# Patient Record
Sex: Female | Born: 1945
Health system: Southern US, Community
[De-identification: ages and names within clinical notes are randomized; demographics above are authoritative.]

## PROBLEM LIST (undated history)

## (undated) DIAGNOSIS — R932 Abnormal findings on diagnostic imaging of liver and biliary tract: Secondary | ICD-10-CM

## (undated) DIAGNOSIS — E559 Vitamin D deficiency, unspecified: Secondary | ICD-10-CM

## (undated) DIAGNOSIS — R740 Nonspecific elevation of levels of transaminase and lactic acid dehydrogenase [LDH]: Secondary | ICD-10-CM

## (undated) DIAGNOSIS — D490 Neoplasm of unspecified behavior of digestive system: Secondary | ICD-10-CM

## (undated) DIAGNOSIS — IMO0001 Reserved for inherently not codable concepts without codable children: Secondary | ICD-10-CM

## (undated) DIAGNOSIS — E119 Type 2 diabetes mellitus without complications: Secondary | ICD-10-CM

## (undated) DIAGNOSIS — R7989 Other specified abnormal findings of blood chemistry: Secondary | ICD-10-CM

## (undated) DIAGNOSIS — R7611 Nonspecific reaction to tuberculin skin test without active tuberculosis: Secondary | ICD-10-CM

## (undated) DIAGNOSIS — E785 Hyperlipidemia, unspecified: Secondary | ICD-10-CM

## (undated) DIAGNOSIS — M1990 Unspecified osteoarthritis, unspecified site: Secondary | ICD-10-CM

## (undated) DIAGNOSIS — E039 Hypothyroidism, unspecified: Secondary | ICD-10-CM

## (undated) DIAGNOSIS — T7840XA Allergy, unspecified, initial encounter: Secondary | ICD-10-CM

## (undated) DIAGNOSIS — K219 Gastro-esophageal reflux disease without esophagitis: Secondary | ICD-10-CM

## (undated) DIAGNOSIS — C801 Malignant (primary) neoplasm, unspecified: Secondary | ICD-10-CM

## (undated) DIAGNOSIS — R945 Abnormal results of liver function studies: Secondary | ICD-10-CM

## (undated) HISTORY — DX: Type 2 diabetes mellitus without complications: E11.9

## (undated) HISTORY — DX: Nonspecific reaction to tuberculin skin test without active tuberculosis: R76.11

## (undated) HISTORY — DX: Malignant (primary) neoplasm, unspecified: C80.1

## (undated) HISTORY — DX: Neoplasm of unspecified behavior of digestive system: D49.0

## (undated) HISTORY — DX: Abnormal results of liver function studies: R94.5

## (undated) HISTORY — DX: Unspecified osteoarthritis, unspecified site: M19.90

## (undated) HISTORY — DX: Hypothyroidism, unspecified: E03.9

## (undated) HISTORY — DX: Vitamin D deficiency, unspecified: E55.9

## (undated) HISTORY — PX: POLYPECTOMY: SHX149

## (undated) HISTORY — DX: Abnormal findings on diagnostic imaging of liver and biliary tract: R93.2

## (undated) HISTORY — DX: Hyperlipidemia, unspecified: E78.5

## (undated) HISTORY — DX: Allergy, unspecified, initial encounter: T78.40XA

## (undated) HISTORY — PX: COLONOSCOPY: SHX174

## (undated) HISTORY — DX: Gastro-esophageal reflux disease without esophagitis: K21.9

## (undated) HISTORY — DX: Reserved for inherently not codable concepts without codable children: IMO0001

## (undated) HISTORY — DX: Nonspecific elevation of levels of transaminase and lactic acid dehydrogenase (ldh): R74.0

## (undated) HISTORY — DX: Other specified abnormal findings of blood chemistry: R79.89

---

## 1994-04-11 DIAGNOSIS — D490 Neoplasm of unspecified behavior of digestive system: Secondary | ICD-10-CM

## 1994-04-11 DIAGNOSIS — C801 Malignant (primary) neoplasm, unspecified: Secondary | ICD-10-CM

## 1994-04-11 HISTORY — DX: Neoplasm of unspecified behavior of digestive system: D49.0

## 1994-04-11 HISTORY — PX: SALIVARY GLAND SURGERY: SHX768

## 1994-04-11 HISTORY — DX: Malignant (primary) neoplasm, unspecified: C80.1

## 2001-03-01 ENCOUNTER — Other Ambulatory Visit: Admission: RE | Admit: 2001-03-01 | Discharge: 2001-03-01 | Payer: Self-pay | Admitting: Internal Medicine

## 2001-03-14 ENCOUNTER — Encounter: Payer: Self-pay | Admitting: Internal Medicine

## 2001-04-24 ENCOUNTER — Encounter: Payer: Self-pay | Admitting: Internal Medicine

## 2002-04-17 ENCOUNTER — Encounter: Payer: Self-pay | Admitting: Internal Medicine

## 2003-04-12 DIAGNOSIS — IMO0001 Reserved for inherently not codable concepts without codable children: Secondary | ICD-10-CM

## 2003-04-12 HISTORY — DX: Reserved for inherently not codable concepts without codable children: IMO0001

## 2003-05-13 ENCOUNTER — Encounter: Payer: Self-pay | Admitting: Internal Medicine

## 2003-05-22 ENCOUNTER — Encounter: Payer: Self-pay | Admitting: Internal Medicine

## 2003-08-06 ENCOUNTER — Encounter: Payer: Self-pay | Admitting: Internal Medicine

## 2003-08-06 ENCOUNTER — Ambulatory Visit (HOSPITAL_COMMUNITY): Admission: RE | Admit: 2003-08-06 | Discharge: 2003-08-06 | Payer: Self-pay | Admitting: Internal Medicine

## 2003-09-02 ENCOUNTER — Encounter: Payer: Self-pay | Admitting: Internal Medicine

## 2003-09-02 ENCOUNTER — Encounter: Admission: RE | Admit: 2003-09-02 | Discharge: 2003-09-02 | Payer: Self-pay | Admitting: Internal Medicine

## 2003-09-02 ENCOUNTER — Encounter (INDEPENDENT_AMBULATORY_CARE_PROVIDER_SITE_OTHER): Payer: Self-pay | Admitting: *Deleted

## 2005-05-12 ENCOUNTER — Ambulatory Visit: Payer: Self-pay | Admitting: Internal Medicine

## 2005-05-17 ENCOUNTER — Ambulatory Visit: Payer: Self-pay | Admitting: Internal Medicine

## 2005-05-24 ENCOUNTER — Ambulatory Visit: Payer: Self-pay | Admitting: Internal Medicine

## 2005-08-15 ENCOUNTER — Ambulatory Visit: Payer: Self-pay | Admitting: Internal Medicine

## 2005-08-22 ENCOUNTER — Ambulatory Visit: Payer: Self-pay | Admitting: Internal Medicine

## 2005-09-02 ENCOUNTER — Ambulatory Visit (HOSPITAL_COMMUNITY): Admission: RE | Admit: 2005-09-02 | Discharge: 2005-09-02 | Payer: Self-pay | Admitting: Internal Medicine

## 2005-10-04 ENCOUNTER — Ambulatory Visit: Payer: Self-pay | Admitting: Internal Medicine

## 2006-02-22 ENCOUNTER — Ambulatory Visit: Payer: Self-pay | Admitting: Internal Medicine

## 2006-02-22 LAB — CONVERTED CEMR LAB
ALT: 28 units/L (ref 0–40)
AST: 25 units/L (ref 0–37)
Albumin: 4.1 g/dL (ref 3.5–5.2)
BUN: 11 mg/dL (ref 6–23)
Basophils Absolute: 0 10*3/uL (ref 0.0–0.1)
Eosinophil percent: 4 % (ref 0.0–5.0)
GFR calc non Af Amer: 78 mL/min
HCT: 45.1 % (ref 36.0–46.0)
Hemoglobin: 15.1 g/dL — ABNORMAL HIGH (ref 12.0–15.0)
Lymphocytes Relative: 33.9 % (ref 12.0–46.0)
MCV: 91.8 fL (ref 78.0–100.0)
Monocytes Absolute: 0.4 10*3/uL (ref 0.2–0.7)
Neutrophils Relative %: 56.1 % (ref 43.0–77.0)
Potassium: 3.6 meq/L (ref 3.5–5.1)
Total Bilirubin: 0.9 mg/dL (ref 0.3–1.2)
WBC: 7 10*3/uL (ref 4.5–10.5)

## 2006-03-21 ENCOUNTER — Ambulatory Visit: Payer: Self-pay | Admitting: Internal Medicine

## 2006-03-28 ENCOUNTER — Encounter: Admission: RE | Admit: 2006-03-28 | Discharge: 2006-06-26 | Payer: Self-pay | Admitting: Internal Medicine

## 2006-06-14 ENCOUNTER — Ambulatory Visit: Payer: Self-pay | Admitting: Internal Medicine

## 2006-06-14 LAB — CONVERTED CEMR LAB
ALT: 33 units/L (ref 0–40)
AST: 33 units/L (ref 0–37)
BUN: 14 mg/dL (ref 6–23)
Basophils Absolute: 0 10*3/uL (ref 0.0–0.1)
Basophils Relative: 0.2 % (ref 0.0–1.0)
Bilirubin, Direct: 0.1 mg/dL (ref 0.0–0.3)
Calcium: 9.3 mg/dL (ref 8.4–10.5)
Creatinine,U: 53.6 mg/dL
GFR calc Af Amer: 468 mL/min
Glucose, Bld: 109 mg/dL — ABNORMAL HIGH (ref 70–99)
HCT: 44.7 % (ref 36.0–46.0)
Lymphocytes Relative: 45.1 % (ref 12.0–46.0)
MCHC: 33.8 g/dL (ref 30.0–36.0)
MCV: 91.1 fL (ref 78.0–100.0)
Microalb Creat Ratio: 11.2 mg/g (ref 0.0–30.0)
Microalb, Ur: 0.6 mg/dL (ref 0.0–1.9)
Monocytes Absolute: 0.3 10*3/uL (ref 0.2–0.7)
Neutro Abs: 2.7 10*3/uL (ref 1.4–7.7)
Neutrophils Relative %: 46.1 % (ref 43.0–77.0)
RBC: 4.91 M/uL (ref 3.87–5.11)
Total Bilirubin: 1.1 mg/dL (ref 0.3–1.2)
WBC: 5.8 10*3/uL (ref 4.5–10.5)

## 2006-06-27 ENCOUNTER — Ambulatory Visit: Payer: Self-pay | Admitting: Internal Medicine

## 2006-09-07 DIAGNOSIS — M1990 Unspecified osteoarthritis, unspecified site: Secondary | ICD-10-CM

## 2006-09-07 DIAGNOSIS — R7989 Other specified abnormal findings of blood chemistry: Secondary | ICD-10-CM | POA: Insufficient documentation

## 2006-09-07 DIAGNOSIS — E785 Hyperlipidemia, unspecified: Secondary | ICD-10-CM | POA: Insufficient documentation

## 2006-09-07 DIAGNOSIS — K219 Gastro-esophageal reflux disease without esophagitis: Secondary | ICD-10-CM

## 2006-09-07 DIAGNOSIS — R945 Abnormal results of liver function studies: Secondary | ICD-10-CM | POA: Insufficient documentation

## 2006-09-07 HISTORY — DX: Other specified abnormal findings of blood chemistry: R79.89

## 2006-09-07 HISTORY — DX: Abnormal results of liver function studies: R94.5

## 2006-09-07 HISTORY — DX: Gastro-esophageal reflux disease without esophagitis: K21.9

## 2006-09-07 HISTORY — DX: Hyperlipidemia, unspecified: E78.5

## 2006-09-07 HISTORY — DX: Unspecified osteoarthritis, unspecified site: M19.90

## 2006-10-23 ENCOUNTER — Ambulatory Visit: Payer: Self-pay | Admitting: Internal Medicine

## 2006-10-23 LAB — CONVERTED CEMR LAB
Direct LDL: 163.6 mg/dL
HDL: 47.9 mg/dL (ref >39.0)
Hgb A1c MFr Bld: 6.3 % — ABNORMAL HIGH (ref 4.6–6.0)
TSH: 4.95 microintl units/mL (ref 0.35–5.50)
VLDL: 34 mg/dL (ref 0–40)

## 2006-10-30 ENCOUNTER — Ambulatory Visit: Payer: Self-pay | Admitting: Internal Medicine

## 2006-10-30 DIAGNOSIS — E039 Hypothyroidism, unspecified: Secondary | ICD-10-CM

## 2006-10-30 HISTORY — DX: Hypothyroidism, unspecified: E03.9

## 2006-11-07 ENCOUNTER — Encounter: Payer: Self-pay | Admitting: Internal Medicine

## 2007-01-23 ENCOUNTER — Ambulatory Visit: Payer: Self-pay | Admitting: Internal Medicine

## 2007-01-23 LAB — CONVERTED CEMR LAB
Cholesterol: 159 mg/dL (ref 0–200)
HDL: 46.6 mg/dL (ref >39.0)
Triglycerides: 107 mg/dL (ref 0–149)
VLDL: 21 mg/dL (ref 0–40)

## 2007-01-30 ENCOUNTER — Telehealth: Payer: Self-pay | Admitting: *Deleted

## 2007-01-30 LAB — CONVERTED CEMR LAB: Vit D, 1,25-Dihydroxy: 9 — ABNORMAL LOW (ref 30–89)

## 2008-02-04 ENCOUNTER — Telehealth: Payer: Self-pay | Admitting: *Deleted

## 2008-07-16 ENCOUNTER — Telehealth: Payer: Self-pay | Admitting: Internal Medicine

## 2008-07-22 ENCOUNTER — Ambulatory Visit: Payer: Self-pay | Admitting: Internal Medicine

## 2008-07-22 DIAGNOSIS — M25549 Pain in joints of unspecified hand: Secondary | ICD-10-CM | POA: Insufficient documentation

## 2008-07-22 LAB — CONVERTED CEMR LAB
ALT: 91 units/L — ABNORMAL HIGH (ref 0–35)
AST: 77 units/L — ABNORMAL HIGH (ref 0–37)
Alkaline Phosphatase: 60 units/L (ref 39–117)
Anti Nuclear Antibody(ANA): NEGATIVE
Basophils Absolute: 0 10*3/uL (ref 0.0–0.1)
Basophils Relative: 0.5 % (ref 0.0–3.0)
CO2: 29 meq/L (ref 19–32)
CRP, High Sensitivity: 7 — ABNORMAL HIGH (ref 0.00–5.00)
Calcium: 9 mg/dL (ref 8.4–10.5)
Chloride: 106 meq/L (ref 96–112)
Cholesterol: 184 mg/dL (ref 0–200)
Creatinine, Ser: 0.6 mg/dL (ref 0.4–1.2)
Cyclic Citrullin Peptide Ab: 0 units (ref <7)
Free T4: 0.8 ng/dL (ref 0.6–1.6)
GFR calc non Af Amer: 107.98 mL/min (ref >60)
Glucose, Bld: 114 mg/dL — ABNORMAL HIGH (ref 70–99)
HCT: 41.4 % (ref 36.0–46.0)
HDL: 36.3 mg/dL — ABNORMAL LOW (ref >39.00)
Hgb A1c MFr Bld: 6.7 % — ABNORMAL HIGH (ref 4.6–6.5)
Lymphocytes Relative: 41.9 % (ref 12.0–46.0)
Monocytes Relative: 8.7 % (ref 3.0–12.0)
Neutro Abs: 1.9 10*3/uL (ref 1.4–7.7)
Platelets: 202 10*3/uL (ref 150.0–400.0)
Potassium: 3.6 meq/L (ref 3.5–5.1)
RDW: 12.3 % (ref 11.5–14.6)
Rhuematoid fact SerPl-aCnc: <20 intl units/mL (ref 0.0–20.0)
Sodium: 140 meq/L (ref 135–145)
TSH: 6.72 microintl units/mL — ABNORMAL HIGH (ref 0.35–5.50)
Total CHOL/HDL Ratio: 5
Total Protein: 6.6 g/dL (ref 6.0–8.3)

## 2008-07-29 ENCOUNTER — Telehealth (INDEPENDENT_AMBULATORY_CARE_PROVIDER_SITE_OTHER): Payer: Self-pay | Admitting: *Deleted

## 2008-08-28 ENCOUNTER — Ambulatory Visit: Payer: Self-pay | Admitting: Internal Medicine

## 2008-08-28 LAB — CONVERTED CEMR LAB
ALT: 67 units/L — ABNORMAL HIGH (ref 0–35)
Total Protein: 7 g/dL (ref 6.0–8.3)

## 2008-09-03 ENCOUNTER — Ambulatory Visit: Payer: Self-pay | Admitting: Internal Medicine

## 2008-09-09 ENCOUNTER — Telehealth: Payer: Self-pay | Admitting: *Deleted

## 2008-09-16 ENCOUNTER — Ambulatory Visit: Payer: Self-pay | Admitting: Internal Medicine

## 2008-09-16 LAB — CONVERTED CEMR LAB
Ceruloplasmin: 25 mg/dL (ref 21–63)
HCV Ab: NEGATIVE
Hep B C IgM: NEGATIVE

## 2008-09-19 ENCOUNTER — Encounter: Admission: RE | Admit: 2008-09-19 | Discharge: 2008-09-19 | Payer: Self-pay | Admitting: Internal Medicine

## 2008-09-25 ENCOUNTER — Telehealth (INDEPENDENT_AMBULATORY_CARE_PROVIDER_SITE_OTHER): Payer: Self-pay | Admitting: *Deleted

## 2008-10-01 LAB — CONVERTED CEMR LAB
AST: 35 units/L (ref 0–37)
Alkaline Phosphatase: 61 units/L (ref 39–117)
Bilirubin, Direct: 0.2 mg/dL (ref 0.0–0.3)
Saturation Ratios: 26.8 % (ref 20.0–50.0)
Total Bilirubin: 0.7 mg/dL (ref 0.3–1.2)

## 2008-11-14 ENCOUNTER — Ambulatory Visit: Payer: Self-pay | Admitting: Internal Medicine

## 2008-11-14 DIAGNOSIS — R932 Abnormal findings on diagnostic imaging of liver and biliary tract: Secondary | ICD-10-CM | POA: Insufficient documentation

## 2008-11-14 DIAGNOSIS — R7401 Elevation of levels of liver transaminase levels: Secondary | ICD-10-CM | POA: Insufficient documentation

## 2008-11-14 DIAGNOSIS — R740 Nonspecific elevation of levels of transaminase and lactic acid dehydrogenase [LDH]: Secondary | ICD-10-CM

## 2008-11-14 DIAGNOSIS — R7402 Elevation of levels of lactic acid dehydrogenase (LDH): Secondary | ICD-10-CM

## 2008-11-14 HISTORY — DX: Elevation of levels of liver transaminase levels: R74.01

## 2008-11-14 HISTORY — DX: Abnormal findings on diagnostic imaging of liver and biliary tract: R93.2

## 2008-11-14 HISTORY — DX: Elevation of levels of lactic acid dehydrogenase (LDH): R74.02

## 2008-11-14 LAB — CONVERTED CEMR LAB
Anti Nuclear Antibody(ANA): NEGATIVE
Bilirubin, Direct: 0.1 mg/dL (ref 0.0–0.3)
Hepatitis B Surface Ag: NEGATIVE
Tissue Transglutaminase Ab, IgA: 0.4 units (ref <7)

## 2008-12-08 ENCOUNTER — Telehealth: Payer: Self-pay | Admitting: Internal Medicine

## 2009-01-05 ENCOUNTER — Ambulatory Visit: Payer: Self-pay | Admitting: Internal Medicine

## 2009-06-22 ENCOUNTER — Telehealth: Payer: Self-pay | Admitting: *Deleted

## 2009-08-07 ENCOUNTER — Ambulatory Visit: Payer: Self-pay | Admitting: Internal Medicine

## 2009-08-07 LAB — CONVERTED CEMR LAB
ALT: 33 units/L (ref 0–35)
Albumin: 4.4 g/dL (ref 3.5–5.2)
BUN: 12 mg/dL (ref 6–23)
CO2: 31 meq/L (ref 19–32)
Calcium: 9.7 mg/dL (ref 8.4–10.5)
Chloride: 106 meq/L (ref 96–112)
Cholesterol: 148 mg/dL (ref 0–200)
Creatinine, Ser: 0.6 mg/dL (ref 0.4–1.2)
Glucose, Bld: 116 mg/dL — ABNORMAL HIGH (ref 70–99)
HCT: 43.9 % (ref 36.0–46.0)
Hgb A1c MFr Bld: 6.7 % — ABNORMAL HIGH (ref 4.6–6.5)
Lymphocytes Relative: 33.5 % (ref 12.0–46.0)
Lymphs Abs: 2.2 10*3/uL (ref 0.7–4.0)
Monocytes Relative: 5.9 % (ref 3.0–12.0)
Neutro Abs: 3.6 10*3/uL (ref 1.4–7.7)
Neutrophils Relative %: 55.3 % (ref 43.0–77.0)
Platelets: 244 10*3/uL (ref 150.0–400.0)
RBC: 4.74 M/uL (ref 3.87–5.11)
RDW: 13.4 % (ref 11.5–14.6)
Total Bilirubin: 0.7 mg/dL (ref 0.3–1.2)
Total CHOL/HDL Ratio: 3
Triglycerides: 103 mg/dL (ref 0.0–149.0)

## 2009-08-14 ENCOUNTER — Ambulatory Visit: Payer: Self-pay | Admitting: Internal Medicine

## 2009-08-14 DIAGNOSIS — E559 Vitamin D deficiency, unspecified: Secondary | ICD-10-CM | POA: Insufficient documentation

## 2009-08-14 DIAGNOSIS — H1045 Other chronic allergic conjunctivitis: Secondary | ICD-10-CM | POA: Insufficient documentation

## 2009-08-14 HISTORY — DX: Vitamin D deficiency, unspecified: E55.9

## 2009-08-18 ENCOUNTER — Encounter: Payer: Self-pay | Admitting: Internal Medicine

## 2009-08-18 ENCOUNTER — Ambulatory Visit: Payer: Self-pay | Admitting: Family Medicine

## 2009-09-08 ENCOUNTER — Telehealth: Payer: Self-pay | Admitting: *Deleted

## 2009-09-28 ENCOUNTER — Telehealth: Payer: Self-pay | Admitting: *Deleted

## 2010-02-12 ENCOUNTER — Ambulatory Visit: Payer: Self-pay | Admitting: Internal Medicine

## 2010-02-12 LAB — CONVERTED CEMR LAB: Vit D, 25-Hydroxy: 24 ng/mL — ABNORMAL LOW (ref 30–89)

## 2010-02-19 ENCOUNTER — Ambulatory Visit: Payer: Self-pay | Admitting: Internal Medicine

## 2010-02-19 DIAGNOSIS — R42 Dizziness and giddiness: Secondary | ICD-10-CM | POA: Insufficient documentation

## 2010-02-19 DIAGNOSIS — M542 Cervicalgia: Secondary | ICD-10-CM | POA: Insufficient documentation

## 2010-02-19 LAB — CONVERTED CEMR LAB: Blood Glucose, Fingerstick: 184

## 2010-04-14 ENCOUNTER — Telehealth: Payer: Self-pay | Admitting: *Deleted

## 2010-05-10 ENCOUNTER — Encounter: Payer: Self-pay | Admitting: Internal Medicine

## 2010-05-13 NOTE — Progress Notes (Signed)
Summary: Rx for OTC Vit d for Flex spending  Phone Note Call from Patient Call back at Home Phone [phone]   Caller: Patient Summary of Call: Pt wants [place] to do a rx for OTC vit d so she can get in on her flex spending account. [facility]. Initial call taken by: Romualdo Bolk, CMA Duncan Dull),  Sep 08, 2009 9:43 AM  Follow-up for Phone Call        vitamin d 1000 international units per day disp 90 refill x 3  Follow-up by: Madelin Headings MD,  September 09, 2009 8:55 AM  Additional Follow-up for Phone Call Additional follow up Details #1::        [facility] [address] Pt aware of this. Additional Follow-up by: Romualdo Bolk, CMA (AAMA),  September 09, 2009 8:57 AM    New/Updated Medications: VITAMIN D 1000 UNIT TABS (CHOLECALCIFEROL) 1 by mouth once daily Prescriptions: VITAMIN D 1000 UNIT TABS (CHOLECALCIFEROL) 1 by mouth once daily  #90 x 3   Entered by:   Romualdo Bolk, CMA (AAMA)   Authorized by:   Madelin Headings MD   Signed by:   Romualdo Bolk, CMA (AAMA) on 09/09/2009   Method used:   [facility]        [facility] [phone] ([facility]ail[facility]       [address]       [place], [place]  [address]       Ph: [phone] or [phone]       Fax: [phone]   RxID:   [id number]

## 2010-05-13 NOTE — Assessment & Plan Note (Signed)
Summary: 6 month rov/njr//FLU SHOT//ALP   Vital Signs:  Patient profile:   65 year old female Menstrual status:  postmenopausal Height:      58.5 inches Weight:      137 pounds BMI:     28.25 Temp:     98.6 degrees F oral Pulse rate:   80 / minute Pulse rhythm:   regular BP sitting:   108 / 74  (left arm) Cuff size:   regular  Vitals Entered By: Kern Reap CMA Duncan Dull) (February 19, 2010 10:55 AM)  Nutrition Counseling: Patient's BMI is greater than 25 and therefore counseled on weight management options. CC: follow-up visit, flu vaccine Is Patient Diabetic? No Pain Assessment Patient in pain? no      CBG Result 184   History of Present Illness: Debbie Marshall comes in today  for follow up of multiple medical problems. Since last visit  here  there have been no major changes in health status   however, recently she had an episode where she experienced some dizziness and occurred when she turned over in the middle of the night. She noted that it was better when she laid on a given side. there were some had a convolve, but this was her regular headache.   she also has ongoing right neck pain that she is described before that she feels is from sitting in front of her workstation.   better when change position.  some  radiation HA   onto the right sidewith this.  remote hx of head scan  evaluation.  was ok .  there's no change in vision or other neurologic symptoms. Her dizziness is now better. She's had no fever URI or allergy symptoms. No falling change in speech or weakness.  she's just begun to rent exercise, which may be helping her neck.    Preventive Screening-Counseling & Management  Alcohol-Tobacco     Alcohol drinks/day: 0     Smoking Status: never  Allergies: No Known Drug Allergies  Past History:  Past medical, surgical, family and social histories (including risk factors) reviewed, and no changes noted (except as noted below).  Past Medical  History: Reviewed history from 09/03/2008 and no changes required. Hyperlipidemia Osteoarthritis GERD Positive PPD G0 Hypothyroidism Abnormal lfts in paper record  2005 that resolved  and [place]  fatty liver  Neg stress cardiolite 2005  Colonscopy 2005  Marina Goodell   Past Surgical History: Reviewed history from 09/03/2008 and no changes required. 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 Family History Hypertension Family History of Stroke F 1st degree relative <60 mom 64 RA bro Neice also RA  MOM  Arthritis.  NO fam hx of liver disease No FH of Colon Cancer:  Social History: Reviewed history from 08/14/2009 and no changes required. Occupation: phd professor Never Smoked Alcohol use-no Illicit Drug Use - no Patient gets regular exercise. Hhof 2     father and her  no pets   Review of Systems  The patient denies anorexia, fever, weight loss, vision loss, decreased hearing, hoarseness, chest pain, syncope, dyspnea on exertion, peripheral edema, prolonged cough, hemoptysis, melena, hematochezia, severe indigestion/heartburn, abnormal bleeding, enlarged lymph nodes, and angioedema.         Flu Vaccine Consent Questions     Do you have a history of severe allergic reactions to this vaccine? no    Any prior history of allergic reactions to egg  and/or gelatin? no    Do you have a sensitivity to the preservative Thimersol? no    Do you have a past history of Guillan-Barre Syndrome? no    Do you currently have an acute febrile illness? no    Have you ever had a severe reaction to latex? no    Vaccine information given and explained to patient? yes    Are you currently pregnant? no    Lot Number:[id number]   Exp Date:10/09/2010   Site Given  Left Deltoid IM   Physical Exam  General:  Well-developed,well-nourished,in no acute distress; alert,appropriate and cooperative throughout  examination Head:  normocephalic and atraumatic.   Eyes:  PERRL, EOMs full, conjunctiva clear  Ears:  R ear normal, L ear normal, and no external deformities.   Nose:  no external deformity and no nasal discharge.   Mouth:  pharynx pink and moist.  tongue midline Neck:  no masses.  no bruits  Lungs:  Normal respiratory effort, chest expands symmetrically. Lungs are clear to auscultation, no crackles or wheezes. Heart:  Normal rate and regular rhythm. S1 and S2 normal without gallop, murmur, click, rub or other extra sounds. Abdomen:  Bowel sounds positive,abdomen soft and non-tender without masses, organomegaly or hernias noted. Pulses:  pulses intact without delay   Extremities:  no clubbing cyanosis or edema  Neurologic:  non focal alert & oriented X3, strength normal in all extremities, and gait normal.   nornal balance  Skin:  turgor normal, color normal, no ecchymoses, and no petechiae.   Cervical Nodes:  No lymphadenopathy noted Psych:  Oriented X3, good eye contact, and not anxious appearing.     Impression & Recommendations:  Problem # 1:  FASTING HYPERGLYCEMIA (ICD-790.6) Assessment Deteriorated today her sugar is elevated, two hours after a carbohydrate meal. Discussed lifestyle intervention and because of significant change in her A-1 C    7.5 to a diabetic type range will add metformin  and. Risk-benefit of medicine discussed.  Problem # 2:  VITAMIN D DEFICIENCY (ICD-268.9) slightly better taking oral medication level in the 20s  Problem # 3:  VERTIGO (ICD-780.4) this appears to be positional and resolved with no other neurological alarm findings. Discussed this with patient reevaluate if recurring  Problem # 4:  NECK PAIN, RIGHT (ICD-723.1) she's had this for a while. This is probably positional or mild DJD and she may be having server ergogenic headaches. I do recommend her exercise as we discussed . Further evaluation not needed at this time.  Problem # 5:   HYPOTHYROIDISM (ICD-244.9) no change of medication Her updated medication list for this problem includes:    Synthroid 75 Mcg Tabs (Levothyroxine sodium) .Marland Kitchen... 1 by mouth once daily  Labs Reviewed: TSH: 1.87 (02/12/2010)    HgBA1c: 7.5 (02/12/2010) Chol: 148 (08/07/2009)   HDL: 51.00 (08/07/2009)   LDL: 76 (08/07/2009)   TG: 103.0 (08/07/2009)  Complete Medication List: 1)  Simvastatin 40 Mg Tabs (Simvastatin) .... Once daily 2)  Synthroid 75 Mcg Tabs (Levothyroxine sodium) .Marland Kitchen.. 1 by mouth once daily 3)  Vitamin D 1000 Unit Tabs (Cholecalciferol) .Marland Kitchen.. 1 by mouth once daily 4)  Metformin Hcl 500 Mg Xr24h-tab (Metformin hcl) .Marland Kitchen.. 1 by mouth once daily  with meal  Other Orders: Admin 1st Vaccine (16109) Flu Vaccine 44yrs + (60454) Capillary Blood Glucose/CBG (09811)  Patient Instructions: 1)  Tthis sounds like positional vertigo . Call if getting persistent or  progressive   2)  You may have cervicogenic headache  and could be from you neck.  3)  Agree with increasing exercise as this can help your blood sugar control also. 4)  Add the metformin for blood sugar control. 5)  Preventive visit  appt in 3 -4months     with  lab and HG a1c  dx hyperglycemia .  Prescriptions: METFORMIN HCL 500 MG XR24H-TAB (METFORMIN HCL) 1 by mouth once daily  with meal  #30 x 3   Entered and Authorized by:   Madelin Headings MD   Signed by:   Madelin Headings MD on 02/19/2010   Method used:   [facility]        [phone][facility]ail[facility]       [address]       [place], [place]  [address]       Ph: [phone] or [phone]       Fax: [phone]   RxID:   [id number]    Orders Added: 1)  Admin 1st Vaccine [90471] 2)  Flu Vaccine 19yrs + [36644] 3)  Capillary Blood Glucose/CBG [82948] 4)  Est. Patient Level IV [03474]

## 2010-05-13 NOTE — Assessment & Plan Note (Signed)
Summary: FOLLOW UP/LABS/NS/CB   Vital Signs:  Patient profile:   65 year old female Menstrual status:  postmenopausal Weight:      136 pounds Pulse rate:   78 / minute BP sitting:   110 / 70  (right arm) Cuff size:   regular  Vitals Entered By: Romualdo Bolk, CMA (AAMA) (Aug 14, 2009 10:49 AM) CC: Follow-up visit on labs   History of Present Illness: Debbie Marshall comes in for follow up of multiple medical problems . Since last visit  here  there have been no major changes in health status  .  BG:  some sweets and white rice  .  no change in vision numbness or weakness  LIPIDs no se of meds  Thyroid : missed meds ocassionally  Gi stable  stil nasnt had colonscopy  HCm  doesnt want pap Hx of low vit d level  used to be on rx suppl   Preventive Screening-Counseling & Management  Alcohol-Tobacco     Alcohol drinks/day: 0     Smoking Status: never  Caffeine-Diet-Exercise     Caffeine use/day: 2     Does Patient Exercise: yes  Current Medications (verified): 1)  Simvastatin 40 Mg Tabs (Simvastatin) .... Once Daily 2)  Synthroid 75 Mcg Tabs (Levothyroxine Sodium) .Marland Kitchen.. 1 By Mouth Once Daily  Allergies (verified): No Known Drug Allergies  Past History:  Past medical, surgical, family and social histories (including risk factors) reviewed, and no changes noted (except as noted below).  Past Medical History: Reviewed history from 09/03/2008 and no changes required. Hyperlipidemia Osteoarthritis GERD Positive PPD G0 Hypothyroidism Abnormal lfts in paper record  2005 that resolved  and [place]  fatty liver  Neg stress cardiolite 2005  Colonscopy 2005  Marina Goodell   Past Surgical History: Reviewed history from 09/03/2008 and no changes required. 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 Family History Hypertension Family History of Stroke  F 1st degree relative <60 mom 64 RA bro Neice also RA  MOM  Arthritis.  NO fam hx of liver disease No FH of Colon Cancer:  Social History: Reviewed history from 11/14/2008 and no changes required. Occupation: phd professor Never Smoked Alcohol use-no Illicit Drug Use - no Patient gets regular exercise. Hhof 2     father and her  no pets   Review of Systems  The patient denies anorexia, fever, weight loss, weight gain, vision loss, decreased hearing, chest pain, syncope, dyspnea on exertion, peripheral edema, prolonged cough, headaches, hemoptysis, abdominal pain, melena, hematochezia, severe indigestion/heartburn, hematuria, muscle weakness, difficulty walking, depression, abnormal bleeding, enlarged lymph nodes, and angioedema.         itchy eyes and allergy signs  antihitamine a bit of help  Physical Exam  General:  Well-developed,well-nourished,in no acute distress; alert,appropriate and cooperative throughout examination Head:  normocephalic and atraumatic.   Eyes:  mild injection right more than left  Ears:  R ear normal, L ear normal, and no external deformities.   Nose:  no external deformity and no external erythema.  congeted and no face tenderness  Mouth:  pharynx pink and moist.   Neck:  No deformities, masses, or tenderness noted. Lungs:  normal respiratory effort, no intercostal retractions, no accessory muscle use, normal breath sounds, and no dullness.   Heart:  normal rate, regular rhythm, no murmur, no gallop, and no lifts.  Abdomen:  Bowel sounds positive,abdomen soft and non-tender without masses, organomegaly or  noted. Msk:  no joint swelling and no joint warmth.   Pulses:  pulses intact without delay   Extremities:  no clubbing cyanosis or edema  Neurologic:  nonfocal  Skin:  turgor normal, color normal, and no ecchymoses.   Cervical Nodes:  No lymphadenopathy noted Psych:  Normal eye contact, appropriate affect. Cognition appears normal.   will check  on Dexa if due and order before then.  Impression & Recommendations:  Problem # 1:  HYPOTHYROIDISM (ICD-244.9)  Her updated medication list for this problem includes:    Synthroid 75 Mcg Tabs (Levothyroxine sodium) .Marland Kitchen... 1 by mouth once daily  Labs Reviewed: TSH: 3.74 (08/07/2009)    HgBA1c: 6.7 (08/07/2009) Chol: 148 (08/07/2009)   HDL: 51.00 (08/07/2009)   LDL: 76 (08/07/2009)   TG: 103.0 (08/07/2009)  Problem # 2:  FASTING HYPERGLYCEMIA (ICD-790.6) Assessment: Unchanged counseled   prediabetic    consider adding metformin  want s to do lifestyle intervention   and follow up   Problem # 3:  HYPERLIPIDEMIA (ICD-272.4)  Her updated medication list for this problem includes:    Simvastatin 40 Mg Tabs (Simvastatin) ..... Once daily  Labs Reviewed: SGOT: 35 (08/07/2009)   SGPT: 33 (08/07/2009)   HDL:51.00 (08/07/2009), 36.30 (07/22/2008)  LDL:76 (08/07/2009), 128 (04/54/0981)  Chol:148 (08/07/2009), 184 (07/22/2008)  Trig:103.0 (08/07/2009), 99.0 (07/22/2008)  Problem # 4:  CONJUNCTIVITIS, ALLERGIC (ICD-372.14) Assessment: New add pataday  antihistamine call as needed   Problem # 5:  VITAMIN D DEFICIENCY (ICD-268.9) prev level 9  do otc supp and follow up   Problem # 6:  ABNORMAL TRANSAMINASE, (LFT'S) (ICD-790.4) Assessment: Improved nl today  see evlautation per GI  Complete Medication List: 1)  Simvastatin 40 Mg Tabs (Simvastatin) .... Once daily 2)  Synthroid 75 Mcg Tabs (Levothyroxine sodium) .Marland Kitchen.. 1 by mouth once daily 3)  Pataday 0.2 % Soln (Olopatadine hcl) .Marland Kitchen.. 1-2 drops once daily or two times a day  for eye allergy  Patient Instructions: 1)  can use   allergy eye drops .Antihistamine such as zyrtec and allegra claritin ok also  2)  Take   vit d 1000 international units  per day  3)  maintain daily thyroid relacement. 4)  Avoid sugars and simple carbs and increase exercise  5)  TSH prior to visit ICD-9 :  6)  HgBA1c prior to visit  ICD-9:  7)  Vitamin D :   dx  vit d deficiency  8)  Get mammo gram  9)  Please schedule a follow-up appointment in 6 months .  Prescriptions: PATADAY 0.2 % SOLN (OLOPATADINE HCL) 1-2 drops once daily or two times a day  for eye allergy  #1 x 1   Entered and Authorized by:   Madelin Headings MD   Signed by:   Madelin Headings MD on 08/14/2009   Method used:   Print then Give to Patient   RxID:   [id number] SYNTHROID 75 MCG TABS (LEVOTHYROXINE SODIUM) 1 by mouth once daily Brand medically necessary #90 x 3   Entered and Authorized by:   Madelin Headings MD   Signed by:   Madelin Headings MD on 08/14/2009   Method used:   Print then Give to Patient   RxID:   [id number] SIMVASTATIN 40 MG TABS (SIMVASTATIN) once daily  #90 x 3   Entered and Authorized by:   Madelin Headings MD   Signed by:  Madelin Headings MD on 08/14/2009   Method used:   Print then Give to Patient   RxID:   [id number]   Appended Document: FOLLOW UP/LABS/NS/CB Tell patient that  I reviewed record and she is due for dexa scan and please schedule this .  dx  menopause and hypothyroid.  Appended Document: FOLLOW UP/LABS/NS/CB Left message to call back.  Appended Document: FOLLOW UP/LABS/NS/CB Pt aware and appt made.

## 2010-05-13 NOTE — Miscellaneous (Signed)
Summary: Orders Update  Clinical Lists Changes  Orders: Added new Test order of T-Bone Densitometry (77080) - Signed Added new Test order of T-Lumbar Vertebral Assessment (77082) - Signed 

## 2010-05-13 NOTE — Progress Notes (Signed)
Summary: refills sent to [facility]  Phone Note From Pharmacy   Caller: [facility] Reason for Call: Needs renewal Details for Reason: l-thyroxine and simvastatin  Summary of Call: Pt has never had rx's filled from [facility] [place] before. I called her to make sure that this was okay. I spoke to pt and she said it was okay to do this. Initial call taken by: Romualdo Bolk, CMA Duncan Dull),  September 28, 2009 11:45 AM  Follow-up for Phone Call        Rx sent to pharmacy. Follow-up by: Romualdo Bolk, CMA (AAMA),  September 28, 2009 11:49 AM    Prescriptions: SYNTHROID 75 MCG TABS (LEVOTHYROXINE SODIUM) 1 by mouth once daily Brand medically necessary #90 x 3   Entered by:   Romualdo Bolk, CMA (AAMA)   Authorized by:   Madelin Headings MD   Signed by:   Romualdo Bolk, CMA (AAMA) on 09/28/2009   Method used:   Faxed to ...       [facility] (mail-order)             , [place]         Ph:        Fax: [phone]   RxID:   [id number] SIMVASTATIN 40 MG TABS (SIMVASTATIN) once daily  #90 x 3   Entered by:   Romualdo Bolk, CMA (AAMA)   Authorized by:   Madelin Headings MD   Signed by:   Romualdo Bolk, CMA (AAMA) on 09/28/2009   Method used:   Faxed to ...       [facility] (mail-order)             , [place]         Ph:        Fax: [phone]   RxID:   [id number]

## 2010-05-13 NOTE — Progress Notes (Signed)
Summary: REFILL  Phone Note Refill Request Message from:  Fax from Pharmacy  Refills Requested: Medication #1:  SIMVASTATIN 40 MG TABS once daily [facility] [facility][phone]     FAX---[phone]  Initial call taken by: Warnell Forester,  June 22, 2009 12:19 PM    Prescriptions: SIMVASTATIN 40 MG TABS (SIMVASTATIN) once daily  #30 x 0   Entered by:   Romualdo Bolk, CMA (AAMA)   Authorized by:   Madelin Headings MD   Signed by:   Romualdo Bolk, CMA (AAMA) on 06/22/2009   Method used:   [facility]ron[facility] to        [facility] [phone][facility] (retail)       [address]       [place], [place]  [address]       Ph: [phone] or [phone]       Fax: [phone]   RxID:   [id number]   Appended Document: REFILL by past notes we have been trying to "get in touch" with this patient  when she called for her synthroid refill.   She is due for labs . I dont see  communication  that tells her she needs labs and ROV   in this refill note . Contact the patient phone or mail or via pharmacy about this  communicating with patient and scheduling the patient for lab and follow up  .   No more refills with out a lab and appt scheduled.   Appended Document: REFILL Pt has an appt on 4/29 for a follow up on meds. What type of labs would you like her to have prior to her appt.  Appended Document: REFILL please have her get LIPIDs,LFTS, TSH,BMP and  CBC and diff  .  dx 244.9 272.4   If not time  before visit can do this at her visit.   Appended Document: REFILL LMTOCB  Appended Document: REFILL Pt aware and will call back to schedule the appt.

## 2010-05-13 NOTE — Progress Notes (Signed)
Summary: refills  Phone Note From Pharmacy   Caller: [facility] Summary of Call: Rx for freestyle lite test strips and lancets Initial call taken by: Romualdo Bolk, CMA (AAMA),  April 14, 2010 10:41 AM  Follow-up for Phone Call        Rx sent to pt Follow-up by: Romualdo Bolk, CMA (AAMA),  April 14, 2010 10:42 AM    New/Updated Medications: FREESTYLE LITE TEST  STRP (GLUCOSE BLOOD) test as directed FREESTYLE UNISTICK II LANCETS  MISC (LANCETS) use as directed Prescriptions: FREESTYLE UNISTICK II LANCETS  MISC (LANCETS) use as directed  #100 x 1   Entered by:   Romualdo Bolk, CMA (AAMA)   Authorized by:   Madelin Headings MD   Signed by:   Romualdo Bolk, CMA (AAMA) on 04/14/2010   Method used:   [facility] to        [facility] [phone][facility] (retail)       [address]       [place], [place]  [address]       Ph: [phone] or [phone]       Fax: [phone]   RxID:   [id number] FREESTYLE LITE TEST  STRP (GLUCOSE BLOOD) test as directed  #100 x 1   Entered by:   Romualdo Bolk, CMA (AAMA)   Authorized by:   Madelin Headings MD   Signed by:   Romualdo Bolk, CMA (AAMA) on 04/14/2010   Method used:   [facility]ronically to        [facility] [phone][facility] (retail)       [address]       [place], [place]  [address]       Ph: [phone] or [phone]       Fax: [phone]   RxID:   [id number]

## 2010-05-17 ENCOUNTER — Telehealth: Payer: Self-pay | Admitting: *Deleted

## 2010-05-17 DIAGNOSIS — R7301 Impaired fasting glucose: Secondary | ICD-10-CM

## 2010-05-17 DIAGNOSIS — E039 Hypothyroidism, unspecified: Secondary | ICD-10-CM

## 2010-05-17 DIAGNOSIS — E785 Hyperlipidemia, unspecified: Secondary | ICD-10-CM

## 2010-05-17 MED ORDER — SIMVASTATIN 40 MG PO TABS: 40.0000 mg | ORAL_TABLET | Freq: Every day | ORAL | Status: DC

## 2010-05-17 MED ORDER — METFORMIN HCL ER 500 MG PO TB24: 500.0000 mg | ORAL_TABLET | Freq: Every day | ORAL | Status: DC

## 2010-05-17 MED ORDER — LEVOTHYROXINE SODIUM 75 MCG PO TABS: 75.0000 ug | ORAL_TABLET | Freq: Every day | ORAL | Status: DC

## 2010-05-17 NOTE — Telephone Encounter (Signed)
Rx sent to pharmacy   

## 2010-05-17 NOTE — Telephone Encounter (Signed)
Pt called saying that she needs refills sent to [facility] for 90 days for her thyroid , cholesterol and dm medications.

## 2010-05-24 ENCOUNTER — Other Ambulatory Visit: Payer: Medicare HMO | Admitting: Internal Medicine

## 2010-05-24 DIAGNOSIS — E039 Hypothyroidism, unspecified: Secondary | ICD-10-CM

## 2010-05-24 DIAGNOSIS — E119 Type 2 diabetes mellitus without complications: Secondary | ICD-10-CM

## 2010-05-24 DIAGNOSIS — Z0000 Encounter for general adult medical examination without abnormal findings: Secondary | ICD-10-CM

## 2010-05-24 DIAGNOSIS — E785 Hyperlipidemia, unspecified: Secondary | ICD-10-CM

## 2010-05-24 LAB — CBC WITH DIFFERENTIAL/PLATELET
Basophils Relative: 0.5 % (ref 0.0–3.0)
Eosinophils Relative: 3 % (ref 0.0–5.0)
Lymphocytes Relative: 32.8 % (ref 12.0–46.0)
MCHC: 34.1 g/dL (ref 30.0–36.0)
MCV: 90.9 fl (ref 78.0–100.0)
Monocytes Relative: 3.8 % (ref 3.0–12.0)
Neutro Abs: 4 10*3/uL (ref 1.4–7.7)
Neutrophils Relative %: 59.9 % (ref 43.0–77.0)
Platelets: 229 10*3/uL (ref 150.0–400.0)
RBC: 4.59 Mil/uL (ref 3.87–5.11)

## 2010-05-24 LAB — BASIC METABOLIC PANEL
GFR: 116.23 mL/min (ref >60.00)
Glucose, Bld: 102 mg/dL — ABNORMAL HIGH (ref 70–99)
Potassium: 4.1 mEq/L (ref 3.5–5.1)
Sodium: 141 mEq/L (ref 135–145)

## 2010-05-24 LAB — POCT URINALYSIS DIPSTICK
Bilirubin, UA: NEGATIVE
Ketones, UA: NEGATIVE
Protein, UA: NEGATIVE
Spec Grav, UA: 1.025
Urobilinogen, UA: 0.2
pH, UA: 6

## 2010-05-24 LAB — LIPID PANEL
HDL: 43.2 mg/dL (ref >39.00)
Total CHOL/HDL Ratio: 3

## 2010-05-24 LAB — HEPATIC FUNCTION PANEL
AST: 27 U/L (ref 0–37)
Alkaline Phosphatase: 52 U/L (ref 39–117)
Total Bilirubin: 0.6 mg/dL (ref 0.3–1.2)

## 2010-05-31 ENCOUNTER — Ambulatory Visit (INDEPENDENT_AMBULATORY_CARE_PROVIDER_SITE_OTHER): Payer: Medicare HMO | Admitting: Internal Medicine

## 2010-05-31 ENCOUNTER — Encounter: Payer: Self-pay | Admitting: Internal Medicine

## 2010-05-31 VITALS — BP 130/80 | HR 72 | Ht 59.0 in | Wt 130.0 lb

## 2010-05-31 DIAGNOSIS — K219 Gastro-esophageal reflux disease without esophagitis: Secondary | ICD-10-CM

## 2010-05-31 DIAGNOSIS — R7401 Elevation of levels of liver transaminase levels: Secondary | ICD-10-CM

## 2010-05-31 DIAGNOSIS — M1990 Unspecified osteoarthritis, unspecified site: Secondary | ICD-10-CM

## 2010-05-31 DIAGNOSIS — E119 Type 2 diabetes mellitus without complications: Secondary | ICD-10-CM

## 2010-05-31 DIAGNOSIS — IMO0002 Reserved for concepts with insufficient information to code with codable children: Secondary | ICD-10-CM

## 2010-05-31 DIAGNOSIS — R945 Abnormal results of liver function studies: Secondary | ICD-10-CM

## 2010-05-31 DIAGNOSIS — E039 Hypothyroidism, unspecified: Secondary | ICD-10-CM

## 2010-05-31 DIAGNOSIS — R7611 Nonspecific reaction to tuberculin skin test without active tuberculosis: Secondary | ICD-10-CM

## 2010-05-31 DIAGNOSIS — E785 Hyperlipidemia, unspecified: Secondary | ICD-10-CM

## 2010-05-31 DIAGNOSIS — Z0000 Encounter for general adult medical examination without abnormal findings: Secondary | ICD-10-CM

## 2010-05-31 DIAGNOSIS — H8110 Benign paroxysmal vertigo, unspecified ear: Secondary | ICD-10-CM

## 2010-05-31 NOTE — Progress Notes (Signed)
Subjective:    Patient ID: Marland Kitchen, female    DOB: 1946/01/15, 65 y.o.   MRN: [id number]  HPI  patient comes in today for preventive visit. Since her last visit with she has had no major changes in her health. She has been exercising and eating healthier and has lost some weight. She has no side effects to metformin or her other medications. DM: no vision change neurologic symptoms.  No hypoglycemia.  Thyroid;  Taking medications regularly.  Hyperlipidemia; no side effects of medications.   Review of Systems Dizziness  Laying in one position  And then  Resolves. This appears to be postural and intermittent no otherwise weakness numbness vision changes. Negative chest pain shortness of breath bleeding skin changes GI or GU changes. Rest of ROS negative or noncontributory. No history of falling. Past Medical History  Diagnosis Date  . VITAMIN D DEFICIENCY 08/14/2009  . ABNORMAL TRANSAMINASE, (LFT'S) 11/14/2008  . FASTING HYPERGLYCEMIA 09/07/2006  . ABNORMAL EXAM-BILIARY TRACT 11/14/2008  . Nonspecific abnormal results of liver function study 09/07/2006  . OSTEOARTHRITIS 09/07/2006  . GERD 09/07/2006  . HYPERLIPIDEMIA 09/07/2006  . HYPOTHYROIDISM 10/30/2006   Past Surgical History  Procedure Date  . 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  reports that she has never smoked. She does not have any smokeless tobacco history on file. She reports that she drinks about .6 ounces of alcohol per week. She reports that she does not use illicit drugs. family history includes Arthritis in her brother and mother; Gout in her brother; Rheum arthritis in her brother; and Stroke (age of onset:64) in her mother.        Objective:   Physical Exam Wt Readings from Last 3 Encounters:  05/31/10 130 lb (58.968 kg)  02/19/10 137 lb (62.143 kg)  08/14/09 136 lb (61.689 kg)  Physical Exam: Vital signs  reviewed [id number] is a well-developed well-nourished alert cooperative  white female who appears her stated age in no acute distress.  HEENT: normocephalic  traumatic , Eyes: PERRL EOM's full, conjunctiva clear, Nares: paten,t no deformity discharge or tenderness., Ears: no deformity EAC's clear TMs with normal landmarks. Mouth: clear OP, no lesions, edema.  Moist mucous membranes. Dentition in adequate repair. NECK: supple without masses, thyromegaly or bruits. CHEST/PULM:  Clear to auscultation and percussion breath sounds equal no wheeze , rales or rhonchi. No chest wall deformities or tenderness. CV: PMI is nondisplaced, S1 S2 no gallops, murmurs, rubs. Peripheral pulses are full without delay.No JVD .  BREAST:  No masses nodules her discharge. ABDOMEN: Bowel sounds normal nontender  No guard or rebound, no hepato splenomegal no CVA tenderness.  No hernia. Extremtities:  No clubbing cyanosis or edema, no acute joint swelling or redness no focal atrophy.  Normal perfusion and toenails. NEURO:  Oriented x3, cranial nerves 3-12 appear to be intact, no obvious focal weakness,gait within normal limits no abnormal reflexes or asymmetrical sensation on feet are within normal limits no calluses forefoot deformities.  SKIN: No acute rashes normal turgor, color, no bruising or petechiae. PSYCH: Oriented, good eye contact, no obvious depression anxiety, cognition and judgment appear normal.  laboratory reviewed with patient.  EKG within normal limits       Assessment & Plan:   preventive visit. Wants to hold off on Tdap  Today declined Pap smear ; is low risk.  DM  Improved with  lifestyle intervention  Hypothyroid  Hyperlipidemia  Abnormal liver tests resolved  Vertigo positional intermittent this sounds like benign positional vertigo that is only occasional discussed alarm symptoms and physical therapy interventions if persistent and progressive. No signs of vascular cause.

## 2010-06-02 ENCOUNTER — Encounter: Payer: Self-pay | Admitting: Internal Medicine

## 2010-06-02 DIAGNOSIS — Z0000 Encounter for general adult medical examination without abnormal findings: Secondary | ICD-10-CM | POA: Insufficient documentation

## 2010-06-02 DIAGNOSIS — R7611 Nonspecific reaction to tuberculin skin test without active tuberculosis: Secondary | ICD-10-CM | POA: Insufficient documentation

## 2010-06-02 DIAGNOSIS — E119 Type 2 diabetes mellitus without complications: Secondary | ICD-10-CM | POA: Insufficient documentation

## 2010-06-02 DIAGNOSIS — IMO0002 Reserved for concepts with insufficient information to code with codable children: Secondary | ICD-10-CM | POA: Insufficient documentation

## 2010-06-02 NOTE — Assessment & Plan Note (Signed)
No change in medication today

## 2010-06-02 NOTE — Assessment & Plan Note (Signed)
Hemoglobin A1c 6.6 today continue lifestyle intervention and metformin plan followup in 4 months other there a bouts.

## 2010-06-02 NOTE — Assessment & Plan Note (Addendum)
This is been better since she has lost weight.

## 2010-06-02 NOTE — Assessment & Plan Note (Signed)
Labs are no longer in the diabetic range continue the metformin and lifestyle intervention this is a major difference in her parameters.

## 2010-06-02 NOTE — Assessment & Plan Note (Signed)
No side effects of medication lifestyle intervention

## 2010-06-24 ENCOUNTER — Other Ambulatory Visit: Payer: Self-pay | Admitting: Internal Medicine

## 2010-09-08 ENCOUNTER — Telehealth: Payer: Self-pay | Admitting: *Deleted

## 2010-09-08 DIAGNOSIS — R7301 Impaired fasting glucose: Secondary | ICD-10-CM

## 2010-09-08 MED ORDER — METFORMIN HCL ER 500 MG PO TB24: 500.0000 mg | ORAL_TABLET | Freq: Every day | ORAL | Status: DC

## 2010-09-08 MED ORDER — SIMVASTATIN 40 MG PO TABS: 40.0000 mg | ORAL_TABLET | Freq: Every day | ORAL | Status: DC

## 2010-09-08 NOTE — Telephone Encounter (Signed)
Refills on glucophage xr and zocor 40. Rx sent to pharmacy.

## 2010-09-28 ENCOUNTER — Other Ambulatory Visit (INDEPENDENT_AMBULATORY_CARE_PROVIDER_SITE_OTHER): Payer: Medicare HMO

## 2010-09-28 DIAGNOSIS — E119 Type 2 diabetes mellitus without complications: Secondary | ICD-10-CM

## 2010-09-28 LAB — HEMOGLOBIN A1C: Hgb A1c MFr Bld: 6.7 % — ABNORMAL HIGH (ref 4.6–6.5)

## 2010-10-05 ENCOUNTER — Encounter: Payer: Self-pay | Admitting: Internal Medicine

## 2010-10-05 ENCOUNTER — Ambulatory Visit (INDEPENDENT_AMBULATORY_CARE_PROVIDER_SITE_OTHER): Payer: Medicare HMO | Admitting: Internal Medicine

## 2010-10-05 VITALS — BP 120/80 | HR 72 | Wt 130.0 lb

## 2010-10-05 DIAGNOSIS — M79671 Pain in right foot: Secondary | ICD-10-CM | POA: Insufficient documentation

## 2010-10-05 DIAGNOSIS — M129 Arthropathy, unspecified: Secondary | ICD-10-CM

## 2010-10-05 DIAGNOSIS — E119 Type 2 diabetes mellitus without complications: Secondary | ICD-10-CM

## 2010-10-05 DIAGNOSIS — M79609 Pain in unspecified limb: Secondary | ICD-10-CM

## 2010-10-05 DIAGNOSIS — M1990 Unspecified osteoarthritis, unspecified site: Secondary | ICD-10-CM | POA: Insufficient documentation

## 2010-10-05 LAB — CBC WITH DIFFERENTIAL/PLATELET
Basophils Relative: 0.9 % (ref 0.0–3.0)
Eosinophils Relative: 3.7 % (ref 0.0–5.0)
Lymphocytes Relative: 41 % (ref 12.0–46.0)
MCV: 92 fl (ref 78.0–100.0)
Monocytes Absolute: 0.3 10*3/uL (ref 0.1–1.0)
Monocytes Relative: 4.9 % (ref 3.0–12.0)
Neutrophils Relative %: 49.5 % (ref 43.0–77.0)
Platelets: 218 10*3/uL (ref 150.0–400.0)
RBC: 4.52 Mil/uL (ref 3.87–5.11)
WBC: 5.6 10*3/uL (ref 4.5–10.5)

## 2010-10-05 LAB — SEDIMENTATION RATE: Sed Rate: 6 mm/hr (ref 0–22)

## 2010-10-05 NOTE — Patient Instructions (Signed)
Intensify lifestyle intervention for blood sugar control. Can consider increasing metformin if needed. Get x-ray of hands and right foot and will notify you of these results as well as the laboratory tests. If they are abnormal we can consider getting rheumatologic consult.  On exam your hands appear to have osteoarthritis.

## 2010-10-05 NOTE — Progress Notes (Signed)
  Subjective:    Patient ID: Debbie Marshall, female    DOB: 11/04/1945, 65 y.o.   MRN: [id number]  HPI Patient comes in today for  multiple medical issues  DM: taking metformin 500 without se .   Some lsi could be better  Concerned about continuing and progressing am hand and  joint stiffness and changes.   No pain meds regularly . Also right foot pain without trauma or swelling . No redness Remote  Rheumatologic  eval  And at that time was felt not to be  RA    But now worse.   NO cp sob fever sweats weight loss  Review of Systems No bleeding unusual rashes cough see h pi   Past history family history social history reviewed in the electronic medical record. Past Medical History  Diagnosis Date  . VITAMIN D DEFICIENCY 08/14/2009  . ABNORMAL TRANSAMINASE, (LFT'S) 11/14/2008     2005 US shows fatty liver  . FASTING HYPERGLYCEMIA 09/07/2006  . ABNORMAL EXAM-BILIARY TRACT 11/14/2008  . Nonspecific abnormal results of liver function study 09/07/2006  . OSTEOARTHRITIS 09/07/2006  . GERD 09/07/2006  . HYPERLIPIDEMIA 09/07/2006  . HYPOTHYROIDISM 10/30/2006  . Positive PPD   . Normal nuclear stress test 2005    stress cardiolite   . 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 She does not have any smokeless tobacco history on file. She reports that she drinks about .6 ounces of alcohol per week. She reports that she does not use illicit drugs. family history includes Arthritis in her brother and mother; Gout in her brother; Rheum arthritis in her brother; and Stroke (age of onset:64) in her mother. No Known Allergies      Objective:   Physical Exam Wd wn in nad  EXT MS hands with distal dip nodular changes with some swelling no redness or other deformity. No acute   Rashes Chest quiet respirations CV rr  No  clubbing cyanosis or edema   Labs reviewed  Lab Results  Component Value Date   HGBA1C 6.7* 09/28/2010  other labs reviewed       Assessment & Plan:  DM about the same   Disc options and she wishes to  Intensify lifestyle interventions.  and not yet increase the metformin for now  Arthritis with am stiffness although appears to be OA like.  Has family hx of autoimmune disease and arthritis.   Total visit > 50% spent counseling and coordinating care

## 2010-10-06 LAB — CYCLIC CITRUL PEPTIDE ANTIBODY, IGG: Cyclic Citrullin Peptide Ab: <2 U/mL (ref 0.0–5.0)

## 2010-10-06 LAB — SJOGRENS SYNDROME-A EXTRACTABLE NUCLEAR ANTIBODY: SSA (Ro) (ENA) Antibody, IgG: 9 AU/mL (ref <30)

## 2010-10-07 ENCOUNTER — Encounter: Payer: Self-pay | Admitting: Internal Medicine

## 2010-10-08 ENCOUNTER — Encounter: Payer: Self-pay | Admitting: *Deleted

## 2010-10-09 ENCOUNTER — Encounter: Payer: Self-pay | Admitting: Internal Medicine

## 2010-10-12 ENCOUNTER — Ambulatory Visit (INDEPENDENT_AMBULATORY_CARE_PROVIDER_SITE_OTHER)
Admission: RE | Admit: 2010-10-12 | Discharge: 2010-10-12 | Disposition: A | Discharge status: Home / Self Care | Payer: Medicare HMO | Source: Ambulatory Visit | Attending: Internal Medicine | Admitting: Internal Medicine

## 2010-10-12 DIAGNOSIS — M1990 Unspecified osteoarthritis, unspecified site: Secondary | ICD-10-CM

## 2010-10-12 DIAGNOSIS — M79609 Pain in unspecified limb: Secondary | ICD-10-CM

## 2010-10-12 DIAGNOSIS — M79671 Pain in right foot: Secondary | ICD-10-CM

## 2010-10-12 DIAGNOSIS — M129 Arthropathy, unspecified: Secondary | ICD-10-CM

## 2010-10-14 ENCOUNTER — Telehealth: Payer: Self-pay | Admitting: *Deleted

## 2010-10-14 DIAGNOSIS — M7989 Other specified soft tissue disorders: Secondary | ICD-10-CM

## 2010-10-14 NOTE — Telephone Encounter (Signed)
Pt is still having swelling in the am. What would you like her to do for this?

## 2010-10-14 NOTE — Progress Notes (Signed)
Pt aware of results 

## 2010-10-15 NOTE — Telephone Encounter (Signed)
Left message to call back  

## 2010-10-15 NOTE — Telephone Encounter (Signed)
We can do rheumatology opinion if she is aggreeable.  Dr Dareen Piano, Nickola Major etc.

## 2010-10-18 NOTE — Telephone Encounter (Signed)
Left message for pt to call back  °

## 2010-10-19 NOTE — Telephone Encounter (Signed)
Pt aware of this and wants to go ahead with rheum appt.

## 2010-11-03 ENCOUNTER — Other Ambulatory Visit: Payer: Self-pay | Admitting: Internal Medicine

## 2010-11-03 DIAGNOSIS — Z1231 Encounter for screening mammogram for malignant neoplasm of breast: Secondary | ICD-10-CM

## 2010-11-11 ENCOUNTER — Ambulatory Visit (HOSPITAL_COMMUNITY)
Admission: RE | Admit: 2010-11-11 | Discharge: 2010-11-11 | Disposition: A | Discharge status: Home / Self Care | Payer: Medicare HMO | Source: Ambulatory Visit | Attending: Internal Medicine | Admitting: Internal Medicine

## 2010-11-11 DIAGNOSIS — Z1231 Encounter for screening mammogram for malignant neoplasm of breast: Secondary | ICD-10-CM | POA: Insufficient documentation

## 2010-12-03 ENCOUNTER — Other Ambulatory Visit: Payer: Self-pay | Admitting: Internal Medicine

## 2010-12-03 DIAGNOSIS — E039 Hypothyroidism, unspecified: Secondary | ICD-10-CM

## 2010-12-03 DIAGNOSIS — R7301 Impaired fasting glucose: Secondary | ICD-10-CM

## 2010-12-03 NOTE — Telephone Encounter (Signed)
Pt req refills of metFORMIN (GLUCOPHAGE-XR) 500 MG ,SYNTHROID 75 MCG tablet,simvastatin (ZOCOR) 40 MG tablet to [facility] on [address].

## 2010-12-06 MED ORDER — SIMVASTATIN 40 MG PO TABS: 40.0000 mg | ORAL_TABLET | Freq: Every day | ORAL | Status: DC

## 2010-12-06 MED ORDER — METFORMIN HCL ER 500 MG PO TB24: 500.0000 mg | ORAL_TABLET | Freq: Every day | ORAL | Status: DC

## 2010-12-06 MED ORDER — SYNTHROID 75 MCG PO TABS: 75.0000 ug | ORAL_TABLET | Freq: Every day | ORAL | Status: DC

## 2010-12-06 NOTE — Telephone Encounter (Signed)
Rx sent to pharmacy   

## 2010-12-14 ENCOUNTER — Telehealth: Payer: Self-pay | Admitting: *Deleted

## 2010-12-14 NOTE — Telephone Encounter (Signed)
Left on machine to call back.

## 2010-12-14 NOTE — Telephone Encounter (Signed)
Pt would like to change from brand to generic synthroid. Please advise on this.

## 2010-12-14 NOTE — Telephone Encounter (Signed)
She may change to a generic levothyroxine but it is not recommended.  Due to the possible inconsistent bioavailability of a given generic,and  cost  Difference should be minimal.  However  She could do well on this   And it is ok to change to 75 mcg or levothyroxine 1 po qd  Disp 30 refill x 3 and then we need to check tsh in 2-3 months   After beginning change .

## 2010-12-20 NOTE — Telephone Encounter (Signed)
Left message to call back  

## 2010-12-28 NOTE — Telephone Encounter (Signed)
Pt never called back.

## 2011-05-05 ENCOUNTER — Telehealth: Payer: Self-pay | Admitting: Internal Medicine

## 2011-05-05 ENCOUNTER — Other Ambulatory Visit: Payer: Self-pay | Admitting: Internal Medicine

## 2011-05-05 MED ORDER — SYNTHROID 75 MCG PO TABS: 75.0000 ug | ORAL_TABLET | Freq: Every day | ORAL | Status: DC

## 2011-05-05 NOTE — Telephone Encounter (Signed)
rx sent in electronically 

## 2011-05-05 NOTE — Telephone Encounter (Signed)
Refill Synthroid 75mg  x 3 months  to [facility]. Thanks.

## 2011-08-15 ENCOUNTER — Other Ambulatory Visit: Payer: Self-pay | Admitting: Internal Medicine

## 2011-10-12 ENCOUNTER — Telehealth: Payer: Self-pay | Admitting: Internal Medicine

## 2011-10-12 NOTE — Telephone Encounter (Signed)
Caller: Debbie Marshall/Patient; PCP: Madelin Headings.; CB#: [phone]; ; ; Call regarding Pain in Hands, Elbow, Joints;  Has noticed stiff joints in hands in the mornings then gradual onset of numbness with pain in Rt little and ring fingers that radiates to Rt elbow in the mornings.  Some tenderness over outer elbow joint.   Feels swollen in the morning but does not look swollen.  Has been gardening a lot for the last 4 weeks, mowing lawn.  Sx decrease during the day.  Afebrile.  Did take Advil 2 pills once and that completely relieved the pain for awhile. Triaged in Elbow Non-Injury Guideline - Disposition:  See Provider Within 72 Hours due to tenderness, pain over outside elbow not previously evaluated, not improving after 2 weeks.  Has appt already scheduled for Fri 7/5. Gave care information for elbow pain until seen.

## 2011-10-14 ENCOUNTER — Encounter: Payer: Self-pay | Admitting: Internal Medicine

## 2011-10-14 ENCOUNTER — Ambulatory Visit (INDEPENDENT_AMBULATORY_CARE_PROVIDER_SITE_OTHER): Payer: Medicare HMO | Admitting: Internal Medicine

## 2011-10-14 VITALS — BP 134/84 | HR 68 | Temp 97.5°F | Wt 127.0 lb

## 2011-10-14 DIAGNOSIS — R209 Unspecified disturbances of skin sensation: Secondary | ICD-10-CM

## 2011-10-14 DIAGNOSIS — G5621 Lesion of ulnar nerve, right upper limb: Secondary | ICD-10-CM | POA: Insufficient documentation

## 2011-10-14 DIAGNOSIS — M79601 Pain in right arm: Secondary | ICD-10-CM

## 2011-10-14 DIAGNOSIS — E785 Hyperlipidemia, unspecified: Secondary | ICD-10-CM

## 2011-10-14 DIAGNOSIS — M79609 Pain in unspecified limb: Secondary | ICD-10-CM

## 2011-10-14 DIAGNOSIS — E559 Vitamin D deficiency, unspecified: Secondary | ICD-10-CM

## 2011-10-14 DIAGNOSIS — E039 Hypothyroidism, unspecified: Secondary | ICD-10-CM

## 2011-10-14 DIAGNOSIS — R200 Anesthesia of skin: Secondary | ICD-10-CM

## 2011-10-14 DIAGNOSIS — E119 Type 2 diabetes mellitus without complications: Secondary | ICD-10-CM

## 2011-10-14 LAB — CBC WITH DIFFERENTIAL/PLATELET
Basophils Relative: 0.7 % (ref 0.0–3.0)
Eosinophils Relative: 5.2 % — ABNORMAL HIGH (ref 0.0–5.0)
HCT: 44.5 % (ref 36.0–46.0)
Hemoglobin: 15 g/dL (ref 12.0–15.0)
Lymphs Abs: 2.3 10*3/uL (ref 0.7–4.0)
Monocytes Relative: 4.9 % (ref 3.0–12.0)
Neutro Abs: 3.5 10*3/uL (ref 1.4–7.7)
RBC: 4.89 Mil/uL (ref 3.87–5.11)
RDW: 12.9 % (ref 11.5–14.6)
WBC: 6.5 10*3/uL (ref 4.5–10.5)

## 2011-10-14 LAB — LIPID PANEL
HDL: 52.2 mg/dL (ref >39.00)
Total CHOL/HDL Ratio: 3
Triglycerides: 100 mg/dL (ref 0.0–149.0)
VLDL: 20 mg/dL (ref 0.0–40.0)

## 2011-10-14 LAB — HEPATIC FUNCTION PANEL
ALT: 25 U/L (ref 0–35)
AST: 31 U/L (ref 0–37)
Albumin: 4.4 g/dL (ref 3.5–5.2)
Alkaline Phosphatase: 61 U/L (ref 39–117)

## 2011-10-14 LAB — BASIC METABOLIC PANEL
GFR: 120.67 mL/min (ref >60.00)
Glucose, Bld: 107 mg/dL — ABNORMAL HIGH (ref 70–99)
Potassium: 4.6 mEq/L (ref 3.5–5.1)
Sodium: 142 mEq/L (ref 135–145)

## 2011-10-14 LAB — TSH: TSH: 2.51 u[IU]/mL (ref 0.35–5.50)

## 2011-10-14 LAB — C-REACTIVE PROTEIN: CRP: <1 mg/dL (ref 1–20)

## 2011-10-14 MED ORDER — CHOLECALCIFEROL 25 MCG (1000 UT) PO CAPS: 1000.0000 [IU] | ORAL_CAPSULE | Freq: Every day | ORAL | Status: DC

## 2011-10-14 NOTE — Progress Notes (Signed)
Subjective:    Patient ID: Debbie Marshall Kitchen, female    DOB: 12-11-1945, 66 y.o.   MRN: [id number]  HPI  Patient comes in today for SDA for  new problem evaluation. Has baseline hand arthritis sx ( see last year eval)  But new sx  Onset after gardening. And using  lawnmower about 10 days ago.   Numbness of fingertips  Mostly middle fingers lateral   right and then radiation  up to elbow and shoulder   Is right handed. 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 Pain with holding and gripping on right    Has used advil once.. Swelling.  Took tylenol as  advised instead  By on triage nursing   Since this has rested it is feeling better but concerned  About the new onset sx and evaluation .    Update of other med conditions DM last check a year ago  BG habe been good in the 90 - 110 range and no lows novision changes other sx. Thyroid: same meds taking  Due for tsh .  LIPIDS:  Taking med no reported se.  Vit d  : now on otc 1000 iu  hasnt had check since off of the high dose med.  Asks for rx for flex spending. Review of Systems No constitutional sx CP sob LE edema cough of sig . Sig gerd sx.   Past history family history social history reviewed in the electronic medical record.  Outpatient Encounter Prescriptions as of 10/14/2011  Medication Sig Dispense Refill  . Cholecalciferol 1000 UNITS capsule Take 1 capsule (1,000 Units total) by mouth daily.  90 capsule  3  . FreeStyle Unistick II Lancets MISC by Does not apply route.        Debbie Marshall Kitchen glucose blood (FREESTYLE LITE) test strip 1 each by Other route as needed. Use as instructed       . metFORMIN (GLUCOPHAGE-XR) 500 MG 24 hr tablet TAKE 1 TABLET BY MOUTH ONCE A DAY WITH BREAKFAST  90 tablet  0  . Omega-3 Fatty Acids (FISH OIL) 1000 MG CAPS Take by mouth.        . simvastatin (ZOCOR) 40 MG tablet TAKE 1 TABLET BY MOUTH DAILY AT BEDTIME  90 tablet  0  . SYNTHROID 75 MCG tablet TAKE 1 TABLET BY MOUTH ONCE A DAY  30 tablet  0  . DISCONTD:  Cholecalciferol (VITAMIN D3) 1000 UNIT capsule Take 1,000 Units by mouth daily.            Objective:   Physical Exam BP 134/84  Pulse 68  Temp 97.5 F (36.4 C) (Oral)  Wt 127 lb (57.607 kg)  SpO2 98% WDWN in nad looks well. Wt Readings from Last 3 Encounters:  10/14/11 127 lb (57.607 kg)  10/05/10 130 lb (58.968 kg)  05/31/10 130 lb (58.968 kg)   Neck: Supple without adenopathy or masses or bruits no [place] ln or plexus tenderness Chest:  Clear to A&P without wheezes rales or rhonchi CV:  S1-S2 no gallops or murmurs peripheral perfusion is normal Abdomen:  Sof,t normal bowel sounds without hepatosplenomegaly, no guarding rebound or masses no CVA tenderness No clubbing cyanosis or edema RIGHT UE  Mild OA changes hand grip 5/5 no atrophy pulses intact  Reports problem  Elbow no swelling but  Tender at ulnar notch area rom good shoulder full rom  .     Assessment & Plan:  Right hand numbness discomfort  Seems mechanical poss overuse   ?  Ulnar distribution    Options discusssed agree with relative rest and can do short term nsaid . 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 Refer to Sports medicine  Dr Guilford Shi al  For eval and management plan.   Underlying arthralgias  Suspect  From OA   See labs from last year.  DM no recent FU but seems controlled. Hypothyroid  Needs tsh done LIPIDS;  Due for lab check . Hx of vit d defic now on otc supp  Check level today and send in rx

## 2011-10-14 NOTE — Patient Instructions (Addendum)
We will arrange a referral to sports medicine.  I agree that this acts like a mechanical overuse injury.. There could be ulnar neuritis involved. If your kidney function is normal which I suspect it is you can take an ibuprofen 400-600 mg 3 times a day as needed for the pain for a week. If your symptoms of arthritis or persistent or progressive consider rheumatology consult.  Will inform you when you're lab reports are back. Then plan followup  Repetitive Strain Injuries Repetitive strain injuries (RSIs) are now the single largest cause of job related (occupational) health problems in the [place] RSIs can occur in any job that requires repetitive, forceful, or awkward motions. Repetitive strain injuries are a group of health problems that result from several causes. These include overuse or misuse of muscles, cord-like structures attaching muscle to bone (tendons), and nerves. CAUSES  Job-related RSIs are caused by any combination of the following factors:  Fast pace (working quickly).   Repetitive tasks (making the same motion over and over).   Awkward or fixed posture (working in an awkward position or holding the same position for a long time).   Forceful movements (lifting, pulling, or pushing).   Vibration (caused by power tools).   Working in cold temperatures.   Job stress (such as monitoring).   Inadequate rest breaks (overuse).  RSIs develop over time and are also called cumulative trauma disorders (CTDs). Repetitive strain injuries have other names, too. These include:  Occupational overuse syndrome.   Repetitive motion disorders.   Repetitive damage caused by an accident (trauma) disorders (RTDs).  RSIs can affect almost any part of the body. But they often occur in the upper body. The most commonly affected body parts are:   Fingers.   Elbows.   Hands.   Shoulders.   Wrists.   Back.   Arms.   Neck.  RISK INCREASES WITH: You may be at risk for developing  an RSI, if you:  Have poor posture.   Have poor technique.   Use a computer more than two to four hours a day.   Have a job that requires constant computer use.   Do not take frequent breaks.   Are loose-jointed.   Do not exercise regularly.   Work in a high pressure environment.   Have arthritis, diabetes, or another serious medical condition.   Keep your fingernails long.   Have an unhealthy, stressful, or inactive lifestyle.   Weigh more than you should.   Do not sleep well.   Are unwilling to ask for a better work setting, chair, desk, etc.   Are macho, and don't believe you are at risk when you really are, or think that you can just "take it."  SYMPTOMS  These problems (symptoms) may appear in any order, and at any stage in the development of the injury. Symptoms may occur at any time: during work, right after work, or many hours or days after work. Many people first experience symptoms when they are not working. For example, an injured worker may have no pain at work, but may wake up at night with a painful shoulder or elbow. The most common symptoms are:  Burning, shooting, or aching pain, especially in the fingers, palms, wrists, forearms, or shoulders.   Tenderness.   Swelling.   Tingling, numbness, or loss of feeling.   Loss of joint mobility (difficulty moving the wrist or elbow, for example).   Weakness, heaviness, or loss of coordination in the hand (for  example, difficulty opening a jar top).   Crackling.   Muscle spasms.   Decreased coordination, or clumsiness (for example, dropping things often).   Avoiding the use of one hand or arm, because it is painful, and preferring the other.   Difficulty doing simple things like handling keys, chopping food, putting on jewelry, writing, or brushing teeth.  Any jobs that require strain and repetition pose a risk of RSI. There are many different repetitive strain injuries, since many different parts of the  body can be affected. RSI symptoms can be mild. But they can become so intense that it becomes difficult to perform everyday tasks. These tasks include opening a jar or fastening a button. In general, the more intense and frequent the symptoms, the more serious the RSI is likely to be. A serious RSI can develop only months after symptoms first appear, or it could take years to develop.  Most RSIs are work-related. But RSIs can be caused by activities outside of work, such as sports and hobbies. Older people are more vulnerable than younger ones to RSIs. This is because the body's ability to repair the effects of wear and tear decreases with age.  If you think your repetitive strain disorder is getting worse, or you are developing one, see your caregiver for advice. Often, if treated early, the amount and length of disability can be shortened. HOME CARE INSTRUCTIONS  If your caregiver prescribed medicine to help reduce swelling, take as directed.   If you were given a splint to keep your wrist from bending (such as for carpal tunnel syndrome), use it as instructed. It is important to wear the splint at night. Use splints for as long as your caregiver recommends.   It is important to give your injury a rest by stopping the activities that are causing the problem. If your symptoms are work-related, you may need to talk to your employer about changing to a job that does not require using your injured part.   Only take over-the-counter or prescription medicines for pain, discomfort, or fever as directed by your caregiver.   Following periods of extended use, especially strenuous use, apply an ice pack wrapped in a towel to the affected (sore) area for 15 to 20 minutes. Repeat as needed, three to four times per day. This will help reduce the swelling.   Call your caregiver if you develop new, unexplained symptoms or problems that are not responding to medicines.  When non-surgical treatment does not help,  surgery may be required. Non-surgical treatment could include:  Changes in the activity that caused the problem or made it worse.   Medicines to stop the swelling and soreness (anti-inflammatory medications).   Injections such as cortisone, to decrease the inflammation and soreness.  Your caregiver will help you determine which is best for you. PREVENTION An RSI can take months, even years to develop, and it can take longer to heal. But there are ways to prevent RSIs, including:  Maintain good posture at your desk or work station with:   Feet flat on the floor.   Knees directly over feet, bent at right angles (or slightly greater), a couple inches of space away from the chair.   Pelvis rocked forward, sitting on the "sitz bones," with hips no lower than, and perhaps slightly higher than the knees.   Lower back arched in, and supported by your chair, or a towel/foam roll wedged against your back.   Upper back naturally rounded.   Shoulders  and arms relaxed, and at your side.   Neck arched in, relaxed, and supported by your spine. Do not hold tension in your back or under your chin.   Head balancing gently on top of your spine.   A good quality, adjustable chair with a firm seat.   Set up your work station well, so that it reduces strain. Make sure your:   Keyboard is above your thighs. You are able to reach the keys, with your elbows at your side and bent at (slightly greater than) 90 degrees, and your forearms are about parallel to the ground.   Mouse is just to one side of your keyboard. You do not need to lean, stretch, or hunch to work it. Many people have one shoulder lower than the other. This can be caused by repetitive stretching for a mouse.   Monitor is directly in front of you (not off to the side), so that your eyes are somewhere between the top of the screen and one fifth of the way down from the top. The screen should be about 15 to 25 inches from your eyes.   Take  breaks often, at least once an hour. Get up, walk around, and stretch.   Exercise regularly.   Only use your computer as much as you need to for work. Do not use it during breaks.   Do not hold your pen tightly when writing. It should be possible to pull the pen from your fingers, easily.   Let your hands float above the keyboard when you type. Move your entire arm when moving your mouse or typing hard-to-reach keys. Always keep your wrist joint straight. This lets the big muscles in your arm, shoulder, and back do the work, instead of the smaller and weaker ones in your hand and wrist.  Document Released: 03/18/2002 Document Revised: 03/17/2011 Document Reviewed: 02/02/2009 [facility] Patient Information 2012 [place], [place].

## 2011-10-15 ENCOUNTER — Encounter: Payer: Self-pay | Admitting: Internal Medicine

## 2011-10-18 ENCOUNTER — Other Ambulatory Visit: Payer: Self-pay | Admitting: Family Medicine

## 2011-10-18 DIAGNOSIS — E119 Type 2 diabetes mellitus without complications: Secondary | ICD-10-CM

## 2011-10-18 DIAGNOSIS — E559 Vitamin D deficiency, unspecified: Secondary | ICD-10-CM

## 2011-11-10 ENCOUNTER — Ambulatory Visit (INDEPENDENT_AMBULATORY_CARE_PROVIDER_SITE_OTHER): Payer: Medicare HMO | Admitting: Sports Medicine

## 2011-11-10 VITALS — BP 124/70 | Ht 59.0 in | Wt 127.0 lb

## 2011-11-10 DIAGNOSIS — G5621 Lesion of ulnar nerve, right upper limb: Secondary | ICD-10-CM

## 2011-11-10 DIAGNOSIS — G5620 Lesion of ulnar nerve, unspecified upper limb: Secondary | ICD-10-CM

## 2011-11-10 DIAGNOSIS — M19019 Primary osteoarthritis, unspecified shoulder: Secondary | ICD-10-CM | POA: Insufficient documentation

## 2011-11-10 DIAGNOSIS — S335XXA Sprain of ligaments of lumbar spine, initial encounter: Secondary | ICD-10-CM

## 2011-11-10 DIAGNOSIS — S39012A Strain of muscle, fascia and tendon of lower back, initial encounter: Secondary | ICD-10-CM | POA: Insufficient documentation

## 2011-11-10 MED ORDER — DICLOFENAC SODIUM 1.5 % TD SOLN: 7.0000 [drp] | Freq: Three times a day (TID) | TRANSDERMAL | Status: DC

## 2011-11-10 NOTE — Assessment & Plan Note (Signed)
Patient has mild bilateral a.c. Arthritis.  This causes some pain intermittently.  Discussed several treatment options including oral anti-inflammatory pain medicines as needed and topical NSAIDs.   She elects for prescription of Pennsaid plus oral ibuprofen with her next flair.  Cautioned against heavy overhead shoulder lifting or pushups.  Otherwise she is cleared to perform any activity she wishes

## 2011-11-10 NOTE — Assessment & Plan Note (Signed)
Patient has spontaneously resolving mild cubital tunnel syndrome on the right.  Her ulnar nerve volume of 1.57 cm squared correlates to mild cubital tunnel syndrome.   Discussed several treatment options including night bracing using a towel wrapped around her elbow, versus an elbow compression sleeve when gardening.  She will try the  towel approach and will followup if her symptoms return

## 2011-11-10 NOTE — Progress Notes (Signed)
Debbie Marshall is a 66 y.o. female who presents to [facility] today for  1) right fourth and fifth digit numbness and tingling associated with elbow pain.  Present for about 3 weeks however resolved spontaneously prior to coming in today.  Patient notes that this tends to be worse after sleeping it was exacerbated by gardening. Over-the-counter pain medicines were not helpful. Denies any current loss of sensation weakness numbness or discoordination. Has not had this issue before.  2) low back pain: Intermittent present for about 3 weeks improving today.  Pain localized to the right gluteus/SI region.  Nonradiating not associated with difficulty walking or bladder dysfunction loss of sensation or weakness.  This tends to occur every once in a while for the last 20 years.  No weight loss fevers or chills  3) bilateral anterior shoulder pain: Present for the last few weeks however also improving.  Worse with sleeping at night. No radiating pain to the lateral shoulder or down into the arm.  No neck pain.  No pain with overhand activity lifting or pulling.      PMH reviewed. Significant for prediabetes hypercholesterolemia and hypothyroidism Family history is noncontributory History  Substance Use Topics  . Smoking status: Never Smoker   . Smokeless tobacco: Not on file  . 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  no weight loss fevers or chills nausea vomiting constipation trouble breathing palpitations hot or cold intolerance swelling, lumps or bumps.    Exam:  BP 124/70  Ht 4\' 11"  (1.499 m)  Wt 127 lb (57.607 kg)  BMI 25.65 kg/m2 Gen: Well NAD HEENT: Moist mucous membranes Lungs: Clear to auscultation bilaterally normal work of breathing Heart: Regular rate and rhythm no murmurs rubs or gallops Abdomen: Normal active bowel sounds  Neck: Nontender over spinal midline normal range of motion  negative Spurling's test  Back: Nontender over spinal midline. Mildly tender over the right SI joint and right gluteus. Normal range of motion. Negative straight leg raises are negative Pearlean Brownie tests bilaterally.  Shoulders: Normal to inspection. Mildly tender over bilateral a.c. joints right worse than left.  Range of motion: Full in all planes bilaterally Strength: 5/5 in all planes bilaterally Impingement: Negative Hawkins and Neer tests Compression: Positive crossover arm test bilateral Labrum: Negative O'Brien test bilaterally  Elbow: Normal range of motion to flexion extension pronation and supination. Strength intact. Nontender or lateral epicondyles.  Mildly tender over the cubital tunnel.   Hand: Normal strength motion and sensation.  Capillary refills and pulses are intact.   Special tests: No numbness and tingling with prolonged elbow flexion and negative Tinel's over the cubital tunnel.   MSK ultrasound: Cubital tunnel: Ulnar nerve measures 1.57 centimeter squared in in volume.  No subluxation on dynamic elbow flexion.  Right shoulder limited: A.c. joint shows effusion with positive mushroomed sign.  No significant calcification within the joint.  Supraspinatus: Normal in appearance on longitudinal and transverse views no tears.  Biceps tendon: Intact and in groove without significant halo sign.

## 2011-11-10 NOTE — Assessment & Plan Note (Signed)
Patient has a mild improving lumbar strain.   She has no physical signs or symptoms concerning for malignancy or serious cord compression.   Advised core strengthening exercises and provided a handout. We'll followup as needed.

## 2011-11-10 NOTE — Patient Instructions (Addendum)
Thank you for coming in today. 1) Back: You have a mild low back strain. Most people will have this 2x a year and will last 2 weeks.  Do the exercises to help strengthen your core muscles.  2) Elbow: This is cubital tunnel syndrome.  It is compression of the ulnar nerve in the elbow.  You have a mild case which is good.  Consider the towel wrap at night if you have symptoms.  Also consider an elbow sleeve with gardening.  3) Shoulder: You have arthritis of the [facility] joint.  If it bothers you take up to 3 ibuprofen every 8 hours.  Also consider filling the Pennsaid drops for your shoulder. Apply every 4-8 hours as needed for pain.   Come back as needed.

## 2011-11-29 ENCOUNTER — Telehealth: Payer: Self-pay | Admitting: Internal Medicine

## 2011-11-29 ENCOUNTER — Other Ambulatory Visit: Payer: Self-pay | Admitting: Internal Medicine

## 2011-11-29 DIAGNOSIS — Z1231 Encounter for screening mammogram for malignant neoplasm of breast: Secondary | ICD-10-CM

## 2011-11-29 NOTE — Telephone Encounter (Signed)
Patient called stating that she would like to be referred to [facility] for a bone density. Please assist.

## 2011-11-30 ENCOUNTER — Other Ambulatory Visit: Payer: Self-pay | Admitting: Family Medicine

## 2011-11-30 DIAGNOSIS — M1990 Unspecified osteoarthritis, unspecified site: Secondary | ICD-10-CM

## 2011-11-30 NOTE — Telephone Encounter (Signed)
Order placed in the system. Scheduling will follow up to make appt.

## 2011-12-13 ENCOUNTER — Other Ambulatory Visit: Payer: Self-pay | Admitting: Internal Medicine

## 2011-12-14 ENCOUNTER — Telehealth: Payer: Self-pay | Admitting: Internal Medicine

## 2011-12-14 NOTE — Telephone Encounter (Signed)
Pt called and said that she is getting Mammogram at [facility] tomorrow morning and is req to get Bone Density done there as well.

## 2011-12-14 NOTE — Telephone Encounter (Signed)
Order has already been placed in the sytem.

## 2011-12-15 ENCOUNTER — Ambulatory Visit (HOSPITAL_COMMUNITY)
Admission: RE | Admit: 2011-12-15 | Discharge: 2011-12-15 | Disposition: A | Discharge status: Home / Self Care | Payer: Medicare HMO | Source: Ambulatory Visit | Attending: Internal Medicine | Admitting: Internal Medicine

## 2011-12-15 DIAGNOSIS — Z1231 Encounter for screening mammogram for malignant neoplasm of breast: Secondary | ICD-10-CM | POA: Insufficient documentation

## 2012-03-29 ENCOUNTER — Other Ambulatory Visit: Payer: Self-pay | Admitting: Internal Medicine

## 2012-04-02 ENCOUNTER — Other Ambulatory Visit: Payer: Self-pay | Admitting: Internal Medicine

## 2012-04-13 ENCOUNTER — Other Ambulatory Visit: Payer: Medicare HMO

## 2012-04-13 ENCOUNTER — Ambulatory Visit: Payer: Medicare HMO | Admitting: Internal Medicine

## 2012-04-20 ENCOUNTER — Ambulatory Visit: Payer: Medicare HMO | Admitting: Internal Medicine

## 2012-04-24 ENCOUNTER — Other Ambulatory Visit (INDEPENDENT_AMBULATORY_CARE_PROVIDER_SITE_OTHER): Payer: Self-pay

## 2012-04-24 ENCOUNTER — Ambulatory Visit: Payer: Self-pay | Admitting: Family Medicine

## 2012-04-24 DIAGNOSIS — E559 Vitamin D deficiency, unspecified: Secondary | ICD-10-CM

## 2012-04-24 DIAGNOSIS — E119 Type 2 diabetes mellitus without complications: Secondary | ICD-10-CM

## 2012-04-24 DIAGNOSIS — Z23 Encounter for immunization: Secondary | ICD-10-CM

## 2012-04-24 LAB — HEMOGLOBIN A1C: Hgb A1c MFr Bld: 6.5 % (ref 4.6–6.5)

## 2012-04-26 ENCOUNTER — Encounter: Payer: Self-pay | Admitting: Internal Medicine

## 2012-04-26 ENCOUNTER — Ambulatory Visit (INDEPENDENT_AMBULATORY_CARE_PROVIDER_SITE_OTHER): Payer: Self-pay | Admitting: Internal Medicine

## 2012-04-26 VITALS — BP 112/70 | HR 73 | Temp 98.0°F | Wt 128.0 lb

## 2012-04-26 DIAGNOSIS — D485 Neoplasm of uncertain behavior of skin: Secondary | ICD-10-CM

## 2012-04-26 DIAGNOSIS — H00019 Hordeolum externum unspecified eye, unspecified eyelid: Secondary | ICD-10-CM

## 2012-04-26 DIAGNOSIS — R51 Headache: Secondary | ICD-10-CM

## 2012-04-26 DIAGNOSIS — E119 Type 2 diabetes mellitus without complications: Secondary | ICD-10-CM

## 2012-04-26 DIAGNOSIS — M1990 Unspecified osteoarthritis, unspecified site: Secondary | ICD-10-CM

## 2012-04-26 NOTE — Patient Instructions (Signed)
Labs are good   Can use warm compresses to the eyelid if recurrs  . Treat like a stye.  Will contact about derm referral.  Continue lifestyle intervention healthy eating and exercise . Monitor headaches   . Contact us if   persistent or progressive.  ROV in 6 months   Wellness with labs and hg a1c

## 2012-04-26 NOTE — Progress Notes (Signed)
Chief Complaint  Patient presents with  . Follow-up  . Diabetes    HPI: Patient comes in today for follow up of  multiple medical problems.  For med and condition evaluation   Stopped medication for about 3 weeks  For a while   andt hen started  Just was busy and didn't take this   Right sided ha  With neck  And ? Leg pain after sitting at dis a while  Remote hx of migraines and scan eval at [facility] in the past /   Check recurrent bump on left lower eye lid .  Burst and still has small bump.   Check mole right  Ms fold area  Getting bigger and  Itching at times .  ROS: See pertinent positives and negatives per HPI. Ms pain has arthritis  No change . No unusual infection or injury  Past Medical History  Diagnosis Date  . VITAMIN D DEFICIENCY 08/14/2009  . ABNORMAL TRANSAMINASE, (LFT'S) 11/14/2008     2005 US shows fatty liver  . FASTING HYPERGLYCEMIA 09/07/2006  . ABNORMAL EXAM-BILIARY TRACT 11/14/2008  . Nonspecific abnormal results of liver function study 09/07/2006  . OSTEOARTHRITIS 09/07/2006  . GERD 09/07/2006  . HYPERLIPIDEMIA 09/07/2006  . HYPOTHYROIDISM 10/30/2006  . Positive PPD   . Normal nuclear stress test 2005    stress cardiolite   . 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 Stroke Mother 59  . Arthritis Mother   . Rheum arthritis Brother   . Arthritis Brother   . Gout Brother     History   Social History  . Marital Status: Single    Spouse Name: N/A    Number of Children: N/A  . Years of Education: N/A   Occupational History  . PhD Professor    Social History Main Topics  . Smoking status: Never Smoker   . Smokeless tobacco: None  . Alcohol Use: 0.6 oz/week    1 Glasses of wine per week  . Drug Use: No  . Sexually Active:    Other Topics Concern  . None   Social History Narrative   HH of 2 Father and herNo pets PhD professor in information systems No tobacco or alcohol  exercising regularly currently    Outpatient Encounter Prescriptions as of 04/26/2012  Medication Sig Dispense Refill  . FreeStyle Unistick II Lancets MISC by Does not apply route.        Marland Kitchen glucose blood (FREESTYLE LITE) test strip 1 each by Other route as needed. Use as instructed       . metFORMIN (GLUCOPHAGE-XR) 500 MG 24 hr tablet TAKE 1 TABLET BY MOUTH ONCE A DAY WITH BREAKFAST  90 tablet  1  . simvastatin (ZOCOR) 40 MG tablet TAKE 1 TABLET BY MOUTH DAILY AT BEDTIME  90 tablet  0  . SYNTHROID 75 MCG tablet TAKE 1 TABLET BY MOUTH DAILY.  90 tablet  0  . Cholecalciferol 1000 UNITS capsule Take 1 capsule (1,000 Units total) by mouth daily.  90 capsule  3  . Omega-3 Fatty Acids (FISH OIL) 1000 MG CAPS Take by mouth.        . [DISCONTINUED] Diclofenac Sodium (PENNSAID) 1.5 % SOLN Place 0.4 mLs onto the skin 3 (three) times daily.  1 Bottle  12    EXAM:  BP 112/70  Pulse 73  Temp 98 F (  36.7 C) (Oral)  Wt 128 lb (58.06 kg)  SpO2 96%  There is no height on file to calculate BMI.  GENERAL: vitals reviewed and listed above, alert, oriented, appears well hydrated and in no acute distress  HEENT: atraumatic, conjunctiva  clear, no obvious abnormalities on inspection of external nose and ears  Small bump lower eye lid no swelling  OP : no lesion edema or exudate   NECK: no obvious masses on inspection palpation  No adenopathy  LUNGS: clear to auscultation bilaterally, no wheezes, rales or rhonchi, good air movement  CV: HRRR, no clubbing cyanosis or  peripheral edema nl cap refill  Abdomen:  Sof,t normal bowel sounds without hepatosplenomegaly, no guarding rebound or masses no CVA tenderness MS: moves all extremities without noticeable focal  abnormality Skin: normal capillary refill ,turgor , color: No acute rashes ,petechiae or bruising   right nl fold with 4 mm shiny mole with some central pigment NEURO: oriented x 3 CN 3-12 appear intact. No focal muscle weakness or atrophy. DTRs  symmetrical. Gait WNL.  Grossly non focal. No tremor or abnormal movement. PSYCH: pleasant and cooperative, no obvious depression or anxiety Lab Results  Component Value Date   WBC 6.5 10/14/2011   HGB 15.0 10/14/2011   HCT 44.5 10/14/2011   PLT 237.0 10/14/2011   GLUCOSE 107* 10/14/2011   CHOL 138 10/14/2011   TRIG 100.0 10/14/2011   HDL 52.20 10/14/2011   LDLDIRECT 163.6 10/23/2006   LDLCALC 66 10/14/2011   ALT 25 10/14/2011   AST 31 10/14/2011   NA 142 10/14/2011   K 4.6 10/14/2011   CL 104 10/14/2011   CREATININE 0.5 10/14/2011   BUN 14 10/14/2011   CO2 28 10/14/2011   TSH 2.51 10/14/2011   INR 1.0 ratio 11/14/2008   HGBA1C 6.5 04/24/2012   MICROALBUR 0.6 10/14/2011    ASSESSMENT AND PLAN:  Discussed the following assessment and plan:  1. Diabetes mellitus type 2, uncomplicated     good control   2. Neoplasm of uncertain behavior of skin  Ambulatory referral to Dermatology   right nl fold changes  will get derm consult  3. Stye     compresses  follow   4. Headache     seems triggered hx of migraine but ms postitional causes  inerventions disc and fu if needed  5. OSTEOARTHRITIS     has seen SM doing better exercises as tolerated     -Patient advised to return or notify health care team  immediately if symptoms worsen or persist or new concerns arise.  Patient Instructions  Labs are good   Can use warm compresses to the eyelid if recurrs  . Treat like a stye.  Will contact about derm referral.  Continue lifestyle intervention healthy eating and exercise . Monitor headaches   . Contact us if   persistent or progressive.  ROV in 6 months   Wellness with labs and hg a1c     Berniece Andreas M.D.

## 2012-04-28 LAB — VITAMIN D 1,25 DIHYDROXY: Vitamin D2 1, 25 (OH)2: 8 pg/mL

## 2012-05-01 ENCOUNTER — Inpatient Hospital Stay: Admission: RE | Admit: 2012-05-01 | Payer: Self-pay | Source: Ambulatory Visit

## 2012-06-08 ENCOUNTER — Other Ambulatory Visit: Payer: Self-pay | Admitting: Internal Medicine

## 2012-07-19 ENCOUNTER — Other Ambulatory Visit: Payer: Self-pay | Admitting: Internal Medicine

## 2012-07-23 ENCOUNTER — Other Ambulatory Visit: Payer: Self-pay | Admitting: Internal Medicine

## 2012-10-17 ENCOUNTER — Other Ambulatory Visit: Payer: Self-pay | Admitting: Internal Medicine

## 2012-10-20 ENCOUNTER — Other Ambulatory Visit: Payer: Self-pay | Admitting: Internal Medicine

## 2012-10-24 ENCOUNTER — Encounter: Payer: Self-pay | Admitting: Internal Medicine

## 2012-10-24 ENCOUNTER — Ambulatory Visit (INDEPENDENT_AMBULATORY_CARE_PROVIDER_SITE_OTHER): Payer: BC Managed Care – PPO | Admitting: Internal Medicine

## 2012-10-24 VITALS — BP 150/80 | HR 63 | Temp 98.1°F | Ht 58.5 in | Wt 129.0 lb

## 2012-10-24 DIAGNOSIS — E785 Hyperlipidemia, unspecified: Secondary | ICD-10-CM

## 2012-10-24 DIAGNOSIS — E2839 Other primary ovarian failure: Secondary | ICD-10-CM

## 2012-10-24 DIAGNOSIS — M79643 Pain in unspecified hand: Secondary | ICD-10-CM

## 2012-10-24 DIAGNOSIS — E119 Type 2 diabetes mellitus without complications: Secondary | ICD-10-CM

## 2012-10-24 DIAGNOSIS — M25549 Pain in joints of unspecified hand: Secondary | ICD-10-CM

## 2012-10-24 DIAGNOSIS — Z23 Encounter for immunization: Secondary | ICD-10-CM

## 2012-10-24 DIAGNOSIS — E039 Hypothyroidism, unspecified: Secondary | ICD-10-CM

## 2012-10-24 DIAGNOSIS — Z0000 Encounter for general adult medical examination without abnormal findings: Secondary | ICD-10-CM

## 2012-10-24 DIAGNOSIS — M6740 Ganglion, unspecified site: Secondary | ICD-10-CM

## 2012-10-24 LAB — MICROALBUMIN / CREATININE URINE RATIO: Creatinine,U: 99 mg/dL

## 2012-10-24 LAB — CBC WITH DIFFERENTIAL/PLATELET
Basophils Relative: 0.7 % (ref 0.0–3.0)
Eosinophils Absolute: 0.1 10*3/uL (ref 0.0–0.7)
Eosinophils Relative: 2.2 % (ref 0.0–5.0)
Lymphocytes Relative: 32.4 % (ref 12.0–46.0)
Neutrophils Relative %: 60.2 % (ref 43.0–77.0)
RBC: 4.84 Mil/uL (ref 3.87–5.11)
WBC: 6.5 10*3/uL (ref 4.5–10.5)

## 2012-10-24 LAB — LIPID PANEL
LDL Cholesterol: 71 mg/dL (ref 0–99)
Total CHOL/HDL Ratio: 3
Triglycerides: 107 mg/dL (ref 0.0–149.0)
VLDL: 21.4 mg/dL (ref 0.0–40.0)

## 2012-10-24 LAB — HEPATIC FUNCTION PANEL
ALT: 30 U/L (ref 0–35)
Bilirubin, Direct: 0.1 mg/dL (ref 0.0–0.3)
Total Bilirubin: 0.6 mg/dL (ref 0.3–1.2)

## 2012-10-24 LAB — BASIC METABOLIC PANEL
Calcium: 9.7 mg/dL (ref 8.4–10.5)
Creatinine, Ser: 0.7 mg/dL (ref 0.4–1.2)

## 2012-10-24 NOTE — Patient Instructions (Addendum)
Continue lifestyle intervention healthy eating and exercise . Will notify you  of labs when available. Schedule bone density . The nodules are probably ganglion cyst can wax and ane if  persistent or progressive causing problems contact us for other opinion.  Check on shingles zostavax  Vaccine and can get tdap and this vaccine when wish. Get diabetes eye check.  Check BP readings  To ensure they are below 140/90   Can send in readings by patient portal or other. If elevated will add medication If labs are acceptable  Plan hg a1c  In 6 months and then OV.

## 2012-10-24 NOTE — Progress Notes (Signed)
Chief Complaint  Patient presents with  . Annual Exam  . Diabetes    HPI: Patient comes in today for Preventive Health Care visit  Since her last visit she has had no major changes in her health. To his coming in just for lab work but is here for preventive visit lab work at that visit. No major injuries. Thyroid taking medicines regularly other in the last month has forgotten a was half the time but is taking it regularly the last few weeks. Lipid medication no side effects Metformin taking on a regular basis not checking her blood sugars as much recently Has gained a bit of weight over the summer. Is due for an eye check vision okay. Gets pain in her hands after gardening. If she takes Advil Aleve or Tylenol it helps asks about this. She gets nodules to pop up and down on her hands left greater than right. Has to now. Has never had a bone density.  ROS:  GEN/ HEENT: No fever, significant weight changes sweats headaches vision problems hearing changes, CV/ PULM; No chest pain shortness of breath cough, syncope,edema  change in exercise tolerance. GI /GU: No adominal pain, vomiting, change in bowel habits. No blood in the stool. No significant GU symptoms. SKIN/HEME: ,no acute skin rashes suspicious lesions or bleeding. No lymphadenopathy, nodules, masses.  NEURO/ PSYCH:  No neurologic signs such as weakness numbness. No depression anxiety. IMM/ Allergy: No unusual infections.  Allergy .   REST of 12 system review negative except as per HPI   Past Medical History  Diagnosis Date  . VITAMIN D DEFICIENCY 08/14/2009  . ABNORMAL TRANSAMINASE, (LFT'S) 11/14/2008     2005 US shows fatty liver  . FASTING HYPERGLYCEMIA 09/07/2006  . ABNORMAL EXAM-BILIARY TRACT 11/14/2008  . Nonspecific abnormal results of liver function study 09/07/2006  . OSTEOARTHRITIS 09/07/2006  . GERD 09/07/2006  . HYPERLIPIDEMIA 09/07/2006  . HYPOTHYROIDISM 10/30/2006  . Positive PPD   . Normal nuclear stress test 2005     stress cardiolite   . 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 Stroke Mother 65  . Arthritis Mother   . Rheum arthritis Brother   . Arthritis Brother   . Gout Brother     History   Social History  . Marital Status: Single    Spouse Name: N/A    Number of Children: N/A  . Years of Education: N/A   Occupational History  . PhD Professor    Social History Main Topics  . Smoking status: Never Smoker   . Smokeless tobacco: None  . Alcohol Use: 0.6 oz/week    1 Glasses of wine per week  . Drug Use: No  . Sexually Active:    Other Topics Concern  . None   Social History Narrative   HH of 2 Father and her   No pets    PhD professor in information systems    No tobacco or alcohol exercising regularly currently   Past Surgical History  Procedure Laterality Date  . Salivary gland surgery  1996    Tumor removed on back of tongue and salivary gland     Outpatient Encounter Prescriptions as of 10/24/2012  Medication Sig Dispense Refill  . Cholecalciferol 1000 UNITS capsule Take 1 capsule (1,000 Units total) by mouth daily.  90 capsule  3  . FreeStyle Unistick II Lancets MISC  by Does not apply route.        Marland Kitchen glucose blood (FREESTYLE LITE) test strip 1 each by Other route as needed. Use as instructed       . metFORMIN (GLUCOPHAGE-XR) 500 MG 24 hr tablet TAKE 1 TABLET BY MOUTH ONCE A DAY WITH BREAKFAST  90 tablet  0  . Omega-3 Fatty Acids (FISH OIL) 1000 MG CAPS Take by mouth.        . simvastatin (ZOCOR) 40 MG tablet TAKE 1 TABLET BY MOUTH DAILY AT BEDTIME  90 tablet  0  . SYNTHROID 75 MCG tablet TAKE 1 TABLET BY MOUTH DAILY.  90 tablet  0   No facility-administered encounter medications on file as of 10/24/2012.    EXAM:  BP 150/80  Pulse 63  Temp(Src) 98.1 F (36.7 C) (Oral)  Ht 4' 10.5" (1.486 m)  Wt 129 lb (58.514 kg)  BMI 26.5 kg/m2  SpO2 96%  Body mass index is 26.5  kg/(m^2).  Physical Exam: Vital signs reviewed [id number] is a well-developed well-nourished alert cooperative   female who appears her stated age in no acute distress.  HEENT: normocephalic atraumatic , Eyes: PERRL EOM's full, conjunctiva clear, Nares: paten,t no deformity discharge or tenderness., Ears: no deformity EAC's clear TMs with normal landmarks. Mouth: clear OP, no lesions, edema.  Moist mucous membranes. Dentition in adequate repair. NECK: supple without masses, thyromegaly or bruits. CHEST/PULM:  Clear to auscultation and percussion breath sounds equal no wheeze , rales or rhonchi. No chest wall deformities or tenderness. Breast: normal by inspection . No dimpling, discharge, masses, tenderness or discharge . CV: PMI is nondisplaced, S1 S2 no gallops, murmurs, rubs. Peripheral pulses are full without delay.No JVD .  ABDOMEN: Bowel sounds normal nontender  No guard or rebound, no hepato splenomegal no CVA tenderness.  No hernia. Extremtities:  No clubbing cyanosis or edema, no acute joint swelling or redness no focal atrophy mild puffiness on hands  Left wrist with cytic nodule ulnar area and radial area goo drom no atrophy. NEURO:  Oriented x3, cranial nerves 3-12 appear to be intact, no obvious focal weakness,gait within normal limits no abnormal reflexes or asymmetrical SKIN: No acute rashes normal turgor, color, no bruising or petechiae. PSYCH: Oriented, good eye contact, no obvious depression anxiety, cognition and judgment appear normal. LN: no cervical axillary inguinal adenopathy  Lab Results  Component Value Date   WBC 6.5 10/24/2012   HGB 15.0 10/24/2012   HCT 44.7 10/24/2012   PLT 238.0 10/24/2012   GLUCOSE 111* 10/24/2012   CHOL 144 10/24/2012   TRIG 107.0 10/24/2012   HDL 51.70 10/24/2012   LDLDIRECT 163.6 10/23/2006   LDLCALC 71 10/24/2012   ALT 30 10/24/2012   AST 27 10/24/2012   NA 140 10/24/2012   K 4.9 10/24/2012   CL 106 10/24/2012   CREATININE 0.7 10/24/2012   BUN 10  10/24/2012   CO2 29 10/24/2012   TSH 2.51 10/24/2012   INR 1.0 ratio 11/14/2008   HGBA1C 6.8* 10/24/2012   MICROALBUR 0.8 10/24/2012    ASSESSMENT AND PLAN:  Discussed the following assessment and plan:  Estrogen deficiency - Plan: DG Bone Density, Basic metabolic panel, CBC with Differential, Hemoglobin A1c, Hepatic function panel, Lipid panel, TSH, Microalbumin / creatinine urine ratio  HYPOTHYROIDISM - Plan: DG Bone Density, Basic metabolic panel, CBC with Differential, Hemoglobin A1c, Hepatic function panel, Lipid panel, TSH, Microalbumin / creatinine urine ratio  HYPERLIPIDEMIA - Plan: DG Bone Density, Basic metabolic panel, CBC  with Differential, Hemoglobin A1c, Hepatic function panel, Lipid panel, TSH, Microalbumin / creatinine urine ratio  Pain in joint, hand, unspecified laterality - Plan: DG Bone Density, Basic metabolic panel, CBC with Differential, Hemoglobin A1c, Hepatic function panel, Lipid panel, TSH, Microalbumin / creatinine urine ratio  Diabetes mellitus type 2, uncomplicated - Plan: DG Bone Density, Basic metabolic panel, CBC with Differential, Hemoglobin A1c, Hepatic function panel, Lipid panel, TSH, Microalbumin / creatinine urine ratio  Visit for preventive health examination - Plan: DG Bone Density, Basic metabolic panel, CBC with Differential, Hemoglobin A1c, Hepatic function panel, Lipid panel, TSH, Microalbumin / creatinine urine ratio  Need for prophylactic vaccination against Streptococcus pneumoniae (pneumococcus) - Plan: Pneumococcal polysaccharide vaccine 23-valent greater than or equal to 2yo subcutaneous/IM  Ganglion - prob left wrist  Counseled regarding healthy nutrition, exercise, sleep, injury prevention, calcium vit d and healthy weight . Disc immuniz pneumo today shingelsand tdap alter  Patient Care Team: Madelin Headings, MD as PCP - General Patient Instructions  Continue lifestyle intervention healthy eating and exercise . Will notify you  of labs  when available. Schedule bone density .  The nodules are probably ganglion cyst can wax and ane if  persistent or progressive causing problems contact us for other opinion.   Check on shingles zostavax  Vaccine and can get tdap and this vaccine when wish.  Get diabetes eye check.  Check BP readings  To ensure they are below 140/90   Can send in readings by patient portal or other.  If labs are acceptable  Plan hg a1c  In 6 months and then OV.     Neta Mends. Cachet Mccutchen M.D. Health Maintenance  Topic Date Due  . Zostavax  06/13/2007  . Pneumococcal Polysaccharide Vaccine Age 65 And Over  06/12/2012  . Influenza Vaccine  12/10/2012  . Colonoscopy  08/05/2013  . Mammogram  12/14/2013  . Tetanus/tdap  05/31/2020   Health Maintenance Review    [id number]

## 2012-11-01 ENCOUNTER — Ambulatory Visit (INDEPENDENT_AMBULATORY_CARE_PROVIDER_SITE_OTHER)
Admission: RE | Admit: 2012-11-01 | Discharge: 2012-11-01 | Disposition: A | Discharge status: Home / Self Care | Payer: BC Managed Care – PPO | Source: Ambulatory Visit | Attending: Internal Medicine | Admitting: Internal Medicine

## 2012-11-01 DIAGNOSIS — M6740 Ganglion, unspecified site: Secondary | ICD-10-CM

## 2012-11-01 DIAGNOSIS — E785 Hyperlipidemia, unspecified: Secondary | ICD-10-CM

## 2012-11-01 DIAGNOSIS — Z23 Encounter for immunization: Secondary | ICD-10-CM

## 2012-11-01 DIAGNOSIS — E039 Hypothyroidism, unspecified: Secondary | ICD-10-CM

## 2012-11-01 DIAGNOSIS — M25549 Pain in joints of unspecified hand: Secondary | ICD-10-CM

## 2012-11-01 DIAGNOSIS — E119 Type 2 diabetes mellitus without complications: Secondary | ICD-10-CM

## 2012-11-01 DIAGNOSIS — M79643 Pain in unspecified hand: Secondary | ICD-10-CM

## 2012-11-01 DIAGNOSIS — E2839 Other primary ovarian failure: Secondary | ICD-10-CM

## 2012-11-01 DIAGNOSIS — Z0000 Encounter for general adult medical examination without abnormal findings: Secondary | ICD-10-CM

## 2012-11-14 ENCOUNTER — Encounter: Payer: Self-pay | Admitting: Internal Medicine

## 2012-11-15 ENCOUNTER — Encounter: Payer: Self-pay | Admitting: Internal Medicine

## 2012-11-15 DIAGNOSIS — M8580 Other specified disorders of bone density and structure, unspecified site: Secondary | ICD-10-CM | POA: Insufficient documentation

## 2012-12-08 ENCOUNTER — Other Ambulatory Visit: Payer: Self-pay | Admitting: Internal Medicine

## 2012-12-17 ENCOUNTER — Other Ambulatory Visit: Payer: Self-pay | Admitting: Internal Medicine

## 2013-01-09 ENCOUNTER — Telehealth: Payer: Self-pay | Admitting: Internal Medicine

## 2013-01-09 ENCOUNTER — Encounter: Payer: Self-pay | Admitting: Family Medicine

## 2013-01-09 NOTE — Telephone Encounter (Signed)
Pt states she is returning a call that she received. She states that she does not know who called her, or the nature of the call.

## 2013-01-10 NOTE — Telephone Encounter (Signed)
Left message on personally identified number below informing her of all.

## 2013-02-02 ENCOUNTER — Other Ambulatory Visit: Payer: Self-pay | Admitting: Internal Medicine

## 2013-02-14 ENCOUNTER — Other Ambulatory Visit: Payer: Self-pay

## 2013-04-18 ENCOUNTER — Other Ambulatory Visit (INDEPENDENT_AMBULATORY_CARE_PROVIDER_SITE_OTHER): Payer: BC Managed Care – PPO

## 2013-04-18 ENCOUNTER — Ambulatory Visit (INDEPENDENT_AMBULATORY_CARE_PROVIDER_SITE_OTHER): Payer: BC Managed Care – PPO | Admitting: Family Medicine

## 2013-04-18 DIAGNOSIS — R7989 Other specified abnormal findings of blood chemistry: Secondary | ICD-10-CM

## 2013-04-18 DIAGNOSIS — Z23 Encounter for immunization: Secondary | ICD-10-CM

## 2013-04-18 LAB — HEMOGLOBIN A1C: HEMOGLOBIN A1C: 6.7 % — AB (ref 4.6–6.5)

## 2013-04-24 ENCOUNTER — Telehealth: Payer: Self-pay | Admitting: Internal Medicine

## 2013-04-24 DIAGNOSIS — Z0000 Encounter for general adult medical examination without abnormal findings: Secondary | ICD-10-CM

## 2013-04-24 DIAGNOSIS — E119 Type 2 diabetes mellitus without complications: Secondary | ICD-10-CM

## 2013-04-24 NOTE — Telephone Encounter (Signed)
Pt called to r/s ov scheduled for tomorrow(04/25/13) at 8:30 due to a meeting she has, appt was r/s to 05/28/13 @ 8:30 since she needs an early morning appt on a Tuesday.  However, pt states this visit is for a 6 month follow up to discuss labs, pt inquiring if this appt is even necessary.

## 2013-04-25 ENCOUNTER — Ambulatory Visit: Payer: BC Managed Care – PPO | Admitting: Internal Medicine

## 2013-04-25 NOTE — Telephone Encounter (Signed)
Since her a1c is stable and if she is doing well Ok to skip the ROV  BUT  Schedule CPX PV and full set of cpx labs and hg a1c in July when she is due for yearly.

## 2013-04-29 ENCOUNTER — Other Ambulatory Visit: Payer: Self-pay | Admitting: Family Medicine

## 2013-04-29 NOTE — Telephone Encounter (Signed)
I have placed the order for lab work. Please make necessary appointments.  Thanks!

## 2013-04-29 NOTE — Telephone Encounter (Signed)
Pt request to keep 2-17 appt and decline to sch cpx now for July. Pt said she does not need any lab work

## 2013-05-28 ENCOUNTER — Ambulatory Visit: Payer: BC Managed Care – PPO | Admitting: Internal Medicine

## 2013-06-12 ENCOUNTER — Encounter: Payer: Self-pay | Admitting: Internal Medicine

## 2013-06-12 ENCOUNTER — Ambulatory Visit (INDEPENDENT_AMBULATORY_CARE_PROVIDER_SITE_OTHER): Payer: BC Managed Care – PPO | Admitting: Internal Medicine

## 2013-06-12 VITALS — BP 120/72 | HR 75 | Wt 130.0 lb

## 2013-06-12 DIAGNOSIS — Z2911 Encounter for prophylactic immunotherapy for respiratory syncytial virus (RSV): Secondary | ICD-10-CM

## 2013-06-12 DIAGNOSIS — E119 Type 2 diabetes mellitus without complications: Secondary | ICD-10-CM

## 2013-06-12 DIAGNOSIS — Z23 Encounter for immunization: Secondary | ICD-10-CM

## 2013-06-12 MED ORDER — GLUCOSE BLOOD VI STRP: ORAL_STRIP | Status: DC

## 2013-06-12 MED ORDER — ACCU-CHEK SOFTCLIX LANCET DEV MISC: Status: DC

## 2013-06-12 NOTE — Progress Notes (Signed)
Chief Complaint  Patient presents with  . Follow-up  . Diabetes    HPI: Patient comes in today for follow up of  multiple medical problems.  DM no  vision neuro sx meds may have ha d a low sugar machine not working needs new one walking and exercising more until weather got bad. Thryoid same BPstable   ROS: See pertinent positives and negatives per HPI.no cv pulm sx legs feel heavy hard to describe at night no numbness or sever pain no swelling she sates hard to  Describe   Past Medical History  Diagnosis Date  . VITAMIN D DEFICIENCY 08/14/2009  . ABNORMAL TRANSAMINASE, (LFT'S) 11/14/2008     2005 US shows fatty liver  . FASTING HYPERGLYCEMIA 09/07/2006  . ABNORMAL EXAM-BILIARY TRACT 11/14/2008  . Nonspecific abnormal results of liver function study 09/07/2006  . OSTEOARTHRITIS 09/07/2006  . GERD 09/07/2006  . HYPERLIPIDEMIA 09/07/2006  . HYPOTHYROIDISM 10/30/2006  . Positive PPD   . Normal nuclear stress test 2005    stress cardiolite   . 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 Stroke Mother 36  . Arthritis Mother   . Rheum arthritis Brother   . Arthritis Brother   . Gout Brother     History   Social History  . Marital Status: Single    Spouse Name: N/A    Number of Children: N/A  . Years of Education: N/A   Occupational History  . PhD Professor    Social History Main Topics  . Smoking status: Never Smoker   . Smokeless tobacco: None  . Alcohol Use: 0.6 oz/week    1 Glasses of wine per week  . Drug Use: No  . Sexual Activity:    Other Topics Concern  . None   Social History Narrative   HH of 2 Father and her   No pets    PhD professor in information systems    No tobacco or alcohol exercising regularly currently    Outpatient Encounter Prescriptions as of 06/12/2013  Medication Sig  . Cholecalciferol 1000 UNITS capsule Take 1 capsule (1,000 Units total) by mouth daily.  .  FreeStyle Unistick II Lancets MISC by Does not apply route.    Marland Kitchen glucose blood (FREESTYLE LITE) test strip 1 each by Other route as needed. Use as instructed   . metFORMIN (GLUCOPHAGE-XR) 500 MG 24 hr tablet TAKE 1 TABLET BY MOUTH ONCE A DAY WITH BREAKFAST  . Omega-3 Fatty Acids (FISH OIL) 1000 MG CAPS Take by mouth.    . simvastatin (ZOCOR) 40 MG tablet TAKE 1 TABLET BY MOUTH DAILY AT BEDTIME  . SYNTHROID 75 MCG tablet TAKE 1 TABLET BY MOUTH DAILY.  Marland Kitchen glucose blood (ACCU-CHEK AVIVA PLUS) test strip Test twice daily.  Elmore Guise Devices (ACCU-CHEK SOFTCLIX) lancets Test twice daily.    EXAM:  BP 120/72  Pulse 75  Wt 130 lb (58.968 kg)  Body mass index is 26.7 kg/(m^2). BP Readings from Last 3 Encounters:  06/12/13 120/72  10/24/12 150/80  04/26/12 112/70    GENERAL: vitals reviewed and listed above, alert, oriented, appears well hydrated and in no acute distress HEENT: atraumatic, conjunctiva  clear, no obvious abnormalities on inspection of external nose and ears  NECK: no obvious masses on inspection palpation MS: moves all extremities without noticeable focal  Abnormality diabetic foot exam mild scaling bottom  of help no lesions  Legs clear few VV right no edema or cords  PSYCH: pleasant and cooperative, no obvious depression or anxiety Lab Results  Component Value Date   WBC 6.5 10/24/2012   HGB 15.0 10/24/2012   HCT 44.7 10/24/2012   PLT 238.0 10/24/2012   GLUCOSE 111* 10/24/2012   CHOL 144 10/24/2012   TRIG 107.0 10/24/2012   HDL 51.70 10/24/2012   LDLDIRECT 163.6 10/23/2006   LDLCALC 71 10/24/2012   ALT 30 10/24/2012   AST 27 10/24/2012   NA 140 10/24/2012   K 4.9 10/24/2012   CL 106 10/24/2012   CREATININE 0.7 10/24/2012   BUN 10 10/24/2012   CO2 29 10/24/2012   TSH 2.51 10/24/2012   INR 1.0 ratio 11/14/2008   HGBA1C 6.7* 04/18/2013   MICROALBUR 0.8 10/24/2012    ASSESSMENT AND PLAN:  Discussed the following assessment and plan:  Diabetes mellitus type 2, uncomplicated - good  control  - Plan: glucose blood (ACCU-CHEK AVIVA PLUS) test strip, Lancet Devices (ACCU-CHEK SOFTCLIX) lancets  Need for prophylactic vaccination and inoculation against other viral diseases(V04.89) - Plan: Varicella-zoster vaccine subcutaneous  Need for prophylactic vaccination with combined diphtheria-tetanus-pertussis (DTP) vaccine - Plan: Tdap vaccine greater than or equal to 7yo IM prevnar  In future  Wellness labs  At yearly in July  Health Maintenance  Topic Date Due  . Zostavax  06/13/2007  . Colonoscopy  08/05/2013  . Influenza Vaccine  11/09/2013  . Mammogram  12/14/2013  . Tetanus/tdap  05/31/2020  . Pneumococcal Polysaccharide Vaccine Age 88 And Over  Completed   Health Maintenance Review   -Patient advised to return or notify health care team  if symptoms worsen or persist or new concerns arise.  Patient Instructions  Continue lifestyle intervention healthy eating and exercise . Feet look good.  Blood pressure is good. tdap and zostavax today  Wellness and full set of labs with hg a1c in July  prevnar 13 at that time.  New machine is accucheck Can contact insurance about which machine and strips are covered .    Standley Brooking. Panosh M.D.  accu check aviva plus

## 2013-06-12 NOTE — Patient Instructions (Signed)
Continue lifestyle intervention healthy eating and exercise . Feet look good.  Blood pressure is good. tdap and zostavax today  Wellness and full set of labs with hg a1c in July  prevnar 13 at that time.  New machine is accucheck Can contact insurance about which machine and strips are covered .

## 2013-06-12 NOTE — Progress Notes (Signed)
Pre visit review using our clinic review tool, if applicable. No additional management support is needed unless otherwise documented below in the visit note. 

## 2013-06-14 ENCOUNTER — Telehealth: Payer: Self-pay

## 2013-06-14 NOTE — Telephone Encounter (Signed)
Relevant patient education assigned to patient using Emmi. ° °

## 2013-07-24 ENCOUNTER — Other Ambulatory Visit: Payer: Self-pay | Admitting: Internal Medicine

## 2013-09-13 ENCOUNTER — Encounter: Payer: Self-pay | Admitting: Internal Medicine

## 2013-10-22 ENCOUNTER — Other Ambulatory Visit (INDEPENDENT_AMBULATORY_CARE_PROVIDER_SITE_OTHER): Payer: BC Managed Care – PPO

## 2013-10-22 DIAGNOSIS — E119 Type 2 diabetes mellitus without complications: Secondary | ICD-10-CM

## 2013-10-22 DIAGNOSIS — Z0000 Encounter for general adult medical examination without abnormal findings: Secondary | ICD-10-CM

## 2013-10-22 LAB — LIPID PANEL
CHOL/HDL RATIO: 3
CHOLESTEROL: 139 mg/dL (ref 0–200)
HDL: 53.3 mg/dL (ref >39.00)
LDL Cholesterol: 57 mg/dL (ref 0–99)
NonHDL: 85.7
Triglycerides: 144 mg/dL (ref 0.0–149.0)
VLDL: 28.8 mg/dL (ref 0.0–40.0)

## 2013-10-22 LAB — BASIC METABOLIC PANEL
BUN: 14 mg/dL (ref 6–23)
CO2: 29 mEq/L (ref 19–32)
CREATININE: 0.6 mg/dL (ref 0.4–1.2)
Calcium: 9.7 mg/dL (ref 8.4–10.5)
Chloride: 106 mEq/L (ref 96–112)
GFR: 104.18 mL/min (ref >60.00)
Glucose, Bld: 119 mg/dL — ABNORMAL HIGH (ref 70–99)
Potassium: 4.7 mEq/L (ref 3.5–5.1)
Sodium: 142 mEq/L (ref 135–145)

## 2013-10-22 LAB — CBC WITH DIFFERENTIAL/PLATELET
BASOS PCT: 0.5 % (ref 0.0–3.0)
Basophils Absolute: 0 10*3/uL (ref 0.0–0.1)
EOS ABS: 0.3 10*3/uL (ref 0.0–0.7)
EOS PCT: 4.3 % (ref 0.0–5.0)
HEMATOCRIT: 44.6 % (ref 36.0–46.0)
HEMOGLOBIN: 14.7 g/dL (ref 12.0–15.0)
Lymphocytes Relative: 38.4 % (ref 12.0–46.0)
Lymphs Abs: 2.3 10*3/uL (ref 0.7–4.0)
MCHC: 33 g/dL (ref 30.0–36.0)
MCV: 91.4 fl (ref 78.0–100.0)
MONO ABS: 0.4 10*3/uL (ref 0.1–1.0)
Monocytes Relative: 5.9 % (ref 3.0–12.0)
NEUTROS ABS: 3.1 10*3/uL (ref 1.4–7.7)
NEUTROS PCT: 50.9 % (ref 43.0–77.0)
Platelets: 248 10*3/uL (ref 150.0–400.0)
RBC: 4.88 Mil/uL (ref 3.87–5.11)
RDW: 12.9 % (ref 11.5–15.5)
WBC: 6.1 10*3/uL (ref 4.0–10.5)

## 2013-10-22 LAB — HEPATIC FUNCTION PANEL
ALT: 24 U/L (ref 0–35)
AST: 23 U/L (ref 0–37)
Albumin: 4.4 g/dL (ref 3.5–5.2)
Alkaline Phosphatase: 55 U/L (ref 39–117)
Bilirubin, Direct: 0.1 mg/dL (ref 0.0–0.3)
Total Bilirubin: 0.6 mg/dL (ref 0.2–1.2)
Total Protein: 7.3 g/dL (ref 6.0–8.3)

## 2013-10-22 LAB — HEMOGLOBIN A1C: HEMOGLOBIN A1C: 6.9 % — AB (ref 4.6–6.5)

## 2013-10-22 LAB — TSH: TSH: 0.97 u[IU]/mL (ref 0.35–4.50)

## 2013-10-23 ENCOUNTER — Ambulatory Visit (AMBULATORY_SURGERY_CENTER): Payer: Self-pay | Admitting: *Deleted

## 2013-10-23 VITALS — Ht 60.0 in | Wt 127.8 lb

## 2013-10-23 DIAGNOSIS — Z1211 Encounter for screening for malignant neoplasm of colon: Secondary | ICD-10-CM

## 2013-10-23 MED ORDER — MOVIPREP 100 G PO SOLR: ORAL | Status: DC

## 2013-10-23 NOTE — Progress Notes (Signed)
Patient denies any allergies to eggs or soy. Patient denies any problems with anesthesia/sedation. Patient denies any oxygen use at home and does not take any diet/weight loss medications. EMMI education assisgned to patient on colonoscopy, this was explained and instructions given to patient. Patient states that she does not remember having any problems drinking the prep solution during last colon 2005, explained the Moviprep, patient states she will be fine drinking that. She feels she will not have any problems with prep, explained the 24 hour # to patient and to call us if any problems occur. She understands.

## 2013-10-24 ENCOUNTER — Encounter: Payer: Self-pay | Admitting: Internal Medicine

## 2013-10-28 ENCOUNTER — Ambulatory Visit (INDEPENDENT_AMBULATORY_CARE_PROVIDER_SITE_OTHER): Payer: BC Managed Care – PPO | Admitting: Internal Medicine

## 2013-10-28 ENCOUNTER — Encounter: Payer: Self-pay | Admitting: Internal Medicine

## 2013-10-28 VITALS — BP 122/74 | HR 78 | Temp 98.2°F | Ht 58.5 in | Wt 130.0 lb

## 2013-10-28 DIAGNOSIS — E119 Type 2 diabetes mellitus without complications: Secondary | ICD-10-CM

## 2013-10-28 DIAGNOSIS — Z0000 Encounter for general adult medical examination without abnormal findings: Secondary | ICD-10-CM | POA: Insufficient documentation

## 2013-10-28 DIAGNOSIS — E039 Hypothyroidism, unspecified: Secondary | ICD-10-CM

## 2013-10-28 DIAGNOSIS — R351 Nocturia: Secondary | ICD-10-CM

## 2013-10-28 DIAGNOSIS — E785 Hyperlipidemia, unspecified: Secondary | ICD-10-CM

## 2013-10-28 LAB — POCT URINALYSIS DIP (MANUAL ENTRY)
Bilirubin, UA: NEGATIVE
Blood, UA: NEGATIVE
Glucose, UA: 250
Leukocytes, UA: NEGATIVE
Nitrite, UA: NEGATIVE
PH UA: 5.5
PROTEIN UA: NEGATIVE
SPEC GRAV UA: 1.025
UROBILINOGEN UA: 0.2

## 2013-10-28 MED ORDER — METFORMIN HCL ER 500 MG PO TB24: 1000.0000 mg | ORAL_TABLET | Freq: Every day | ORAL | Status: DC

## 2013-10-28 NOTE — Progress Notes (Signed)
Pre visit review using our clinic review tool, if applicable. No additional management support is needed unless otherwise documented below in the visit note. 

## 2013-10-28 NOTE — Patient Instructions (Signed)
Intensify lifestyle intervention to help fasting blood sugar. Get back into some type of exercise. Increase metformin to 1000 mg a day or 2 500s per day.  eye check. If the leg symptoms are continuing and problematic despite good blood sugar control and exercise we could consider other intervention. At this point many of the medicines for restless leg have more potential side effects than help. Urine tests today to check for protein and infection.  Check hemoglobin A1c 3 visit in 4 months. Or as needed

## 2013-10-28 NOTE — Progress Notes (Signed)
Chief Complaint  Patient presents with  . Annual Exam    no pap    HPI: Patient comes in today for Preventive Health Care visit  No major changes or injuries DM : Since last visit  Monitored for a month  Fasting tender to be up. Despite dietary changes. Hasn't been exercising this summer. Has a house guest. No lows taking metformin 500 mg once a day Due for eye check. We'll get this Thyroid no change in medication LIPIDS no perceived side effects. Warm legs? Rls.  And comes and goes   Health Maintenance  Topic Date Due  . Foot Exam  06/12/1957  . Ophthalmology Exam  06/12/1957  . Colonoscopy  08/05/2013  . Urine Microalbumin  10/24/2013  . Influenza Vaccine  11/09/2013  . Mammogram  12/14/2013  . Hemoglobin A1c  04/24/2014  . Tetanus/tdap  06/13/2023  . Pneumococcal Polysaccharide Vaccine Age 64 And Over  Completed  . Zostavax  Completed   Health Maintenance Review LIFESTYLE:  Exercise:  3-4 x per week and walking and mil pump   Tobacco/ETS:no  Alcohol: no Sugar beverages: no Sleep: some problem  irreg  Recently.  Trying to get back to normal  Drug use: no Bone density:  2014 osteopenia -1.7 hip femoral neck Colonoscopy: sceduledd 7 57 perry We'll be getting mammogram soon  ROS:  GEN/ HEENT: No fever, significant weight changes sweats headaches vision problems hearing changes, CV/ PULM; No chest pain shortness of breath cough, syncope,edema  change in exercise tolerance. GI /GU: No adominal pain, vomiting, change in bowel habits. No blood in the stool. No significant GU symptoms. SKIN/HEME: ,no acute skin rashes suspicious lesions or bleeding. No lymphadenopathy, nodules, masses.  NEURO/ PSYCH:  No neurologic signs such as weakness numbness. No depression anxiety. IMM/ Allergy: No unusual infections.  Allergy .   REST of 12 system review negative except as per HPI   Past Medical History  Diagnosis Date  . VITAMIN D DEFICIENCY 08/14/2009  . ABNORMAL TRANSAMINASE,  (LFT'S) 11/14/2008     2005 US shows fatty liver  . FASTING HYPERGLYCEMIA 09/07/2006  . ABNORMAL EXAM-BILIARY TRACT 11/14/2008  . Nonspecific abnormal results of liver function study 09/07/2006  . OSTEOARTHRITIS 09/07/2006  . GERD 09/07/2006  . HYPERLIPIDEMIA 09/07/2006  . HYPOTHYROIDISM 10/30/2006  . Positive PPD   . Normal nuclear stress test 2005    stress cardiolite   . 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 Diabetes mellitus without complication   . Cancer 1996    tongue cancer   Past Surgical History  Procedure Laterality Date  . Salivary gland surgery  1996    Tumor removed on back of tongue and salivary gland    Family History  Problem Relation Age of Onset  . Stroke Mother 65  . Arthritis Mother   . Rheum arthritis Brother   . Arthritis Brother   . Gout Brother   . Colon cancer Other     History   Social History  . Marital Status: Single    Spouse Name: N/A    Number of Children: N/A  . Years of Education: N/A   Occupational History  . PhD Professor    Social History Main Topics  . Smoking status: Never Smoker   . Smokeless tobacco: Never Used  . Alcohol Use: 0.6 oz/week    1 Glasses of wine per week  . Drug Use: No  . Sexual  Activity: None   Other Topics Concern  . None   Social History Narrative   HH of 2 Father and her   No pets    PhD professor in information systems    No tobacco or alcohol exercising regularly currently    Outpatient Encounter Prescriptions as of 10/28/2013  Medication Sig  . Cholecalciferol 1000 UNITS capsule Take 1 capsule (1,000 Units total) by mouth daily.  Marland Kitchen glucose blood (ACCU-CHEK AVIVA PLUS) test strip Test twice daily.  Elmore [employer] Devices (ACCU-CHEK SOFTCLIX) lancets Test twice daily.  . metFORMIN (GLUCOPHAGE-XR) 500 MG 24 hr tablet Take 2 tablets (1,000 mg total) by mouth daily.  . Omega-3 Fatty Acids (FISH OIL) 1000 MG CAPS Take by mouth.    . simvastatin (ZOCOR) 40 MG tablet TAKE 1  TABLET BY MOUTH DAILY AT BEDTIME  . SYNTHROID 75 MCG tablet TAKE 1 TABLET BY MOUTH DAILY.  . [DISCONTINUED] metFORMIN (GLUCOPHAGE-XR) 500 MG 24 hr tablet TAKE 1 TABLET BY MOUTH ONCE A DAY WITH BREAKFAST  . MOVIPREP 100 G SOLR MoviPrep (no substitutions)-TAKE AS DIRECTED.  . [DISCONTINUED] FreeStyle Unistick II Lancets MISC by Does not apply route.    . [DISCONTINUED] glucose blood (FREESTYLE LITE) test strip 1 each by Other route as needed. Use as instructed     EXAM:  BP 122/74  Pulse 78  Temp(Src) 98.2 F (36.8 C) (Oral)  Ht 4' 10.5" (1.486 m)  Wt 130 lb (58.968 kg)  BMI 26.70 kg/m2  Body mass index is 26.7 kg/(m^2).  Physical Exam: Vital signs reviewed VEL:FYBO is a well-developed well-nourished alert cooperative  Female   who appearsr stated age in no acute distress.  HEENT: normocephalic atraumatic , Eyes: PERRL EOM's full, conjunctiva clear, Nares: paten,t no deformity discharge or tenderness., Ears: no deformity EAC's clear TMs with normal landmarks. Mouth: clear OP, no lesions, edema.  Moist mucous membranes. Dentition in adequate repair. NECK: supple without masses, thyromegaly or bruits. CHEST/PULM:  Clear to auscultation and percussion breath sounds equal no wheeze , rales or rhonchi. No chest wall deformities or tenderness. Breast: normal by inspection . No dimpling, discharge, masses, tenderness or discharge . CV: PMI is nondisplaced, S1 S2 no gallops, murmurs, rubs. Peripheral pulses are full without delay.No JVD .  ABDOMEN: Bowel sounds normal nontender  No guard or rebound, no hepato splenomegal no CVA tenderness.  No hernia. Extremtities:  No clubbing cyanosis or edema, no acute joint swelling or redness no focal atrophy NEURO:  Oriented x3, cranial nerves 3-12 appear to be intact, no obvious focal weakness,gait within normal limits no abnormal reflexes or asymmetrical diabetic foot exam normal  Intact   Pulses and sensation to 10 g monofilament SKIN: No acute rashes  normal turgor, color, no bruising or petechiae. PSYCH: Oriented, good eye contact, no obvious depression anxiety, cognition and judgment appear normal. LN: no cervical axillary inguinal adenopathy  Lab Results  Component Value Date   WBC 6.1 10/22/2013   HGB 14.7 10/22/2013   HCT 44.6 10/22/2013   PLT 248.0 10/22/2013   GLUCOSE 119* 10/22/2013   CHOL 139 10/22/2013   TRIG 144.0 10/22/2013   HDL 53.30 10/22/2013   LDLDIRECT 163.6 10/23/2006   LDLCALC 57 10/22/2013   ALT 24 10/22/2013   AST 23 10/22/2013   NA 142 10/22/2013   K 4.7 10/22/2013   CL 106 10/22/2013   CREATININE 0.6 10/22/2013   BUN 14 10/22/2013   CO2 29 10/22/2013   TSH 0.97 10/22/2013   INR 1.0 ratio  11/14/2008   HGBA1C 6.9* 10/22/2013   MICROALBUR 0.8 10/24/2012    ASSESSMENT AND PLAN:  Discussed the following assessment and plan:  Diabetes mellitus type 2, uncomplicated - Plan: Microalbumin / creatinine urine ratio, POCT urinalysis dipstick  Visit for preventive health examination - Plan: Microalbumin / creatinine urine ratio, POCT urinalysis dipstick  Osteoarthrosis, unspecified whether generalized or localized, unspecified site  HYPERLIPIDEMIA  Nocturia - Plan: Microalbumin / creatinine urine ratio, POCT urinalysis dipstick  Patient Care Team: Burnis Medin, MD as PCP - General Patient Instructions  Intensify lifestyle intervention to help fasting blood sugar. Get back into some type of exercise. Increase metformin to 1000 mg a day or 2 500s per day.  eye check. If the leg symptoms are continuing and problematic despite good blood sugar control and exercise we could consider other intervention. At this point many of the medicines for restless leg have more potential side effects than help. Urine tests today to check for protein and infection.  Check hemoglobin A1c 3 visit in 4 months. Or as needed   Standley Brooking. Keya Wynes M.D.

## 2013-10-29 ENCOUNTER — Other Ambulatory Visit: Payer: BC Managed Care – PPO

## 2013-10-29 LAB — MICROALBUMIN / CREATININE URINE RATIO
Creatinine,U: 163.5 mg/dL
MICROALB UR: 1.4 mg/dL (ref 0.0–1.9)
Microalb Creat Ratio: 0.9 mg/g (ref 0.0–30.0)

## 2013-11-04 ENCOUNTER — Telehealth: Payer: Self-pay | Admitting: Internal Medicine

## 2013-11-04 NOTE — Telephone Encounter (Signed)
No charge. 

## 2013-11-05 ENCOUNTER — Telehealth: Payer: Self-pay | Admitting: Internal Medicine

## 2013-11-05 NOTE — Telephone Encounter (Signed)
Pt would like to know if she should come in a week prior for her a1c? Instructions are not very clear to her and she would like to clarify. Usually pt does come in a week earlier pls

## 2013-11-05 NOTE — Telephone Encounter (Signed)
Done. Pt aware 

## 2013-11-05 NOTE — Telephone Encounter (Signed)
Patient should have her A1C before her follow up visit in Nov.  Please make sure the pt is scheduled for both appointments.

## 2013-11-06 ENCOUNTER — Encounter: Payer: BC Managed Care – PPO | Admitting: Internal Medicine

## 2013-12-06 ENCOUNTER — Other Ambulatory Visit: Payer: Self-pay | Admitting: Internal Medicine

## 2013-12-06 NOTE — Telephone Encounter (Signed)
Sent to the pharmacy by e-scribe. 

## 2013-12-23 ENCOUNTER — Other Ambulatory Visit: Payer: Self-pay | Admitting: Internal Medicine

## 2013-12-23 NOTE — Telephone Encounter (Signed)
Sent to the pharmacy by e-scribe. 

## 2014-02-20 ENCOUNTER — Other Ambulatory Visit (INDEPENDENT_AMBULATORY_CARE_PROVIDER_SITE_OTHER): Payer: BC Managed Care – PPO

## 2014-02-20 DIAGNOSIS — E119 Type 2 diabetes mellitus without complications: Secondary | ICD-10-CM

## 2014-02-20 LAB — HEMOGLOBIN A1C: HEMOGLOBIN A1C: 6.4 % (ref 4.6–6.5)

## 2014-02-27 ENCOUNTER — Ambulatory Visit (INDEPENDENT_AMBULATORY_CARE_PROVIDER_SITE_OTHER): Payer: BC Managed Care – PPO | Admitting: Family Medicine

## 2014-02-27 ENCOUNTER — Encounter: Payer: Self-pay | Admitting: Internal Medicine

## 2014-02-27 ENCOUNTER — Ambulatory Visit (INDEPENDENT_AMBULATORY_CARE_PROVIDER_SITE_OTHER): Payer: BC Managed Care – PPO | Admitting: Internal Medicine

## 2014-02-27 VITALS — BP 120/80 | Temp 98.2°F | Ht 58.5 in | Wt 127.0 lb

## 2014-02-27 DIAGNOSIS — E119 Type 2 diabetes mellitus without complications: Secondary | ICD-10-CM

## 2014-02-27 DIAGNOSIS — M24859 Other specific joint derangements of unspecified hip, not elsewhere classified: Secondary | ICD-10-CM | POA: Insufficient documentation

## 2014-02-27 DIAGNOSIS — Z23 Encounter for immunization: Secondary | ICD-10-CM

## 2014-02-27 DIAGNOSIS — E039 Hypothyroidism, unspecified: Secondary | ICD-10-CM

## 2014-02-27 DIAGNOSIS — E785 Hyperlipidemia, unspecified: Secondary | ICD-10-CM

## 2014-02-27 DIAGNOSIS — M24851 Other specific joint derangements of right hip, not elsewhere classified: Secondary | ICD-10-CM

## 2014-02-27 NOTE — Patient Instructions (Signed)
Diabetes is much better  Walking exercise  Ok but if  Pain dysfunction   Then see sports medicine . Check bp  To ensusre  At goal below 140/90   Wellness and  Labs hga1c in  July when due.

## 2014-02-27 NOTE — Progress Notes (Signed)
Pre visit review using our clinic review tool, if applicable. No additional management support is needed unless otherwise documented below in the visit note.  Chief Complaint  Patient presents with  . Follow-up  . Diabetes    HPI: Debbie Marshall 68 y.o. comesin todaay for Chronic disease management    DM:  Changed to brown rice  Diet change  BP at goal at  Home whenchecked 120/80 range   Has noted right lateral hip area with sounds when walks no ass pain numbness dysfunction weakness   Makes her nervous to hear sounds when walking .  No hip pain knee pain with this .  ROS: See pertinent positives and negatives per HPI. Has minor cold no fver ? Flu vaccine   Past Medical History  Diagnosis Date  . VITAMIN D DEFICIENCY 08/14/2009  . ABNORMAL TRANSAMINASE, (LFT'S) 11/14/2008     2005 US shows fatty liver  . FASTING HYPERGLYCEMIA 09/07/2006  . ABNORMAL EXAM-BILIARY TRACT 11/14/2008  . Nonspecific abnormal results of liver function study 09/07/2006  . OSTEOARTHRITIS 09/07/2006  . GERD 09/07/2006  . HYPERLIPIDEMIA 09/07/2006  . HYPOTHYROIDISM 10/30/2006  . Positive PPD   . Normal nuclear stress test 2005    stress cardiolite   . 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 Diabetes mellitus without complication   . Cancer 1996    tongue cancer    Family History  Problem Relation Age of Onset  . Stroke Mother 46  . Arthritis Mother   . Rheum arthritis Brother   . Arthritis Brother   . Gout Brother   . Colon cancer Other     History   Social History  . Marital Status: Single    Spouse Name: N/A    Number of Children: N/A  . Years of Education: N/A   Occupational History  . PhD Professor    Social History Main Topics  . Smoking status: Never Smoker   . Smokeless tobacco: Never Used  . Alcohol Use: 0.6 oz/week    1 Glasses of wine per week  . Drug Use: No  . Sexual Activity: None   Other Topics Concern  . None   Social History Narrative   HH  of 2 Father and her   No pets    PhD professor in information systems    No tobacco or alcohol exercising regularly currently   House guest this summer  Not working     Outpatient Encounter Prescriptions as of 02/27/2014  Medication Sig  . Cholecalciferol 1000 UNITS capsule Take 1 capsule (1,000 Units total) by mouth daily.  Marland Kitchen glucose blood (ACCU-CHEK AVIVA PLUS) test strip Test twice daily.  Elmore Gu[employer] Devices (ACCU-CHEK SOFTCLIX) lancets Test twice daily.  . metFORMIN (GLUCOPHAGE-XR) 500 MG 24 hr tablet Take 2 tablets (1,000 mg total) by mouth daily.  Marland Kitchen MOVIPREP 100 G SOLR MoviPrep (no substitutions)-TAKE AS DIRECTED.  . Omega-3 Fatty Acids (FISH OIL) 1000 MG CAPS Take by mouth.    . simvastatin (ZOCOR) 40 MG tablet TAKE 1 TABLET BY MOUTH ATBEDTIME  . SYNTHROID 75 MCG tablet TAKE 1 TABLET BY MOUTH DAILY.    EXAM:  BP 120/80 mmHg  Temp(Src) 98.2 F (36.8 C) (Oral)  Ht 4' 10.5" (1.486 m)  Wt 127 lb (57.607 kg)  BMI 26.09 kg/m2  Body mass index is 26.09 kg/(m^2).  GENERAL: vitals reviewed and listed above, alert, oriented, appears well hydrated and in no acute  distress HEENT: atraumatic, conjunctiva  clear, no obvious abnormalities on inspection of external nose and ears  MS: moves all extremities without noticeable focal  abnormality gait  Points to lateral hip area  Nl rom no point tenderness PSYCH: pleasant and cooperative, no obvious depression or anxiety Lab Results  Component Value Date   HGBA1C 6.4 02/20/2014   HGBA1C 6.9* 10/22/2013   HGBA1C 6.7* 04/18/2013   Lab Results  Component Value Date   MICROALBUR 1.4 10/28/2013   LDLCALC 57 10/22/2013   CREATININE 0.6 10/22/2013    ASSESSMENT AND PLAN:  Discussed the following assessment and plan:  Diabetes mellitus type 2, uncomplicated - immproved   Hyperlipidemia  Hip crepitus, right ? - lateral  with activity no assoc sx  or lof  walking ok but if   progressive and nany sx see SM.  Hypothyroidism, unspecified  hypothyroidism type - [employer] ho on company sponsored rx  PV in July late with labs   Make sure  bp in range  -Patient advised to return or notify health care team  if symptoms worsen ,persist or new concerns arise.  Patient Instructions  Diabetes is much better  Walking exercise  Ok but if  Pain dysfunction   Then see sports medicine . Check bp  To ensusre  At goal below 140/90   Wellness and  Labs hga1c in  July when due.    Standley Brooking. Demarco Bacci M.D.

## 2014-04-30 ENCOUNTER — Encounter: Payer: Self-pay | Admitting: Internal Medicine

## 2014-05-27 ENCOUNTER — Other Ambulatory Visit: Payer: Self-pay | Admitting: Internal Medicine

## 2014-05-27 MED ORDER — SYNTHROID 75 MCG PO TABS: 75.0000 ug | ORAL_TABLET | Freq: Every day | ORAL | Status: DC

## 2014-05-27 NOTE — Telephone Encounter (Signed)
Sent to the pharmacy by e-scribe. 

## 2014-05-27 NOTE — Telephone Encounter (Signed)
Pt request refill of the following: SYNTHROID 75 MCG tablet    Phamacy: [place] mail order

## 2014-05-27 NOTE — Telephone Encounter (Signed)
Many [facility] in the system.  Need more information such as address and telephone number.

## 2014-05-27 NOTE — Telephone Encounter (Signed)
Rx sent to the pharmacy.

## 2014-07-25 ENCOUNTER — Other Ambulatory Visit: Payer: Self-pay | Admitting: Internal Medicine

## 2014-07-25 NOTE — Telephone Encounter (Signed)
Sent to the pharmacy by e-scribe. 

## 2014-07-28 ENCOUNTER — Other Ambulatory Visit: Payer: Self-pay | Admitting: Internal Medicine

## 2014-10-06 ENCOUNTER — Other Ambulatory Visit: Payer: Self-pay

## 2014-10-27 ENCOUNTER — Encounter: Payer: BC Managed Care – PPO | Admitting: Internal Medicine

## 2014-10-27 ENCOUNTER — Other Ambulatory Visit (INDEPENDENT_AMBULATORY_CARE_PROVIDER_SITE_OTHER): Payer: BLUE CROSS/BLUE SHIELD

## 2014-10-27 DIAGNOSIS — Z0000 Encounter for general adult medical examination without abnormal findings: Secondary | ICD-10-CM | POA: Diagnosis not present

## 2014-10-27 LAB — BASIC METABOLIC PANEL
BUN: 15 mg/dL (ref 6–23)
CHLORIDE: 107 meq/L (ref 96–112)
CO2: 29 meq/L (ref 19–32)
CREATININE: 0.64 mg/dL (ref 0.40–1.20)
Calcium: 9.7 mg/dL (ref 8.4–10.5)
GFR: 98.27 mL/min (ref >60.00)
Glucose, Bld: 109 mg/dL — ABNORMAL HIGH (ref 70–99)
Potassium: 4.5 mEq/L (ref 3.5–5.1)
SODIUM: 144 meq/L (ref 135–145)

## 2014-10-27 LAB — LIPID PANEL
Cholesterol: 140 mg/dL (ref 0–200)
HDL: 50.6 mg/dL (ref >39.00)
LDL CALC: 53 mg/dL (ref 0–99)
NONHDL: 89.4
TRIGLYCERIDES: 184 mg/dL — AB (ref 0.0–149.0)
Total CHOL/HDL Ratio: 3
VLDL: 36.8 mg/dL (ref 0.0–40.0)

## 2014-10-27 LAB — CBC WITH DIFFERENTIAL/PLATELET
BASOS PCT: 0.6 % (ref 0.0–3.0)
Basophils Absolute: 0 10*3/uL (ref 0.0–0.1)
EOS ABS: 0.3 10*3/uL (ref 0.0–0.7)
Eosinophils Relative: 4.6 % (ref 0.0–5.0)
HCT: 43.6 % (ref 36.0–46.0)
Hemoglobin: 14.4 g/dL (ref 12.0–15.0)
LYMPHS ABS: 2.4 10*3/uL (ref 0.7–4.0)
LYMPHS PCT: 37 % (ref 12.0–46.0)
MCHC: 33.1 g/dL (ref 30.0–36.0)
MCV: 91.3 fl (ref 78.0–100.0)
MONOS PCT: 5.3 % (ref 3.0–12.0)
Monocytes Absolute: 0.3 10*3/uL (ref 0.1–1.0)
Neutro Abs: 3.4 10*3/uL (ref 1.4–7.7)
Neutrophils Relative %: 52.5 % (ref 43.0–77.0)
Platelets: 251 10*3/uL (ref 150.0–400.0)
RBC: 4.78 Mil/uL (ref 3.87–5.11)
RDW: 13.6 % (ref 11.5–15.5)
WBC: 6.5 10*3/uL (ref 4.0–10.5)

## 2014-10-27 LAB — HEPATIC FUNCTION PANEL
ALT: 19 U/L (ref 0–35)
AST: 21 U/L (ref 0–37)
Albumin: 4.4 g/dL (ref 3.5–5.2)
Alkaline Phosphatase: 53 U/L (ref 39–117)
BILIRUBIN DIRECT: 0.1 mg/dL (ref 0.0–0.3)
TOTAL PROTEIN: 7 g/dL (ref 6.0–8.3)
Total Bilirubin: 0.5 mg/dL (ref 0.2–1.2)

## 2014-10-27 LAB — TSH: TSH: 3.23 u[IU]/mL (ref 0.35–4.50)

## 2014-10-27 LAB — HEMOGLOBIN A1C: Hgb A1c MFr Bld: 6.4 % (ref 4.6–6.5)

## 2014-11-03 ENCOUNTER — Ambulatory Visit (INDEPENDENT_AMBULATORY_CARE_PROVIDER_SITE_OTHER): Payer: BLUE CROSS/BLUE SHIELD | Admitting: Internal Medicine

## 2014-11-03 ENCOUNTER — Encounter: Payer: Self-pay | Admitting: Internal Medicine

## 2014-11-03 VITALS — BP 134/78 | Temp 98.1°F | Ht <= 58 in | Wt 124.4 lb

## 2014-11-03 DIAGNOSIS — Z23 Encounter for immunization: Secondary | ICD-10-CM

## 2014-11-03 DIAGNOSIS — Z0000 Encounter for general adult medical examination without abnormal findings: Secondary | ICD-10-CM | POA: Diagnosis not present

## 2014-11-03 DIAGNOSIS — E039 Hypothyroidism, unspecified: Secondary | ICD-10-CM

## 2014-11-03 DIAGNOSIS — E119 Type 2 diabetes mellitus without complications: Secondary | ICD-10-CM | POA: Diagnosis not present

## 2014-11-03 DIAGNOSIS — E785 Hyperlipidemia, unspecified: Secondary | ICD-10-CM | POA: Diagnosis not present

## 2014-11-03 DIAGNOSIS — M8580 Other specified disorders of bone density and structure, unspecified site: Secondary | ICD-10-CM | POA: Diagnosis not present

## 2014-11-03 DIAGNOSIS — Z79899 Other long term (current) drug therapy: Secondary | ICD-10-CM

## 2014-11-03 LAB — POCT URINALYSIS DIPSTICK
Bilirubin, UA: NEGATIVE
Ketones, UA: NEGATIVE
Leukocytes, UA: NEGATIVE
NITRITE UA: NEGATIVE
PH UA: 5.5
Protein, UA: NEGATIVE
SPEC GRAV UA: 1.025
UROBILINOGEN UA: 0.2

## 2014-11-03 LAB — MICROALBUMIN / CREATININE URINE RATIO
CREATININE, U: 127.8 mg/dL
Microalb Creat Ratio: 0.5 mg/g (ref 0.0–30.0)
Microalb, Ur: <0.7 mg/dL (ref 0.0–1.9)

## 2014-11-03 NOTE — Progress Notes (Signed)
Pre visit review using our clinic review tool, if applicable. No additional management support is needed unless otherwise documented below in the visit note.  Chief Complaint  Patient presents with  . Annual Exam  . Diabetes  . Hypothyroidism    HPI: Patient  Debbie Marshall  69 y.o. comes in today for Preventive Health Care visit  And Chronic disease management.  DM: no numbness vision changes diet exercise and metformin  Lipids meds Thyroid: still on synthroid different phearmacy taking same dose for years. ? About labs  tg up some  [facility] no exercise related sx . Eating healthy  noreason for some elevation. Sees metformin pill in stool intact ?  Due for mammo  Had to cancel colon for illness in may  No sx   Health Maintenance  Topic Date Due  . OPHTHALMOLOGY EXAM  06/12/1957  . COLONOSCOPY  08/05/2013  . MAMMOGRAM  12/14/2013  . FOOT EXAM  10/29/2014  . URINE MICROALBUMIN  10/29/2014  . INFLUENZA VACCINE  11/10/2014  . HEMOGLOBIN A1C  04/29/2015  . TETANUS/TDAP  06/13/2023  . DEXA SCAN  Completed  . ZOSTAVAX  Completed  . PNA vac Low Risk Adult  Completed   Health Maintenance Review LIFESTYLE:  Exercise:  Walks park   Almost every day  Tobacco/ETS:no Alcohol: per day no rare  Sugar beverages: not often to none  Sleep: 5-6 hours   ROS:  GEN/ HEENT: No fever, significant weight changes sweats headaches vision problems hearing changes, has broken bridge to get fixed  CV/ PULM; No chest pain shortness of breath cough, syncope,edema  change in exercise tolerance. Has some dizziness when sitting long time and rises when in garden?  No ass  and no exercise sx  GI /GU: No adominal pain, vomiting, change in bowel habits. No blood in the stool. No significant GU symptoms. SKIN/HEME: ,no acute skin rashes suspicious lesions or bleeding. No lymphadenopathy, nodules, masses.  NEURO/ PSYCH:  No neurologic signs such as weakness numbness. No depression anxiety. IMM/ Allergy: No  unusual infections.  Allergy .   REST of 12 system review negative except as per HPI   Past Medical History  Diagnosis Date  . VITAMIN D DEFICIENCY 08/14/2009  . ABNORMAL TRANSAMINASE, (LFT'S) 11/14/2008     2005 US shows fatty liver  . FASTING HYPERGLYCEMIA 09/07/2006  . ABNORMAL EXAM-BILIARY TRACT 11/14/2008  . Nonspecific abnormal results of liver function study 09/07/2006  . OSTEOARTHRITIS 09/07/2006  . GERD 09/07/2006  . HYPERLIPIDEMIA 09/07/2006  . HYPOTHYROIDISM 10/30/2006  . Positive PPD   . Normal nuclear stress test 2005    stress cardiolite   . 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 Diabetes mellitus without complication   . Cancer 1996    tongue cancer    Past Surgical History  Procedure Laterality Date  . Salivary gland surgery  1996    Tumor removed on back of tongue and salivary gland    Family History  Problem Relation Age of Onset  . Stroke Mother 82  . Arthritis Mother   . Rheum arthritis Brother   . Arthritis Brother   . Gout Brother   . Colon cancer Other     History   Social History  . Marital Status: Single    Spouse Name: N/A  . Number of Children: N/A  . Years of Education: N/A   Occupational History  . PhD Professor  Social History Main Topics  . Smoking status: Never Smoker   . Smokeless tobacco: Never Used  . Alcohol Use: 0.6 oz/week    1 Glasses of wine per week  . Drug Use: No  . Sexual Activity: Not on file   Other Topics Concern  . None   Social History Narrative   HH of 2 Father and her    cat     PhD professor in information systems    No tobacco or alcohol exercising regularly currently       Outpatient Prescriptions Prior to Visit  Medication Sig Dispense Refill  . Cholecalciferol 1000 UNITS capsule Take 1 capsule (1,000 Units total) by mouth daily. 90 capsule 3  . glucose blood (ACCU-CHEK AVIVA PLUS) test strip Test twice daily. 100 each 12  . Lancet Devices (ACCU-CHEK SOFTCLIX)  lancets Test twice daily. 1 each 12  . metFORMIN (GLUCOPHAGE-XR) 500 MG 24 hr tablet Take 2 tablets (1,000 mg total) by mouth daily. 180 tablet 3  . Omega-3 Fatty Acids (FISH OIL) 1000 MG CAPS Take by mouth.      . simvastatin (ZOCOR) 40 MG tablet TAKE 1 TABLET BY MOUTH DAILY AT BEDTIME 90 tablet 0  . SYNTHROID 75 MCG tablet Take 1 tablet (75 mcg total) by mouth daily. 90 tablet 1  . MOVIPREP 100 G SOLR MoviPrep (no substitutions)-TAKE AS DIRECTED. (Patient not taking: Reported on 11/03/2014) 1 kit 0   No facility-administered medications prior to visit.     EXAM:  BP 134/78 mmHg  Temp(Src) 98.1 F (36.7 C) (Oral)  Ht 4' 10"  (1.473 m)  Wt 124 lb 6.4 oz (56.427 kg)  BMI 26.01 kg/m2  Body mass index is 26.01 kg/(m^2).  Physical Exam: Vital signs reviewed BJS:EGBT is a well-developed well-nourished alert cooperative    who appearsr stated age in no acute distress.  HEENT: normocephalic atraumatic , Eyes: PERRL EOM's full, conjunctiva clear, Nares: paten,t no deformity discharge or tenderness., Ears: no deformity EAC's clear TMs with normal landmarks. Mouth: clear OP, no lesions, edema.  Moist mucous membranes. Dentition in adequate repair. To get bridge fixed  NECK: supple without masses, thyromegaly or bruits. CHEST/PULM:  Clear to auscultation and percussion breath sounds equal no wheeze , rales or rhonchi. No chest wall deformities or tenderness. Breast: normal by inspection . No dimpling, discharge, masses, tenderness or discharge . CV: PMI is nondisplaced, S1 S2 no gallops, murmurs, rubs. Peripheral pulses are full without delay.No JVD .  ABDOMEN: Bowel sounds normal nontender  No guard or rebound, no hepato splenomegal no CVA tenderness.   Extremtities:  No clubbing cyanosis or edema, no acute joint swelling or redness no focal atrophy NEURO:  Oriented x3, cranial nerves 3-12 appear to be intact, no obvious focal weakness,gait within normal limits no abnormal reflexes or  asymmetrical SKIN: No acute rashes normal turgor, color, no bruising or petechiae. PSYCH: Oriented, good eye contact, no obvious depression anxiety, cognition and judgment appear normal. LN: no cervical axillary inguinal adenopathy  Diabetic Foot Exam - Simple   Simple Foot Form  Diabetic Foot exam was performed with the following findings:  Yes 11/03/2014 10:53 AM  Visual Inspection  No deformities, no ulcerations, no other skin breakdown bilaterally:  Yes  Sensation Testing  Intact to touch and monofilament testing bilaterally:  Yes  Pulse Check  Posterior Tibialis and Dorsalis pulse intact bilaterally:  Yes  Comments       Lab Results  Component Value Date   WBC 6.5 10/27/2014  HGB 14.4 10/27/2014   HCT 43.6 10/27/2014   PLT 251.0 10/27/2014   GLUCOSE 109* 10/27/2014   CHOL 140 10/27/2014   TRIG 184.0* 10/27/2014   HDL 50.60 10/27/2014   LDLDIRECT 163.6 10/23/2006   LDLCALC 53 10/27/2014   ALT 19 10/27/2014   AST 21 10/27/2014   NA 144 10/27/2014   K 4.5 10/27/2014   CL 107 10/27/2014   CREATININE 0.64 10/27/2014   BUN 15 10/27/2014   CO2 29 10/27/2014   TSH 3.23 10/27/2014   INR 1.0 ratio 11/14/2008   HGBA1C 6.4 10/27/2014   MICROALBUR <0.7 11/03/2014   BP Readings from Last 3 Encounters:  11/03/14 134/78  02/27/14 120/80  10/28/13 122/74   Wt Readings from Last 3 Encounters:  11/03/14 124 lb 6.4 oz (56.427 kg)  02/27/14 127 lb (57.607 kg)  10/28/13 130 lb (58.968 kg)   Health Maintenance Due  Topic Date Due  . OPHTHALMOLOGY EXAM  06/12/1957  . COLONOSCOPY  08/05/2013  . MAMMOGRAM  12/14/2013  . FOOT EXAM  10/29/2014  . URINE MICROALBUMIN  10/29/2014     ASSESSMENT AND PLAN:  Discussed the following assessment and plan:  Visit for preventive health examination - Plan: Microalbumin / creatinine urine ratio, POCT urinalysis dipstick  Diabetes mellitus type 2, uncomplicated - Plan: Microalbumin / creatinine urine ratio, POCT urinalysis  dipstick  Hypothyroidism, unspecified hypothyroidism type - in range  tsh jump check in 6 months with a1c lab   Hyperlipidemia  Osteopenia - -1.7 2014   check vit d next labs   rpeat dexa    in a year .   Medication management  Need for vaccination with 13-polyvalent pneumococcal conjugate vaccine - Plan: Pneumococcal conjugate vaccine 13-valent Ask pharm about capsule pill No change in meds  Check tsh and vit d nest lavs  On 1000 iu vit d  resched colon get mammo ?s answered to best of ability today .  Patient Care Team: Burnis Medin, MD as PCP - General Patient Instructions  Continue lifestyle intervention healthy eating and exercise . Get y our mammogram  ( breast center)    reschedule your colonoscopy screening  prevnar 13 today  Urine microalbumin ratio today Get repeat bone density  Next year ( 3 year interval from last one.)  Keep up with routine eye checks. Consider asa 81 mg per day   Because of diabetes diagnosis .   Will recheck your tsh at the next lab in 6 months but  Unlikely that a medication dose change is needed   hga1c and tsh pre visit in 6 months     Wanda K. Panosh M.D.

## 2014-11-03 NOTE — Patient Instructions (Addendum)
Continue lifestyle intervention healthy eating and exercise . Get y our mammogram  ( breast center)    reschedule your colonoscopy screening  prevnar 13 today  Urine microalbumin ratio today Get repeat bone density  Next year ( 3 year interval from last one.)  Keep up with routine eye checks. Consider asa 81 mg per day   Because of diabetes diagnosis .   Will recheck your tsh at the next lab in 6 months but  Unlikely that a medication dose change is needed   hga1c and tsh pre visit in 6 months

## 2014-11-04 ENCOUNTER — Other Ambulatory Visit: Payer: Self-pay

## 2014-11-04 DIAGNOSIS — Z1231 Encounter for screening mammogram for malignant neoplasm of breast: Secondary | ICD-10-CM

## 2014-11-10 ENCOUNTER — Ambulatory Visit
Admission: RE | Admit: 2014-11-10 | Discharge: 2014-11-10 | Disposition: A | Discharge status: Home / Self Care | Payer: BLUE CROSS/BLUE SHIELD | Source: Ambulatory Visit

## 2014-11-10 DIAGNOSIS — Z1231 Encounter for screening mammogram for malignant neoplasm of breast: Secondary | ICD-10-CM

## 2014-11-10 LAB — HM MAMMOGRAPHY

## 2014-11-11 ENCOUNTER — Encounter: Payer: Self-pay | Admitting: Internal Medicine

## 2014-11-12 ENCOUNTER — Encounter: Payer: Self-pay | Admitting: Cardiology

## 2014-11-19 ENCOUNTER — Encounter: Payer: Self-pay | Admitting: Family Medicine

## 2014-11-19 LAB — HM DIABETES EYE EXAM

## 2014-12-30 ENCOUNTER — Ambulatory Visit (AMBULATORY_SURGERY_CENTER): Payer: Self-pay | Admitting: *Deleted

## 2014-12-30 VITALS — Ht 60.0 in | Wt 122.8 lb

## 2014-12-30 DIAGNOSIS — Z1211 Encounter for screening for malignant neoplasm of colon: Secondary | ICD-10-CM

## 2014-12-30 NOTE — Progress Notes (Signed)
No egg or soy allergy  No anesthesia or intubation problems per pt  No diet medications taken  Registered in [place]  Pt has Moviprep at home, unopened, from cancelled procedure in 2015.  I asked her to check the expiration date on the box and to call if medication expired.  Understanding voiced

## 2015-01-13 ENCOUNTER — Ambulatory Visit (AMBULATORY_SURGERY_CENTER): Payer: BLUE CROSS/BLUE SHIELD | Admitting: Internal Medicine

## 2015-01-13 ENCOUNTER — Encounter: Payer: Self-pay | Admitting: Internal Medicine

## 2015-01-13 VITALS — BP 106/61 | HR 57 | Temp 98.3°F | Resp 18 | Ht 60.0 in | Wt 122.0 lb

## 2015-01-13 DIAGNOSIS — D124 Benign neoplasm of descending colon: Secondary | ICD-10-CM

## 2015-01-13 DIAGNOSIS — Z1211 Encounter for screening for malignant neoplasm of colon: Secondary | ICD-10-CM | POA: Diagnosis present

## 2015-01-13 DIAGNOSIS — D122 Benign neoplasm of ascending colon: Secondary | ICD-10-CM | POA: Diagnosis not present

## 2015-01-13 MED ORDER — SODIUM CHLORIDE 0.9 % IV SOLN: 500.0000 mL | INTRAVENOUS | Status: DC

## 2015-01-13 NOTE — Op Note (Signed)
[place]  [address]. [place], [address]   COLONOSCOPY PROCEDURE REPORT  PATIENT: Debbie Marshall, Debbie Marshall  MR#: [id number] BIRTHDATE: Oct 15, 1945 , 69  yrs. old GENDER: female ENDOSCOPIST: Eustace Quail, MD REFERRED MP:NTIRW Darnelle Going, M.D. PROCEDURE DATE:  01/13/2015 PROCEDURE:   Colonoscopy, screening and Colonoscopy with snare polypectomy X3 First Screening Colonoscopy - Avg.  risk and is 50 yrs.  old or older - No.  Prior Negative Screening - Now for repeat screening. 10 or more years since last screening  History of Adenoma - Now for follow-up colonoscopy & has been > or = to 3 yrs.  N/A  Polyps removed today? Yes ASA CLASS:   Class II INDICATIONS:Screening for colonic neoplasia and Colorectal Neoplasm Risk Assessment for this procedure is average risk.. Index exam 2005 (negative) MEDICATIONS: Monitored anesthesia care and Propofol 250 mg IV  DESCRIPTION OF PROCEDURE:   After the risks benefits and alternatives of the procedure were thoroughly explained, informed consent was obtained.  The digital rectal exam revealed no abnormalities of the rectum.   The LB [id number]  endoscope was introduced through the anus and advanced to the cecum, which was identified by both the appendix and ileocecal valve. No adverse events experienced.   The quality of the prep was excellent. (MoviPrep was used)  The instrument was then slowly withdrawn as the colon was fully examined. Estimated blood loss is zero unless otherwise noted in this procedure report.  COLON FINDINGS: Three polyps ranging between 3-55mm in size were found in the descending colon and ascending colon.  A polypectomy was performed with a cold snare.  The resection was complete, the polyp tissue was completely retrieved and sent to histology. There was moderate diverticulosis noted in the right colon and sigmoid colon.   The examination was otherwise normal.  Retroflexed views revealed internal  hemorrhoids. The time to cecum = 3.9 Withdrawal time = 13.9   The scope was withdrawn and the procedure completed. COMPLICATIONS: There were no immediate complications.  ENDOSCOPIC IMPRESSION: 1.   Three polyps were found in the descending colon and ascending colon; polypectomy was performed with a cold snare 2.   Moderate diverticulosis was noted in the right colon and sigmoid colon 3.   The examination was otherwise normal  RECOMMENDATIONS: 1. Repeat Colonoscopy in 3 years if all polyps adenomatous, otherwise 5 years.  eSigned:  Eustace Quail, MD 01/13/2015 10:50 AM   cc: The Patient and Burnis Medin, MD

## 2015-01-13 NOTE — Patient Instructions (Signed)
YOU HAD AN ENDOSCOPIC PROCEDURE TODAY AT THE [facility]:   Refer to the procedure report that was given to you for any specific questions about what was found during the examination.  If the procedure report does not answer your questions, please call your gastroenterologist to clarify.  If you requested that your care partner not be given the details of your procedure findings, then the procedure report has been included in a sealed envelope for you to review at your convenience later.  YOU SHOULD EXPECT: Some feelings of bloating in the abdomen. Passage of more gas than usual.  Walking can help get rid of the air that was put into your GI tract during the procedure and reduce the bloating. If you had a lower endoscopy (such as a colonoscopy or flexible sigmoidoscopy) you may notice spotting of blood in your stool or on the toilet paper. If you underwent a bowel prep for your procedure, you may not have a normal bowel movement for a few days.  Please Note:  You might notice some irritation and congestion in your nose or some drainage.  This is from the oxygen used during your procedure.  There is no need for concern and it should clear up in a day or so.  SYMPTOMS TO REPORT IMMEDIATELY:   Following lower endoscopy (colonoscopy or flexible sigmoidoscopy):  Excessive amounts of blood in the stool  Significant tenderness or worsening of abdominal pains  Swelling of the abdomen that is new, acute  Fever of 100F or higher   For urgent or emergent issues, a gastroenterologist can be reached at any hour by calling [phone].   DIET: Your first meal following the procedure should be a small meal and then it is ok to progress to your normal diet. Heavy or fried foods are harder to digest and may make you feel nauseous or bloated.  Likewise, meals heavy in dairy and vegetables can increase bloating.  Drink plenty of fluids but you should avoid alcoholic beverages for 24  hours.  ACTIVITY:  You should plan to take it easy for the rest of today and you should NOT DRIVE or use heavy machinery until tomorrow (because of the sedation medicines used during the test).    FOLLOW UP: Our staff will call the number listed on your records the next business day following your procedure to check on you and address any questions or concerns that you may have regarding the information given to you following your procedure. If we do not reach you, we will leave a message.  However, if you are feeling well and you are not experiencing any problems, there is no need to return our call.  We will assume that you have returned to your regular daily activities without incident.  If any biopsies were taken you will be contacted by phone or by letter within the next 1-3 weeks.  Please call us at [phone] if you have not heard about the biopsies in 3 weeks.    SIGNATURES/CONFIDENTIALITY: You and/or your care partner have signed paperwork which will be entered into your electronic medical record.  These signatures attest to the fact that that the information above on your After Visit Summary has been reviewed and is understood.  Full responsibility of the confidentiality of this discharge information lies with you and/or your care-partner.  Polyp, diverticulosis, and high fiber diet information given.  

## 2015-01-13 NOTE — Progress Notes (Signed)
Transferred to recovery room. A/O x3, pleased with MAC.  VSS.  Report to Jane, RN. 

## 2015-01-13 NOTE — Progress Notes (Signed)
Called to room to assist during endoscopic procedure.  Patient ID and intended procedure confirmed with present staff. Received instructions for my participation in the procedure from the performing physician.  

## 2015-01-14 ENCOUNTER — Telehealth: Payer: Self-pay | Admitting: *Deleted

## 2015-01-14 NOTE — Telephone Encounter (Signed)
  Follow up Call-  No flowsheet data found.   Patient questions:  Do you have a fever, pain , or abdominal swelling? No. Pain Score  0 *  Have you tolerated food without any problems? Yes.    Have you been able to return to your normal activities? Yes.    Do you have any questions about your discharge instructions: Diet   No. Medications  No. Follow up visit  No.  Do you have questions or concerns about your Care? No.  Actions: * If pain score is 4 or above: No action needed, pain <4.   Follow up Call-  No flowsheet data found.   Patient questions:  Do you have a fever, pain , or abdominal swelling? No. Pain Score  0 *  Have you tolerated food without any problems? Yes.    Have you been able to return to your normal activities? Yes.    Do you have any questions about your discharge instructions: Diet   No. Medications  No. Follow up visit  No.  Do you have questions or concerns about your Care? No.  Actions: * If pain score is 4 or above: No action needed, pain <4.

## 2015-01-19 ENCOUNTER — Encounter: Payer: Self-pay | Admitting: Internal Medicine

## 2015-02-23 ENCOUNTER — Other Ambulatory Visit: Payer: Self-pay | Admitting: Internal Medicine

## 2015-02-25 NOTE — Telephone Encounter (Signed)
Sent to the pharmacy by e-scribe.  Pt has upcoming appt in Feb 2017.

## 2015-03-02 ENCOUNTER — Other Ambulatory Visit: Payer: Self-pay | Admitting: Internal Medicine

## 2015-03-03 ENCOUNTER — Ambulatory Visit: Payer: BLUE CROSS/BLUE SHIELD

## 2015-04-02 ENCOUNTER — Ambulatory Visit: Payer: BLUE CROSS/BLUE SHIELD

## 2015-05-06 ENCOUNTER — Other Ambulatory Visit (INDEPENDENT_AMBULATORY_CARE_PROVIDER_SITE_OTHER): Payer: 59

## 2015-05-06 DIAGNOSIS — E559 Vitamin D deficiency, unspecified: Secondary | ICD-10-CM

## 2015-05-06 DIAGNOSIS — E039 Hypothyroidism, unspecified: Secondary | ICD-10-CM

## 2015-05-06 DIAGNOSIS — E119 Type 2 diabetes mellitus without complications: Secondary | ICD-10-CM

## 2015-05-06 LAB — TSH: TSH: 3.91 u[IU]/mL (ref 0.35–4.50)

## 2015-05-06 LAB — VITAMIN D 25 HYDROXY (VIT D DEFICIENCY, FRACTURES): VITD: 24.76 ng/mL — AB (ref 30.00–100.00)

## 2015-05-06 LAB — HEMOGLOBIN A1C: HEMOGLOBIN A1C: 6.3 % (ref 4.6–6.5)

## 2015-05-07 ENCOUNTER — Encounter: Payer: Self-pay | Admitting: Internal Medicine

## 2015-05-14 ENCOUNTER — Ambulatory Visit: Payer: BLUE CROSS/BLUE SHIELD | Admitting: Internal Medicine

## 2015-05-19 ENCOUNTER — Telehealth: Payer: Self-pay | Admitting: Family Medicine

## 2015-05-19 ENCOUNTER — Encounter: Payer: 59 | Admitting: Internal Medicine

## 2015-05-19 NOTE — Telephone Encounter (Signed)
Left a message on cell for the pt to call back and re schedule missed appointment.  Left office telephone number.

## 2015-05-19 NOTE — Patient Instructions (Signed)
    Blood sugars control is good . Vit d slightly low  Wellness labs hga1c in 6 months or thereabouts

## 2015-05-19 NOTE — Progress Notes (Signed)
Document opened and reviewed for OVwellness visit . No showed .   

## 2015-05-26 ENCOUNTER — Encounter: Payer: Self-pay | Admitting: Internal Medicine

## 2015-05-26 ENCOUNTER — Ambulatory Visit (INDEPENDENT_AMBULATORY_CARE_PROVIDER_SITE_OTHER): Payer: 59 | Admitting: Internal Medicine

## 2015-05-26 VITALS — BP 132/76 | Temp 98.3°F | Wt 122.0 lb

## 2015-05-26 DIAGNOSIS — M545 Low back pain: Secondary | ICD-10-CM

## 2015-05-26 DIAGNOSIS — M8580 Other specified disorders of bone density and structure, unspecified site: Secondary | ICD-10-CM | POA: Diagnosis not present

## 2015-05-26 DIAGNOSIS — G5622 Lesion of ulnar nerve, left upper limb: Secondary | ICD-10-CM

## 2015-05-26 DIAGNOSIS — E119 Type 2 diabetes mellitus without complications: Secondary | ICD-10-CM | POA: Diagnosis not present

## 2015-05-26 DIAGNOSIS — E039 Hypothyroidism, unspecified: Secondary | ICD-10-CM

## 2015-05-26 NOTE — Patient Instructions (Signed)
Take  vitamin D.  As you are doing . Diabetes.  is good  Control Back pain seems mechanical and exercise    Stretching... also like yoga . Left elbow ...  Sounds like stretching    Ulnar nerve   .   avoid flexing  And leaning on elbow .Marland Kitchen Ulnar neuritis  Is possible .   Back Exercises The following exercises strengthen the muscles that help to support the back. They also help to keep the lower back flexible. Doing these exercises can help to prevent back pain or lessen existing pain. If you have back pain or discomfort, try doing these exercises 2-3 times each day or as told by your health care provider. When the pain goes away, do them once each day, but increase the number of times that you repeat the steps for each exercise (do more repetitions). If you do not have back pain or discomfort, do these exercises once each day or as told by your health care provider. EXERCISES Single Knee to Chest Repeat these steps 3-5 times for each leg: 1. Lie on your back on a firm bed or the floor with your legs extended. 2. Bring one knee to your chest. Your other leg should stay extended and in contact with the floor. 3. Hold your knee in place by grabbing your knee or thigh. 4. Pull on your knee until you feel a gentle stretch in your lower back. 5. Hold the stretch for 10-30 seconds. 6. Slowly release and straighten your leg. Pelvic Tilt Repeat these steps 5-10 times: 1. Lie on your back on a firm bed or the floor with your legs extended. 2. Bend your knees so they are pointing toward the ceiling and your feet are flat on the floor. 3. Tighten your lower abdominal muscles to press your lower back against the floor. This motion will tilt your pelvis so your tailbone points up toward the ceiling instead of pointing to your feet or the floor. 4. With gentle tension and even breathing, hold this position for 5-10 seconds. Cat-Cow Repeat these steps until your lower back becomes more flexible: 1. Get into  a hands-and-knees position on a firm surface. Keep your hands under your shoulders, and keep your knees under your hips. You may place padding under your knees for comfort. 2. Let your head hang down, and point your tailbone toward the floor so your lower back becomes rounded like the back of a cat. 3. Hold this position for 5 seconds. 4. Slowly lift your head and point your tailbone up toward the ceiling so your back forms a sagging arch like the back of a cow. 5. Hold this position for 5 seconds. Press-Ups Repeat these steps 5-10 times: 1. Lie on your abdomen (face-down) on the floor. 2. Place your palms near your head, about shoulder-width apart. 3. While you keep your back as relaxed as possible and keep your hips on the floor, slowly straighten your arms to raise the top half of your body and lift your shoulders. Do not use your back muscles to raise your upper torso. You may adjust the placement of your hands to make yourself more comfortable. 4. Hold this position for 5 seconds while you keep your back relaxed. 5. Slowly return to lying flat on the floor. Bridges Repeat these steps 10 times: 1. Lie on your back on a firm surface. 2. Bend your knees so they are pointing toward the ceiling and your feet are flat on the floor.  3. Tighten your buttocks muscles and lift your buttocks off of the floor until your waist is at almost the same height as your knees. You should feel the muscles working in your buttocks and the back of your thighs. If you do not feel these muscles, slide your feet 1-2 inches farther away from your buttocks. 4. Hold this position for 3-5 seconds. 5. Slowly lower your hips to the starting position, and allow your buttocks muscles to relax completely. If this exercise is too easy, try doing it with your arms crossed over your chest. Abdominal Crunches Repeat these steps 5-10 times: 1. Lie on your back on a firm bed or the floor with your legs extended. 2. Bend your  knees so they are pointing toward the ceiling and your feet are flat on the floor. 3. Cross your arms over your chest. 4. Tip your chin slightly toward your chest without bending your neck. 5. Tighten your abdominal muscles and slowly raise your trunk (torso) high enough to lift your shoulder blades a tiny bit off of the floor. Avoid raising your torso higher than that, because it can put too much stress on your low back and it does not help to strengthen your abdominal muscles. 6. Slowly return to your starting position. Back Lifts Repeat these steps 5-10 times: 1. Lie on your abdomen (face-down) with your arms at your sides, and rest your forehead on the floor. 2. Tighten the muscles in your legs and your buttocks. 3. Slowly lift your chest off of the floor while you keep your hips pressed to the floor. Keep the back of your head in line with the curve in your back. Your eyes should be looking at the floor. 4. Hold this position for 3-5 seconds. 5. Slowly return to your starting position. SEEK MEDICAL CARE IF:  Your back pain or discomfort gets much worse when you do an exercise.  Your back pain or discomfort does not lessen within 2 hours after you exercise. If you have any of these problems, stop doing these exercises right away. Do not do them again unless your health care provider says that you can. SEEK IMMEDIATE MEDICAL CARE IF:  You develop sudden, severe back pain. If this happens, stop doing the exercises right away. Do not do them again unless your health care provider says that you can.   This information is not intended to replace advice given to you by your health care provider. Make sure you discuss any questions you have with your health care provider.   Document Released: 05/05/2004 Document Revised: 12/17/2014 Document Reviewed: 05/22/2014 Elsevier Interactive Patient Education [employer].

## 2015-05-26 NOTE — Progress Notes (Signed)
Chief Complaint  Patient presents with  . Diabetes  . Back Pain    HPI: Debbie Marshall 70 y.o.  patient with hypothyroid on replacement hyperlipidemia well-controlled diabetes and osteopenia with borderline low vitamin D comes in today for follow-up visit. She has done pretty well with her medicines as questions about their safety. Couple of new things concern about  back pain that she states is uncomfortable when she first gets up in the morning recently hasn't been exercising as much. Sometimes it goes down her right leg. There is no associated fever or change in bowel habits falling but sometimes couldn't tell if it's in the right groin area or not no injury. Back almost gone comes in goes  .Marland Kitchen Walking  Not worse  recnetly  Worse in am    Not really sleep induce . Feels off . sometinms to right lateral     . Took Aleve once and it helped a whole lot symptoms do not occur every day.    Used to do walking  At park  Stopped  in the last few months because of time  Also has left elbow problem that she reported before. Medial left sometimes sore in the ulnar groove and left pinky finger tingles or hurts there is no weakness she is right-handed   ROS: See pertinent positives and negatives per HPI. She is taking 2000 international units of vitamin D most days has backed off a little  Past Medical History  Diagnosis Date  . VITAMIN D DEFICIENCY 08/14/2009  . ABNORMAL TRANSAMINASE, (LFT'S) 11/14/2008     2005 US shows fatty liver  . FASTING HYPERGLYCEMIA 09/07/2006  . ABNORMAL EXAM-BILIARY TRACT 11/14/2008  . Nonspecific abnormal results of liver function study 09/07/2006  . OSTEOARTHRITIS 09/07/2006  . GERD 09/07/2006  . HYPERLIPIDEMIA 09/07/2006  . HYPOTHYROIDISM 10/30/2006  . Positive PPD     [place] vaccine [place]- shows positive after receiving  . Normal nuclear stress test 2005    stress cardiolite   . 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 Diabetes  mellitus without complication ([place])   . Cancer ([place]) 1996    tongue cancer    Family History  Problem Relation Age of Onset  . Stroke Mother 74  . Arthritis Mother   . Rheum arthritis Brother   . Arthritis Brother   . Gout Brother   . Colon cancer Other   . Esophageal cancer Neg Hx   . Stomach cancer Neg Hx   . Rectal cancer Neg Hx     Social History   Social History  . Marital Status: Single    Spouse Name: N/A  . Number of Children: N/A  . Years of Education: N/A   Occupational History  . PhD Professor    Social History Main Topics  . Smoking status: Never Smoker   . Smokeless tobacco: Never Used  . Alcohol Use: 0.6 oz/week    1 Glasses of wine per week  . Drug Use: No  . Sexual Activity: Not Asked   Other Topics Concern  . None   Social History Narrative   HH of 2 Father and her    cat     PhD professor in information systems    No tobacco or alcohol exercising regularly currently       Outpatient Prescriptions Prior to Visit  Medication Sig Dispense Refill  . Cholecalciferol 1000 UNITS capsule Take 1  capsule (1,000 Units total) by mouth daily. 90 capsule 3  . glucose blood (ACCU-CHEK AVIVA PLUS) test strip Test twice daily. 100 each 12  . Lancet Devices (ACCU-CHEK SOFTCLIX) lancets Test twice daily. 1 each 12  . metFORMIN (GLUCOPHAGE-XR) 500 MG 24 hr tablet TAKE 2 TABLETS BY MOUTH DAILY. 180 tablet 0  . Omega-3 Fatty Acids (FISH OIL) 1000 MG CAPS Take by mouth.      . simvastatin (ZOCOR) 40 MG tablet TAKE 1 TABLET BY MOUTH ATBEDTIME 90 tablet 0  . SYNTHROID 75 MCG tablet TAKE 1 TABLET (75 MCG) DAILY 90 tablet 0   No facility-administered medications prior to visit.     EXAM:  BP 132/76 mmHg  Temp(Src) 98.3 F (36.8 C)  Wt 122 lb (55.339 kg)  Body mass index is 23.83 kg/(m^2).  GENERAL: vitals reviewed and listed above, alert, oriented, appears well hydrated and in no acute distress HEENT: atraumatic, conjunctiva  clear, no obvious  abnormalities on inspection of external nose and ears  NECK: no obvious masses on inspection palpation   CV: HRRR, no clubbing cyanosis or  peripheral edema nl cap refill  MS: moves all extremities without noticeable focal  Abnormality no midline spine tenderness good range of motion points to the right buttocks hip area and down to the foot when things are aggravated. Negative SLR today. Left stable cord range of motion tenderness along the ulnar groove. No obvious atrophy hands do look like there is some osteoarthritis but no acute synovitis. No unusual plaques are psoriatic rashes. Neurologic appears to be intact DTRs are present normal gait. PSYCH: pleasant and cooperative, no obvious depression or anxiety Lab Results  Component Value Date   WBC 6.5 10/27/2014   HGB 14.4 10/27/2014   HCT 43.6 10/27/2014   PLT 251.0 10/27/2014   GLUCOSE 109* 10/27/2014   CHOL 140 10/27/2014   TRIG 184.0* 10/27/2014   HDL 50.60 10/27/2014   LDLDIRECT 163.6 10/23/2006   LDLCALC 53 10/27/2014   ALT 19 10/27/2014   AST 21 10/27/2014   NA 144 10/27/2014   K 4.5 10/27/2014   CL 107 10/27/2014   CREATININE 0.64 10/27/2014   BUN 15 10/27/2014   CO2 29 10/27/2014   TSH 3.91 05/06/2015   INR 1.0 ratio 11/14/2008   HGBA1C 6.3 05/06/2015   MICROALBUR <0.7 11/03/2014   Vit d  24.76 ASSESSMENT AND PLAN:  Discussed the following assessment and plan:  Type 2 diabetes mellitus without complication, without long-term current use of insulin ([place]) - Extremely well-controlled metformin benefit more than risk discussed track record of this medicine with patient  Hypothyroidism, unspecified hypothyroidism type - Has been in range continue  Osteopenia - Mildly low vitamin D go back to 2000 units a day.  Low back pain, unspecified back pain laterality, with sciatica presence unspecified - Seems mechanical better with activity encourage activity exercises yoga etc. more evaluation of progressive.  Ulnar  neuritis, left - Most likely local phenomenon avoid compression stretching etc. ice more evaluation of progressive cpx lab in July august  With a1c  -Patient advised to return or notify health care team  if symptoms worsen ,persist or new concerns arise.  Patient Instructions  Take  vitamin D.  As you are doing . Diabetes.  is good  Control Back pain seems mechanical and exercise    Stretching... also like yoga . Left elbow ...  Sounds like stretching    Ulnar nerve   .   avoid flexing  And leaning  on elbow .Marland Kitchen Ulnar neuritis  Is possible .   Back Exercises The following exercises strengthen the muscles that help to support the back. They also help to keep the lower back flexible. Doing these exercises can help to prevent back pain or lessen existing pain. If you have back pain or discomfort, try doing these exercises 2-3 times each day or as told by your health care provider. When the pain goes away, do them once each day, but increase the number of times that you repeat the steps for each exercise (do more repetitions). If you do not have back pain or discomfort, do these exercises once each day or as told by your health care provider. EXERCISES Single Knee to Chest Repeat these steps 3-5 times for each leg: 1. Lie on your back on a firm bed or the floor with your legs extended. 2. Bring one knee to your chest. Your other leg should stay extended and in contact with the floor. 3. Hold your knee in place by grabbing your knee or thigh. 4. Pull on your knee until you feel a gentle stretch in your lower back. 5. Hold the stretch for 10-30 seconds. 6. Slowly release and straighten your leg. Pelvic Tilt Repeat these steps 5-10 times: 1. Lie on your back on a firm bed or the floor with your legs extended. 2. Bend your knees so they are pointing toward the ceiling and your feet are flat on the floor. 3. Tighten your lower abdominal muscles to press your lower back against the floor. This motion  will tilt your pelvis so your tailbone points up toward the ceiling instead of pointing to your feet or the floor. 4. With gentle tension and even breathing, hold this position for 5-10 seconds. Cat-Cow Repeat these steps until your lower back becomes more flexible: 1. Get into a hands-and-knees position on a firm surface. Keep your hands under your shoulders, and keep your knees under your hips. You may place padding under your knees for comfort. 2. Let your head hang down, and point your tailbone toward the floor so your lower back becomes rounded like the back of a cat. 3. Hold this position for 5 seconds. 4. Slowly lift your head and point your tailbone up toward the ceiling so your back forms a sagging arch like the back of a cow. 5. Hold this position for 5 seconds. Press-Ups Repeat these steps 5-10 times: 1. Lie on your abdomen (face-down) on the floor. 2. Place your palms near your head, about shoulder-width apart. 3. While you keep your back as relaxed as possible and keep your hips on the floor, slowly straighten your arms to raise the top half of your body and lift your shoulders. Do not use your back muscles to raise your upper torso. You may adjust the placement of your hands to make yourself more comfortable. 4. Hold this position for 5 seconds while you keep your back relaxed. 5. Slowly return to lying flat on the floor. Bridges Repeat these steps 10 times: 1. Lie on your back on a firm surface. 2. Bend your knees so they are pointing toward the ceiling and your feet are flat on the floor. 3. Tighten your buttocks muscles and lift your buttocks off of the floor until your waist is at almost the same height as your knees. You should feel the muscles working in your buttocks and the back of your thighs. If you do not feel these muscles, slide your feet 1-2 inches  farther away from your buttocks. 4. Hold this position for 3-5 seconds. 5. Slowly lower your hips to the starting  position, and allow your buttocks muscles to relax completely. If this exercise is too easy, try doing it with your arms crossed over your chest. Abdominal Crunches Repeat these steps 5-10 times: 1. Lie on your back on a firm bed or the floor with your legs extended. 2. Bend your knees so they are pointing toward the ceiling and your feet are flat on the floor. 3. Cross your arms over your chest. 4. Tip your chin slightly toward your chest without bending your neck. 5. Tighten your abdominal muscles and slowly raise your trunk (torso) high enough to lift your shoulder blades a tiny bit off of the floor. Avoid raising your torso higher than that, because it can put too much stress on your low back and it does not help to strengthen your abdominal muscles. 6. Slowly return to your starting position. Back Lifts Repeat these steps 5-10 times: 1. Lie on your abdomen (face-down) with your arms at your sides, and rest your forehead on the floor. 2. Tighten the muscles in your legs and your buttocks. 3. Slowly lift your chest off of the floor while you keep your hips pressed to the floor. Keep the back of your head in line with the curve in your back. Your eyes should be looking at the floor. 4. Hold this position for 3-5 seconds. 5. Slowly return to your starting position. SEEK MEDICAL CARE IF:  Your back pain or discomfort gets much worse when you do an exercise.  Your back pain or discomfort does not lessen within 2 hours after you exercise. If you have any of these problems, stop doing these exercises right away. Do not do them again unless your health care provider says that you can. SEEK IMMEDIATE MEDICAL CARE IF:  You develop sudden, severe back pain. If this happens, stop doing the exercises right away. Do not do them again unless your health care provider says that you can.   This information is not intended to replace advice given to you by your health care provider. Make sure you  discuss any questions you have with your health care provider.   Document Released: 05/05/2004 Document Revised: 12/17/2014 Document Reviewed: 05/22/2014 Elsevier Interactive Patient Education 2016 Lavelle K. Quade Ramirez M.D.

## 2015-08-25 ENCOUNTER — Other Ambulatory Visit: Payer: Self-pay | Admitting: Internal Medicine

## 2015-08-25 NOTE — Telephone Encounter (Signed)
Sent to the pharmacy by e-scribe.  Pt has upcoming appt on 11/10/15

## 2015-09-15 ENCOUNTER — Encounter: Payer: Self-pay | Admitting: Family Medicine

## 2015-09-15 ENCOUNTER — Ambulatory Visit (INDEPENDENT_AMBULATORY_CARE_PROVIDER_SITE_OTHER): Payer: 59 | Admitting: Family Medicine

## 2015-09-15 VITALS — BP 130/80 | HR 66 | Temp 98.0°F | Ht 60.0 in | Wt 125.4 lb

## 2015-09-15 DIAGNOSIS — J329 Chronic sinusitis, unspecified: Secondary | ICD-10-CM | POA: Diagnosis not present

## 2015-09-15 MED ORDER — AMOXICILLIN-POT CLAVULANATE 875-125 MG PO TABS: 1.0000 | ORAL_TABLET | Freq: Two times a day (BID) | ORAL | Status: DC

## 2015-09-15 NOTE — Progress Notes (Signed)
HPI:  Acute visit for sinus congestion: -started: 3 weeks ago, not improving, seems to be worsening -symptoms:nasal congestion, sore throat, cough, nose feels stopped up, particularly on the right; sneezing, itchy nose, sinus pain right maxillary at times -denies:fever, SOB, NVD, tooth pain -has tried: Benadryl a few times -sick contacts/travel/risks: no reported flu, strep or tick exposure -Hx of: allergies in the spring, worse this year ROS: See pertinent positives and negatives per HPI.  Past Medical History  Diagnosis Date  . VITAMIN D DEFICIENCY 08/14/2009  . ABNORMAL TRANSAMINASE, (LFT'S) 11/14/2008     2005 US shows fatty liver  . FASTING HYPERGLYCEMIA 09/07/2006  . ABNORMAL EXAM-BILIARY TRACT 11/14/2008  . Nonspecific abnormal results of liver function study 09/07/2006  . OSTEOARTHRITIS 09/07/2006  . GERD 09/07/2006  . HYPERLIPIDEMIA 09/07/2006  . HYPOTHYROIDISM 10/30/2006  . Positive PPD     [place] vaccine [place]- shows positive after receiving  . Normal nuclear stress test 2005    stress cardiolite   . 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 Diabetes mellitus without complication ([place])   . Cancer [facility]) 1996    tongue cancer    Past Surgical History  Procedure Laterality Date  . Salivary gland surgery  1996    Tumor removed on back of tongue and salivary gland  . Colonoscopy      Family History  Problem Relation Age of Onset  . Stroke Mother 3  . Arthritis Mother   . Rheum arthritis Brother   . Arthritis Brother   . Gout Brother   . Colon cancer Other   . Esophageal cancer Neg Hx   . Stomach cancer Neg Hx   . Rectal cancer Neg Hx     Social History   Social History  . Marital Status: Single    Spouse Name: N/A  . Number of Children: N/A  . Years of Education: N/A   Occupational History  . PhD Professor    Social History Main Topics  . Smoking status: Never Smoker   . Smokeless tobacco: Never Used  . Alcohol  Use: 0.6 oz/week    1 Glasses of wine per week  . Drug Use: No  . Sexual Activity: Not Asked   Other Topics Concern  . None   Social History Narrative   HH of 2 Father and her    cat     PhD professor in information systems    No tobacco or alcohol exercising regularly currently        Current outpatient prescriptions:  .  Cholecalciferol 1000 UNITS capsule, Take 1 capsule (1,000 Units total) by mouth daily., Disp: 90 capsule, Rfl: 3 .  glucose blood (ACCU-CHEK AVIVA PLUS) test strip, Test twice daily., Disp: 100 each, Rfl: 12 .  Lancet Devices (ACCU-CHEK SOFTCLIX) lancets, Test twice daily., Disp: 1 each, Rfl: 12 .  metFORMIN (GLUCOPHAGE-XR) 500 MG 24 hr tablet, TAKE 2 TABLETS BY MOUTH DAILY., Disp: 180 tablet, Rfl: 0 .  Omega-3 Fatty Acids (FISH OIL) 1000 MG CAPS, Take by mouth.  , Disp: , Rfl:  .  simvastatin (ZOCOR) 40 MG tablet, TAKE 1 TABLET BY MOUTH ATBEDTIME, Disp: 90 tablet, Rfl: 0 .  SYNTHROID 75 MCG tablet, TAKE 1 TABLET (75 MCG) DAILY, Disp: 90 tablet, Rfl: 0 .  amoxicillin-clavulanate (AUGMENTIN) 875-125 MG tablet, Take 1 tablet by mouth 2 (two) times daily., Disp: 14 tablet, Rfl: 0  EXAM:  Filed Vitals:  09/15/15 1602  BP: 130/80  Pulse: 66  Temp: 98 F (36.7 C)    Body mass index is 24.49 kg/(m^2).  GENERAL: vitals reviewed and listed above, alert, oriented, appears well hydrated and in no acute distress  HEENT: atraumatic, conjunttiva clear, no obvious abnormalities on inspection of external nose and ears, normal appearance of ear canals and TMs, Thick nasal congestion R> L with possible polyp on the right, mild post oropharyngeal erythema with PND, no tonsillar edema or exudate, no sinus TTP  NECK: no obvious masses on inspection  LUNGS: clear to auscultation bilaterally, no wheezes, rales or rhonchi, good air movement  CV: HRRR, no peripheral edema  MS: moves all extremities without noticeable abnormality  PSYCH: pleasant and cooperative, no  obvious depression or anxiety  ASSESSMENT AND PLAN:  Discussed the following assessment and plan:  Rhinosinusitis  -I think she probably has a bacterial sinus infection secondary to uncontrolled allergic rhinitis. She may have a nasal polyp on the right. Treat with antibiotic,INS and antihistamine. Follow-up in 3 weeks. Sooner if worsening or symptoms not improving. Risk discussed. -of course, we advised to return or notify a doctor immediately if symptoms worsen or persist or new concerns arise.    Patient Instructions  BEFORE YOU LEAVE: -schedule follow up in about 3-4 weeks  Take the antibiotic, Augmentin, as instructed.  Flonase nasal spray 2 sprays each nostril daily. This is available over-the-counter.  Zyrtec once daily at night before bed. This is available over-the-counter.  Follow-up sooner if worsening symptoms or new concerns.    Colin Benton R.

## 2015-09-15 NOTE — Patient Instructions (Addendum)
BEFORE YOU LEAVE: -schedule follow up in about 3-4 weeks  Take the antibiotic, Augmentin, as instructed.  Flonase nasal spray 2 sprays each nostril daily. This is available over-the-counter.  Zyrtec once daily at night before bed. This is available over-the-counter.  Follow-up sooner if worsening symptoms, even not feeling better with treatment or new concerns.

## 2015-09-15 NOTE — Progress Notes (Signed)
Pre visit review using our clinic review tool, if applicable. No additional management support is needed unless otherwise documented below in the visit note. 

## 2015-09-22 ENCOUNTER — Other Ambulatory Visit: Payer: Self-pay | Admitting: Family Medicine

## 2015-09-22 ENCOUNTER — Encounter: Payer: Self-pay | Admitting: Family Medicine

## 2015-09-22 NOTE — Telephone Encounter (Signed)
We just recently prescribed this antibiotic. If doing better, but still mild symptoms ok to refill x 3 more days. #6. Would advise she see her PCP if persistent symptoms after that or if not feeling any better. Thanks.

## 2015-09-22 NOTE — Telephone Encounter (Signed)
I left a message for the pt to return my call. 

## 2015-09-28 ENCOUNTER — Other Ambulatory Visit: Payer: Self-pay | Admitting: Family Medicine

## 2015-09-29 NOTE — Telephone Encounter (Signed)
DENIED.  PATIENT MUST SEE DR. [place] IF NOT BETTER

## 2015-10-01 ENCOUNTER — Encounter: Payer: Self-pay | Admitting: Internal Medicine

## 2015-10-02 NOTE — Telephone Encounter (Signed)
Misty, can you ask Dr.Panosh about this message she is her pt but Dr.Kim saw her last and she is not here. Thanks

## 2015-10-02 NOTE — Telephone Encounter (Signed)
Debbie Marshall  May need appt next week if not improving see message

## 2015-10-05 ENCOUNTER — Ambulatory Visit (INDEPENDENT_AMBULATORY_CARE_PROVIDER_SITE_OTHER): Payer: 59 | Admitting: Internal Medicine

## 2015-10-05 ENCOUNTER — Encounter: Payer: Self-pay | Admitting: Internal Medicine

## 2015-10-05 ENCOUNTER — Other Ambulatory Visit: Payer: Self-pay | Admitting: Internal Medicine

## 2015-10-05 ENCOUNTER — Ambulatory Visit (INDEPENDENT_AMBULATORY_CARE_PROVIDER_SITE_OTHER)
Admission: RE | Admit: 2015-10-05 | Discharge: 2015-10-05 | Disposition: A | Discharge status: Home / Self Care | Payer: 59 | Source: Ambulatory Visit | Attending: Internal Medicine | Admitting: Internal Medicine

## 2015-10-05 VITALS — BP 134/82 | HR 68 | Temp 98.6°F | Ht 60.0 in | Wt 127.0 lb

## 2015-10-05 DIAGNOSIS — E119 Type 2 diabetes mellitus without complications: Secondary | ICD-10-CM

## 2015-10-05 DIAGNOSIS — J40 Bronchitis, not specified as acute or chronic: Secondary | ICD-10-CM | POA: Diagnosis not present

## 2015-10-05 DIAGNOSIS — J069 Acute upper respiratory infection, unspecified: Secondary | ICD-10-CM

## 2015-10-05 DIAGNOSIS — J310 Chronic rhinitis: Secondary | ICD-10-CM

## 2015-10-05 DIAGNOSIS — J329 Chronic sinusitis, unspecified: Secondary | ICD-10-CM

## 2015-10-05 MED ORDER — ALBUTEROL SULFATE (2.5 MG/3ML) 0.083% IN NEBU: 2.5000 mg | INHALATION_SOLUTION | RESPIRATORY_TRACT | Status: DC

## 2015-10-05 MED ORDER — LEVOFLOXACIN 500 MG PO TABS: 500.0000 mg | ORAL_TABLET | Freq: Every day | ORAL | Status: DC

## 2015-10-05 MED ORDER — ALBUTEROL SULFATE (2.5 MG/3ML) 0.083% IN NEBU
2.5000 mg | INHALATION_SOLUTION | RESPIRATORY_TRACT | Status: AC
  Administered 2015-10-05: 2.5 mg via RESPIRATORY_TRACT

## 2015-10-05 MED ORDER — PREDNISONE 20 MG PO TABS: ORAL_TABLET | ORAL | Status: DC

## 2015-10-05 MED ORDER — ALBUTEROL SULFATE HFA 108 (90 BASE) MCG/ACT IN AERS: 2.0000 | INHALATION_SPRAY | Freq: Four times a day (QID) | RESPIRATORY_TRACT | Status: DC | PRN

## 2015-10-05 NOTE — Progress Notes (Signed)
Pre visit review using our clinic review tool, if applicable. No additional management support is needed unless otherwise documented below in the visit note.  Chief Complaint  Patient presents with  . Cough  . Nasal Congestion  . Headache  . Sinus Pressure    HPI: Debbie Marshall 70 y.o.  Comes in for  Cough ill  Seen  6 6 by dr Maudie Mercury for sinsuitis and rx with   10 days augmentine and flonase   andtihstamine  The story goes that she has had some upper respiratory congestion that she felt was allergy for a week or 2 and came in to see Dr. Maudie Mercury who treated her for sinusitis with Augmentin for 7 days nasal saline and nasal cortisone. At the end of the medicine she was about 80-90% better with her nasal congestion and got 3 more days of medicine but when called for refill as she was relapsing we denied at and told her to come in. She has developed cough and tightness that she hasn't had before. No fever and chills but the nasal congestion slowly returned bilaterally. No hemoptysis no history of asthma previous lung disease or wheezing. Her cough is comes in spasms and hasn't had a cough like this before.  ROS: See pertinent positives and negatives per HPI. No vomiting  No   Bad cough  No rash   Past Medical History  Diagnosis Date  . VITAMIN D DEFICIENCY 08/14/2009  . ABNORMAL TRANSAMINASE, (LFT'S) 11/14/2008     2005 US shows fatty liver  . FASTING HYPERGLYCEMIA 09/07/2006  . ABNORMAL EXAM-BILIARY TRACT 11/14/2008  . Nonspecific abnormal results of liver function study 09/07/2006  . OSTEOARTHRITIS 09/07/2006  . GERD 09/07/2006  . HYPERLIPIDEMIA 09/07/2006  . HYPOTHYROIDISM 10/30/2006  . Positive PPD     [place] vaccine [place]- shows positive after receiving  . Normal nuclear stress test 2005    stress cardiolite   . 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 Diabetes mellitus without complication ([place])   . Cancer ([place]) 1996    tongue cancer    Family History    Problem Relation Age of Onset  . Stroke Mother 88  . Arthritis Mother   . Rheum arthritis Brother   . Arthritis Brother   . Gout Brother   . Colon cancer Other   . Esophageal cancer Neg Hx   . Stomach cancer Neg Hx   . Rectal cancer Neg Hx     Social History   Social History  . Marital Status: Single    Spouse Name: N/A  . Number of Children: N/A  . Years of Education: N/A   Occupational History  . PhD Professor    Social History Main Topics  . Smoking status: Never Smoker   . Smokeless tobacco: Never Used  . Alcohol Use: 0.6 oz/week    1 Glasses of wine per week  . Drug Use: No  . Sexual Activity: Not Asked   Other Topics Concern  . None   Social History Narrative   HH of 2 Father and her    cat     PhD professor in information systems    No tobacco or alcohol exercising regularly currently       Outpatient Prescriptions Prior to Visit  Medication Sig Dispense Refill  . Cholecalciferol 1000 UNITS capsule Take 1 capsule (1,000 Units total) by mouth daily. 90 capsule 3  . glucose blood (  ACCU-CHEK AVIVA PLUS) test strip Test twice daily. 100 each 12  . Lancet Devices (ACCU-CHEK SOFTCLIX) lancets Test twice daily. 1 each 12  . metFORMIN (GLUCOPHAGE-XR) 500 MG 24 hr tablet TAKE 2 TABLETS BY MOUTH DAILY. 180 tablet 0  . Omega-3 Fatty Acids (FISH OIL) 1000 MG CAPS Take by mouth.      . simvastatin (ZOCOR) 40 MG tablet TAKE 1 TABLET BY MOUTH ATBEDTIME 90 tablet 0  . SYNTHROID 75 MCG tablet TAKE 1 TABLET (75 MCG) DAILY 90 tablet 0  . amoxicillin-clavulanate (AUGMENTIN) 875-125 MG tablet TAKE 1 TABLET BY MOUTH 2 (TWO) TIMES DAILY. 6 tablet 0   No facility-administered medications prior to visit.     EXAM:  BP 134/82 mmHg  Pulse 68  Temp(Src) 98.6 F (37 C) (Oral)  Ht 5' (1.524 m)  Wt 127 lb (57.607 kg)  BMI 24.80 kg/m2  SpO2 97%  Body mass index is 24.8 kg/(m^2).  GENERAL: vitals reviewed and listed above, alert, oriented, appears well hydrated and in no  acute distress She has intermittent rhonchi or cough very nasally congested but nontoxic and has normal respirations. HEENT: atraumatic, conjunctiva  clear, no obvious abnormalities on inspection of external nose and ears nares turbinates congested. OP : no lesion edema or exudate  NECK: no obvious masses on inspection palpation  LUNGS: Bilateral wheezing expiratory rhonchi physical sounds left more than right no retractions. impr after neb  CV: HRRR, no clubbing cyanosis or  peripheral edema nl cap refill  MS: moves all extremities without noticeable focal  abnormality PSYCH: pleasant and cooperative, no obvious depression or anxiety Lab Results  Component Value Date   WBC 6.5 10/27/2014   HGB 14.4 10/27/2014   HCT 43.6 10/27/2014   PLT 251.0 10/27/2014   GLUCOSE 109* 10/27/2014   CHOL 140 10/27/2014   TRIG 184.0* 10/27/2014   HDL 50.60 10/27/2014   LDLDIRECT 163.6 10/23/2006   LDLCALC 53 10/27/2014   ALT 19 10/27/2014   AST 21 10/27/2014   NA 144 10/27/2014   K 4.5 10/27/2014   CL 107 10/27/2014   CREATININE 0.64 10/27/2014   BUN 15 10/27/2014   CO2 29 10/27/2014   TSH 3.91 05/06/2015   INR 1.0 ratio 11/14/2008   HGBA1C 6.3 05/06/2015   MICROALBUR <0.7 11/03/2014    ASSESSMENT AND PLAN:  Discussed the following assessment and plan:  Wheezy bronchitis - Plan: DG Chest 2 View, albuterol (PROVENTIL) (2.5 MG/3ML) 0.083% nebulizer solution 2.5 mg, DISCONTINUED: albuterol (PROVENTIL) (2.5 MG/3ML) 0.083% nebulizer solution 2.5 mg  Rhinosinusitis - relapsing  after augmentin   Protracted URI - Plan: DG Chest 2 View  Type 2 diabetes mellitus without complication, without long-term current use of insulin (HCC)  Relapsing history with adequate Augmentin get chest x-ray unless contraindicated we'll plan Levaquin for 7 days and prednisone for 5 days expectant management. Nebulizer given in office today.  Wheezing rhinchi much improved after albuterol.  -Patient advised to return or  notify health care team  if symptoms worsen ,persist or new concerns arise.  Patient Instructions  Chest improved after nebulizer bronchodilator treatment. Please get chest x-ray at the [facility] office today We'll begin extended spectrum antibiotic( levaquin)  and probably 5 days of prednisone. And inhaler  This could raise her sugar temporarily while you're on medication.  Expect improvement within 3-5 days if not reevaluation. If getting short of breath or a lot worse contact us for advice. Stay on the nasal cortisone the original inflammation could've been from  allergy.     Standley Brooking. Panosh M.D.

## 2015-10-05 NOTE — Patient Instructions (Addendum)
Chest improved after nebulizer bronchodilator treatment. Please get chest x-ray at the [facility] office today We'll begin extended spectrum antibiotic( levaquin)  and probably 5 days of prednisone. And inhaler  This could raise her sugar temporarily while you're on medication.  Expect improvement within 3-5 days if not reevaluation. If getting short of breath or a lot worse contact us for advice. Stay on the nasal cortisone the original inflammation could've been from allergy.

## 2015-10-07 NOTE — Telephone Encounter (Signed)
Sent to the pharmacy by e-scribe. 

## 2015-10-14 NOTE — Progress Notes (Signed)
Document opened and reviewed for OV but appt  NS cancel same day .

## 2015-10-15 ENCOUNTER — Encounter: Payer: 59 | Admitting: Internal Medicine

## 2015-10-15 DIAGNOSIS — Z0289 Encounter for other administrative examinations: Secondary | ICD-10-CM

## 2015-11-03 ENCOUNTER — Other Ambulatory Visit: Payer: Self-pay | Admitting: Family Medicine

## 2015-11-03 DIAGNOSIS — Z0000 Encounter for general adult medical examination without abnormal findings: Secondary | ICD-10-CM

## 2015-11-03 DIAGNOSIS — R7301 Impaired fasting glucose: Secondary | ICD-10-CM

## 2015-11-04 ENCOUNTER — Other Ambulatory Visit (INDEPENDENT_AMBULATORY_CARE_PROVIDER_SITE_OTHER): Payer: 59

## 2015-11-04 DIAGNOSIS — R7301 Impaired fasting glucose: Secondary | ICD-10-CM

## 2015-11-04 DIAGNOSIS — Z0000 Encounter for general adult medical examination without abnormal findings: Secondary | ICD-10-CM | POA: Diagnosis not present

## 2015-11-04 LAB — CBC WITH DIFFERENTIAL/PLATELET
BASOS ABS: 0 10*3/uL (ref 0.0–0.1)
BASOS PCT: 0.6 % (ref 0.0–3.0)
EOS ABS: 0.3 10*3/uL (ref 0.0–0.7)
Eosinophils Relative: 5 % (ref 0.0–5.0)
HEMATOCRIT: 41.6 % (ref 36.0–46.0)
HEMOGLOBIN: 14 g/dL (ref 12.0–15.0)
LYMPHS PCT: 34 % (ref 12.0–46.0)
Lymphs Abs: 2.2 10*3/uL (ref 0.7–4.0)
MCHC: 33.7 g/dL (ref 30.0–36.0)
MCV: 89.6 fl (ref 78.0–100.0)
MONOS PCT: 6.4 % (ref 3.0–12.0)
Monocytes Absolute: 0.4 10*3/uL (ref 0.1–1.0)
NEUTROS ABS: 3.4 10*3/uL (ref 1.4–7.7)
Neutrophils Relative %: 54 % (ref 43.0–77.0)
PLATELETS: 258 10*3/uL (ref 150.0–400.0)
RBC: 4.64 Mil/uL (ref 3.87–5.11)
RDW: 13.3 % (ref 11.5–15.5)
WBC: 6.4 10*3/uL (ref 4.0–10.5)

## 2015-11-04 LAB — HEMOGLOBIN A1C: Hgb A1c MFr Bld: 6.4 % (ref 4.6–6.5)

## 2015-11-04 LAB — HEPATIC FUNCTION PANEL
ALBUMIN: 4.2 g/dL (ref 3.5–5.2)
ALK PHOS: 56 U/L (ref 39–117)
ALT: 21 U/L (ref 0–35)
AST: 21 U/L (ref 0–37)
BILIRUBIN DIRECT: 0.1 mg/dL (ref 0.0–0.3)
Total Bilirubin: 0.4 mg/dL (ref 0.2–1.2)
Total Protein: 6.8 g/dL (ref 6.0–8.3)

## 2015-11-04 LAB — LIPID PANEL
CHOL/HDL RATIO: 4
Cholesterol: 187 mg/dL (ref 0–200)
HDL: 48.7 mg/dL (ref >39.00)
LDL CALC: 108 mg/dL — AB (ref 0–99)
NONHDL: 138.36
TRIGLYCERIDES: 154 mg/dL — AB (ref 0.0–149.0)
VLDL: 30.8 mg/dL (ref 0.0–40.0)

## 2015-11-04 LAB — TSH: TSH: 2.23 u[IU]/mL (ref 0.35–4.50)

## 2015-11-04 LAB — BASIC METABOLIC PANEL
BUN: 21 mg/dL (ref 6–23)
CHLORIDE: 104 meq/L (ref 96–112)
CO2: 29 meq/L (ref 19–32)
CREATININE: 0.64 mg/dL (ref 0.40–1.20)
Calcium: 9.4 mg/dL (ref 8.4–10.5)
GFR: 97.97 mL/min (ref >60.00)
Glucose, Bld: 117 mg/dL — ABNORMAL HIGH (ref 70–99)
Potassium: 4.2 mEq/L (ref 3.5–5.1)
SODIUM: 139 meq/L (ref 135–145)

## 2015-11-09 NOTE — Progress Notes (Deleted)
No chief complaint on file.   HPI: Debbie Marshall 70 y.o. comes in today for Preventive Medicare wellness visit .Since last visit.  Health Maintenance  Topic Date Due  . FOOT EXAM  11/03/2015  . URINE MICROALBUMIN  11/03/2015  . INFLUENZA VACCINE  11/10/2015  . OPHTHALMOLOGY EXAM  11/19/2015  . HEMOGLOBIN A1C  05/06/2016  . MAMMOGRAM  11/09/2016  . COLONOSCOPY  01/12/2018  . TETANUS/TDAP  06/13/2023  . DEXA SCAN  Completed  . ZOSTAVAX  Completed  . Hepatitis C Screening  Completed  . PNA vac Low Risk Adult  Completed   Health Maintenance Review LIFESTYLE:  TAD Sugar beverages: Sleep:    MEDICARE DOCUMENT QUESTIONS  TO SCAN     Hearing:   Vision:  No limitations at present . Last eye check UTD  Safety:  Has smoke detector and wears seat belts.  No firearms. No excess sun exposure. Sees dentist regularly.  Falls:   Advance directive :  Reviewed  Has one.  Memory: Felt to be good  , no concern from her or her family.  Depression: No anhedonia unusual crying or depressive symptoms  Nutrition: Eats well balanced diet; adequate calcium and vitamin D. No swallowing chewing problems.  Injury: no major injuries in the last six months.  Other healthcare providers:  Reviewed today .  Social:  Lives with spouse married. No pets.   Preventive parameters: up-to-date  Reviewed   ADLS:   There are no problems or need for assistance  driving, feeding, obtaining food, dressing, toileting and bathing, managing money using phone. She is independent.  EXERCISE/ HABITS  Per week   No tobacco    etoh   ROS:  GEN/ HEENT: No fever, significant weight changes sweats headaches vision problems hearing changes, CV/ PULM; No chest pain shortness of breath cough, syncope,edema  change in exercise tolerance. GI /GU: No adominal pain, vomiting, change in bowel habits. No blood in the stool. No significant GU symptoms. SKIN/HEME: ,no acute skin rashes suspicious lesions or bleeding. No  lymphadenopathy, nodules, masses.  NEURO/ PSYCH:  No neurologic signs such as weakness numbness. No depression anxiety. IMM/ Allergy: No unusual infections.  Allergy .   REST of 12 system review negative except as per HPI   Past Medical History:  Diagnosis Date  . ABNORMAL EXAM-BILIARY TRACT 11/14/2008  . ABNORMAL TRANSAMINASE, (LFT'S) 11/14/2008    2005 US shows fatty liver  . Cancer ([place]) 1996   tongue cancer  . Diabetes mellitus without complication ([place])   . FASTING HYPERGLYCEMIA 09/07/2006  . GERD 09/07/2006  . HYPERLIPIDEMIA 09/07/2006  . HYPOTHYROIDISM 10/30/2006  . Nonspecific abnormal results of liver function study 09/07/2006  . Normal nuclear stress test 2005   stress cardiolite   . OSTEOARTHRITIS 09/07/2006  . Positive PPD    [place] vaccine [place]- shows positive after receiving  . 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 VITAMIN D DEFICIENCY 08/14/2009    Family History  Problem Relation Age of Onset  . Stroke Mother 69  . Arthritis Mother   . Rheum arthritis Brother   . Arthritis Brother   . Gout Brother   . Colon cancer Other   . Esophageal cancer Neg Hx   . Stomach cancer Neg Hx   . Rectal cancer Neg Hx     Social History   Social History  . Marital status: Single    Spouse name: N/A  .  Number of children: N/A  . Years of education: N/A   Occupational History  . PhD Professor    Social History Main Topics  . Smoking status: Never Smoker  . Smokeless tobacco: Never Used  . Alcohol use 0.6 oz/week    1 Glasses of wine per week  . Drug use: No  . Sexual activity: Not on file   Other Topics Concern  . Not on file   Social History Narrative   HH of 2 Father and her    cat     PhD professor in information systems    No tobacco or alcohol exercising regularly currently       Outpatient Encounter Prescriptions as of 11/10/2015  Medication Sig  . albuterol (PROAIR HFA) 108 (90 Base) MCG/ACT inhaler Inhale 2  puffs into the lungs every 6 (six) hours as needed for wheezing or shortness of breath.  . Cholecalciferol 1000 UNITS capsule Take 1 capsule (1,000 Units total) by mouth daily.  Marland Kitchen glucose blood (ACCU-CHEK AVIVA PLUS) test strip Test twice daily.  Elmore Guise Devices (ACCU-CHEK SOFTCLIX) lancets Test twice daily.  Marland Kitchen levofloxacin (LEVAQUIN) 500 MG tablet Take 1 tablet (500 mg total) by mouth daily.  . metFORMIN (GLUCOPHAGE-XR) 500 MG 24 hr tablet TAKE 2 TABLETS BY MOUTH DAILY.  Marland Kitchen Omega-3 Fatty Acids (FISH OIL) 1000 MG CAPS Take by mouth.    . predniSONE (DELTASONE) 20 MG tablet 2 po qd for 5 days ,or as directed  . simvastatin (ZOCOR) 40 MG tablet TAKE 1 TABLET BY MOUTH ATBEDTIME  . SYNTHROID 75 MCG tablet TAKE 1 TABLET DAILY   No facility-administered encounter medications on file as of 11/10/2015.     EXAM:  There were no vitals taken for this visit.  There is no height or weight on file to calculate BMI.  Physical Exam: Vital signs reviewed [id number] is a well-developed well-nourished alert cooperative   who appears stated age in no acute distress.  HEENT: normocephalic atraumatic , Eyes: PERRL EOM's full, conjunctiva clear, Nares: paten,t no deformity discharge or tenderness., Ears: no deformity EAC's clear TMs with normal landmarks. Mouth: clear OP, no lesions, edema.  Moist mucous membranes. Dentition in adequate repair. NECK: supple without masses, thyromegaly or bruits. CHEST/PULM:  Clear to auscultation and percussion breath sounds equal no wheeze , rales or rhonchi. No chest wall deformities or tenderness. CV: PMI is nondisplaced, S1 S2 no gallops, murmurs, rubs. Peripheral pulses are full without delay.No JVD .  ABDOMEN: Bowel sounds normal nontender  No guard or rebound, no hepato splenomegal no CVA tenderness.   Extremtities:  No clubbing cyanosis or edema, no acute joint swelling or redness no focal atrophy NEURO:  Oriented x3, cranial nerves 3-12 appear to be intact, no obvious  focal weakness,gait within normal limits no abnormal reflexes or asymmetrical SKIN: No acute rashes normal turgor, color, no bruising or petechiae. PSYCH: Oriented, good eye contact, no obvious depression anxiety, cognition and judgment appear normal. LN: no cervical axillary inguinal adenopathy No noted deficits in memory, attention, and speech.   Lab Results  Component Value Date   WBC 6.4 11/04/2015   HGB 14.0 11/04/2015   HCT 41.6 11/04/2015   PLT 258.0 11/04/2015   GLUCOSE 117 (H) 11/04/2015   CHOL 187 11/04/2015   TRIG 154.0 (H) 11/04/2015   HDL 48.70 11/04/2015   LDLDIRECT 163.6 10/23/2006   LDLCALC 108 (H) 11/04/2015   ALT 21 11/04/2015   AST 21 11/04/2015   NA 139 11/04/2015  K 4.2 11/04/2015   CL 104 11/04/2015   CREATININE 0.64 11/04/2015   BUN 21 11/04/2015   CO2 29 11/04/2015   TSH 2.23 11/04/2015   INR 1.0 ratio 11/14/2008   HGBA1C 6.4 11/04/2015   MICROALBUR <0.7 11/03/2014    ASSESSMENT AND PLAN:  Discussed the following assessment and plan:  Visit for preventive health examination  Type 2 diabetes mellitus without complication, without long-term current use of insulin (HCC)  Hypothyroidism, unspecified hypothyroidism type Foot eye exam  Patient Care Team: Burnis Medin, MD as PCP - General  There are no Patient Instructions on file for this visit.  Standley Brooking. Bexley Mclester M.D.

## 2015-11-10 ENCOUNTER — Encounter: Payer: 59 | Admitting: Internal Medicine

## 2015-11-13 ENCOUNTER — Encounter: Payer: 59 | Admitting: Internal Medicine

## 2015-11-23 NOTE — Progress Notes (Deleted)
No chief complaint on file.   HPI: Patient  Debbie Marshall  70 y.o. comes in today for Preventive Health Care visit   Health Maintenance  Topic Date Due  . FOOT EXAM  11/03/2015  . URINE MICROALBUMIN  11/03/2015  . INFLUENZA VACCINE  11/10/2015  . OPHTHALMOLOGY EXAM  11/19/2015  . HEMOGLOBIN A1C  05/06/2016  . MAMMOGRAM  11/09/2016  . COLONOSCOPY  01/12/2018  . TETANUS/TDAP  06/13/2023  . DEXA SCAN  Completed  . ZOSTAVAX  Completed  . Hepatitis C Screening  Completed  . PNA vac Low Risk Adult  Completed   Health Maintenance Review LIFESTYLE:  Exercise:   Tobacco/ETS: Alcohol: per day  Sugar beverages: Sleep: Drug use: no    ROS:  GEN/ HEENT: No fever, significant weight changes sweats headaches vision problems hearing changes, CV/ PULM; No chest pain shortness of breath cough, syncope,edema  change in exercise tolerance. GI /GU: No adominal pain, vomiting, change in bowel habits. No blood in the stool. No significant GU symptoms. SKIN/HEME: ,no acute skin rashes suspicious lesions or bleeding. No lymphadenopathy, nodules, masses.  NEURO/ PSYCH:  No neurologic signs such as weakness numbness. No depression anxiety. IMM/ Allergy: No unusual infections.  Allergy .   REST of 12 system review negative except as per HPI   Past Medical History:  Diagnosis Date  . ABNORMAL EXAM-BILIARY TRACT 11/14/2008  . ABNORMAL TRANSAMINASE, (LFT'S) 11/14/2008    2005 US shows fatty liver  . Cancer ([place]) 1996   tongue cancer  . Diabetes mellitus without complication ([place])   . FASTING HYPERGLYCEMIA 09/07/2006  . GERD 09/07/2006  . HYPERLIPIDEMIA 09/07/2006  . HYPOTHYROIDISM 10/30/2006  . Nonspecific abnormal results of liver function study 09/07/2006  . Normal nuclear stress test 2005   stress cardiolite   . OSTEOARTHRITIS 09/07/2006  . Positive PPD    [place] vaccine [place]- shows positive after receiving  . 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  VITAMIN D DEFICIENCY 08/14/2009    Past Surgical History:  Procedure Laterality Date  . COLONOSCOPY    . [place]   Tumor removed on back of tongue and salivary gland    Family History  Problem Relation Age of Onset  . Stroke Mother 5  . Arthritis Mother   . Rheum arthritis Brother   . Arthritis Brother   . Gout Brother   . Colon cancer Other   . Esophageal cancer Neg Hx   . Stomach cancer Neg Hx   . Rectal cancer Neg Hx     Social History   Social History  . Marital status: Single    Spouse name: N/A  . Number of children: N/A  . Years of education: N/A   Occupational History  . PhD Professor    Social History Main Topics  . Smoking status: Never Smoker  . Smokeless tobacco: Never Used  . Alcohol use 0.6 oz/week    1 Glasses of wine per week  . Drug use: No  . Sexual activity: Not on file   Other Topics Concern  . Not on file   Social History Narrative   HH of 2 Father and her    cat     PhD professor in information systems    No tobacco or alcohol exercising regularly currently       Outpatient Medications Prior to Visit  Medication Sig Dispense Refill  . albuterol (  PROAIR HFA) 108 (90 Base) MCG/ACT inhaler Inhale 2 puffs into the lungs every 6 (six) hours as needed for wheezing or shortness of breath. 1 Inhaler 1  . Cholecalciferol 1000 UNITS capsule Take 1 capsule (1,000 Units total) by mouth daily. 90 capsule 3  . glucose blood (ACCU-CHEK AVIVA PLUS) test strip Test twice daily. 100 each 12  . Lancet Devices (ACCU-CHEK SOFTCLIX) lancets Test twice daily. 1 each 12  . levofloxacin (LEVAQUIN) 500 MG tablet Take 1 tablet (500 mg total) by mouth daily. 7 tablet 0  . metFORMIN (GLUCOPHAGE-XR) 500 MG 24 hr tablet TAKE 2 TABLETS BY MOUTH DAILY. 180 tablet 0  . Omega-3 Fatty Acids (FISH OIL) 1000 MG CAPS Take by mouth.      . predniSONE (DELTASONE) 20 MG tablet 2 po qd for 5 days ,or as directed 10 tablet 0  . simvastatin (ZOCOR) 40 MG  tablet TAKE 1 TABLET BY MOUTH ATBEDTIME 90 tablet 0  . SYNTHROID 75 MCG tablet TAKE 1 TABLET DAILY 90 tablet 1   No facility-administered medications prior to visit.      EXAM:  There were no vitals taken for this visit.  There is no height or weight on file to calculate BMI.  Physical Exam: Vital signs reviewed [id number] is a well-developed well-nourished alert cooperative    who appearsr stated age in no acute distress.  HEENT: normocephalic atraumatic , Eyes: PERRL EOM's full, conjunctiva clear, Nares: paten,t no deformity discharge or tenderness., Ears: no deformity EAC's clear TMs with normal landmarks. Mouth: clear OP, no lesions, edema.  Moist mucous membranes. Dentition in adequate repair. NECK: supple without masses, thyromegaly or bruits. CHEST/PULM:  Clear to auscultation and percussion breath sounds equal no wheeze , rales or rhonchi. No chest wall deformities or tenderness. CV: PMI is nondisplaced, S1 S2 no gallops, murmurs, rubs. Peripheral pulses are full without delay.No JVD .  ABDOMEN: Bowel sounds normal nontender  No guard or rebound, no hepato splenomegal no CVA tenderness.  No hernia. Extremtities:  No clubbing cyanosis or edema, no acute joint swelling or redness no focal atrophy NEURO:  Oriented x3, cranial nerves 3-12 appear to be intact, no obvious focal weakness,gait within normal limits no abnormal reflexes or asymmetrical SKIN: No acute rashes normal turgor, color, no bruising or petechiae. PSYCH: Oriented, good eye contact, no obvious depression anxiety, cognition and judgment appear normal. LN: no cervical axillary inguinal adenopathy  Lab Results  Component Value Date   WBC 6.4 11/04/2015   HGB 14.0 11/04/2015   HCT 41.6 11/04/2015   PLT 258.0 11/04/2015   GLUCOSE 117 (H) 11/04/2015   CHOL 187 11/04/2015   TRIG 154.0 (H) 11/04/2015   HDL 48.70 11/04/2015   LDLDIRECT 163.6 10/23/2006   LDLCALC 108 (H) 11/04/2015   ALT 21 11/04/2015   AST 21  11/04/2015   NA 139 11/04/2015   K 4.2 11/04/2015   CL 104 11/04/2015   CREATININE 0.64 11/04/2015   BUN 21 11/04/2015   CO2 29 11/04/2015   TSH 2.23 11/04/2015   INR 1.0 ratio 11/14/2008   HGBA1C 6.4 11/04/2015   MICROALBUR <0.7 11/03/2014    ASSESSMENT AND PLAN:  Discussed the following assessment and plan:  Visit for preventive health examination  Type 2 diabetes mellitus without complication, without long-term current use of insulin ([place])  Hyperlipidemia  Hypothyroidism, unspecified hypothyroidism type Urine protein? Foot exam Patient Care Team: Burnis Medin, MD as PCP - General There are no Patient Instructions on file for this  visit.  Standley Brooking. Pheonix Wisby M.D.

## 2015-11-24 ENCOUNTER — Encounter: Payer: 59 | Admitting: Internal Medicine

## 2015-11-25 ENCOUNTER — Encounter: Payer: Self-pay | Admitting: Internal Medicine

## 2015-11-25 ENCOUNTER — Ambulatory Visit (INDEPENDENT_AMBULATORY_CARE_PROVIDER_SITE_OTHER): Payer: 59 | Admitting: Internal Medicine

## 2015-11-25 VITALS — BP 136/72 | Temp 98.5°F | Ht <= 58 in | Wt 124.5 lb

## 2015-11-25 DIAGNOSIS — E785 Hyperlipidemia, unspecified: Secondary | ICD-10-CM | POA: Diagnosis not present

## 2015-11-25 DIAGNOSIS — Z0000 Encounter for general adult medical examination without abnormal findings: Secondary | ICD-10-CM | POA: Diagnosis not present

## 2015-11-25 DIAGNOSIS — R7303 Prediabetes: Secondary | ICD-10-CM | POA: Diagnosis not present

## 2015-11-25 DIAGNOSIS — E039 Hypothyroidism, unspecified: Secondary | ICD-10-CM | POA: Diagnosis not present

## 2015-11-25 MED ORDER — SIMVASTATIN 40 MG PO TABS: ORAL_TABLET | ORAL | 3 refills | Status: DC

## 2015-11-25 NOTE — Progress Notes (Signed)
Pre visit review using our clinic review tool, if applicable. No additional management support is needed unless otherwise documented below in the visit note.  Chief Complaint  Patient presents with  . Annual Exam    HPI: Patient  Debbie Marshall  70 y.o. comes in today for Preventive Health Care visit  resp infection and wheeze a lot better  Sugars around 120 in am  Taking metformin 1000 per day Going to get back with exercise . LIPID  Nose of med needs refill off and on for 4 months so levels not as optimum. Gets swelling in am hands then resolves no sig pain numbness  ? arthritis  Health Maintenance  Topic Date Due  . FOOT EXAM  11/03/2015  . URINE MICROALBUMIN  11/03/2015  . INFLUENZA VACCINE  11/10/2015  . OPHTHALMOLOGY EXAM  11/19/2015  . HEMOGLOBIN A1C  05/06/2016  . MAMMOGRAM  11/09/2016  . COLONOSCOPY  01/12/2018  . TETANUS/TDAP  06/13/2023  . DEXA SCAN  Completed  . ZOSTAVAX  Completed  . Hepatitis C Screening  Completed  . PNA vac Low Risk Adult  Completed   Health Maintenance Review LIFESTYLE:  Exercise:   Back in to last month walking  [facility]  Tobacco/ETS:n Alcohol: n Sugar beverages:n ocass tea coffe w sugar  Sleep: interrupted summer schedule  Drug use: no    ROS:  ocassd right side neck gha pain  And  To back  GEN/ HEENT: No fever, significant weight changes sweats headaches vision problems hearing changes, CV/ PULM; No chest pain shortness of breath cough, syncope,edema  change in exercise tolerance. GI /GU: No adominal pain, vomiting, change in bowel habits. No blood in the stool. No significant GU symptoms. SKIN/HEME: ,no acute skin rashes suspicious lesions or bleeding. No lymphadenopathy, nodules, masses.  NEURO/ PSYCH:  No neurologic signs such as weakness numbness. No depression anxiety. IMM/ Allergy: No unusual infections.  Allergy .   REST of 12 system review negative except as per HPI   Past Medical History:  Diagnosis Date  .  ABNORMAL EXAM-BILIARY TRACT 11/14/2008  . ABNORMAL TRANSAMINASE, (LFT'S) 11/14/2008    2005 US shows fatty liver  . Cancer ([place]) 1996   tongue cancer  . Diabetes mellitus without complication ([place])   . FASTING HYPERGLYCEMIA 09/07/2006  . GERD 09/07/2006  . HYPERLIPIDEMIA 09/07/2006  . HYPOTHYROIDISM 10/30/2006  . Nonspecific abnormal results of liver function study 09/07/2006  . Normal nuclear stress test 2005   stress cardiolite   . OSTEOARTHRITIS 09/07/2006  . Positive PPD    received vaccine in home country- shows positive after receiving  . 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 VITAMIN D DEFICIENCY 08/14/2009    Past Surgical History:  Procedure Laterality Date  . COLONOSCOPY    . [place]   Tumor removed on back of tongue and salivary gland    Family History  Problem Relation Age of Onset  . Stroke Mother 72  . Arthritis Mother   . Rheum arthritis Brother   . Arthritis Brother   . Gout Brother   . Colon cancer Other   . Esophageal cancer Neg Hx   . Stomach cancer Neg Hx   . Rectal cancer Neg Hx     Social History   Social History  . Marital status: Single    Spouse name: N/A  . Number of children: N/A  . Years  of education: N/A   Occupational History  . PhD Professor    Social History Main Topics  . Smoking status: Never Smoker  . Smokeless tobacco: Never Used  . Alcohol use 0.6 oz/week    1 Glasses of wine per week  . Drug use: No  . Sexual activity: Not Asked   Other Topics Concern  . None   Social History Narrative   HH of 2 Father and her    cat     PhD professor in information systems    No tobacco or alcohol exercising regularly currently       Outpatient Medications Prior to Visit  Medication Sig Dispense Refill  . albuterol (PROAIR HFA) 108 (90 Base) MCG/ACT inhaler Inhale 2 puffs into the lungs every 6 (six) hours as needed for wheezing or shortness of breath. 1 Inhaler 1  . Cholecalciferol  1000 UNITS capsule Take 1 capsule (1,000 Units total) by mouth daily. 90 capsule 3  . glucose blood (ACCU-CHEK AVIVA PLUS) test strip Test twice daily. 100 each 12  . Lancet Devices (ACCU-CHEK SOFTCLIX) lancets Test twice daily. 1 each 12  . metFORMIN (GLUCOPHAGE-XR) 500 MG 24 hr tablet TAKE 2 TABLETS BY MOUTH DAILY. 180 tablet 0  . Omega-3 Fatty Acids (FISH OIL) 1000 MG CAPS Take by mouth.      . SYNTHROID 75 MCG tablet TAKE 1 TABLET DAILY 90 tablet 1  . simvastatin (ZOCOR) 40 MG tablet TAKE 1 TABLET BY MOUTH ATBEDTIME 90 tablet 0  . levofloxacin (LEVAQUIN) 500 MG tablet Take 1 tablet (500 mg total) by mouth daily. 7 tablet 0  . predniSONE (DELTASONE) 20 MG tablet 2 po qd for 5 days ,or as directed 10 tablet 0   No facility-administered medications prior to visit.      EXAM:  BP 136/72 (BP Location: Right Arm, Patient Position: Sitting, Cuff Size: Normal)   Temp 98.5 F (36.9 C) (Oral)   Ht 4\' 10"  (1.473 m)   Wt 124 lb 8 oz (56.5 kg)   BMI 26.02 kg/m   Body mass index is 26.02 kg/m.  Physical Exam: Vital signs reviewed [id number] is a well-developed well-nourished alert cooperative    who appearsr stated age in no acute distress.  HEENT: normocephalic atraumatic , Eyes: PERRL EOM's full, conjunctiva clear, Nares: paten,t no deformity discharge or tenderness., Ears: no deformity EAC's clear TMs with normal landmarks. Mouth: clear OP, no lesions, edema.  Moist mucous membranes. Dentition in adequate repair. NECK: supple without masses, thyromegaly or bruits. CHEST/PULM:  Clear to auscultation and percussion breath sounds equal no wheeze , rales or rhonchi. No chest wall deformities or tenderness.Breast: normal by inspection . No dimpling, discharge, masses, tenderness or discharge . CV: PMI is nondisplaced, S1 S2 no gallops, murmurs, rubs. Peripheral pulses are full without delay.No JVD .  ABDOMEN: Bowel sounds normal nontender  No guard or rebound, no hepato splenomegal no CVA  tenderness.  No hernia. Extremtities:  No clubbing cyanosis or edema, no acute joint swelling or redness no focal atrophy NEURO:  Oriented x3, cranial nerves 3-12 appear to be intact, no obvious focal weakness,gait within normal limits no abnormal reflexes or asymmetrical SKIN: No acute rashes normal turgor, color, no bruising or petechiae. PSYCH: Oriented, good eye contact, no obvious depression anxiety, cognition and judgment appear normal. LN: no cervical axillary inguinal adenopathy  Lab Results  Component Value Date   WBC 6.4 11/04/2015   HGB 14.0 11/04/2015   HCT 41.6 11/04/2015   PLT  258.0 11/04/2015   GLUCOSE 117 (H) 11/04/2015   CHOL 187 11/04/2015   TRIG 154.0 (H) 11/04/2015   HDL 48.70 11/04/2015   LDLDIRECT 163.6 10/23/2006   LDLCALC 108 (H) 11/04/2015   ALT 21 11/04/2015   AST 21 11/04/2015   NA 139 11/04/2015   K 4.2 11/04/2015   CL 104 11/04/2015   CREATININE 0.64 11/04/2015   BUN 21 11/04/2015   CO2 29 11/04/2015   TSH 2.23 11/04/2015   INR 1.0 ratio 11/14/2008   HGBA1C 6.4 11/04/2015   MICROALBUR <0.7 11/03/2014    ASSESSMENT AND PLAN:  Discussed the following assessment and plan:  Visit for preventive health examination  Hyperlipidemia  Prediabetes - better stable   Hypothyroidism, unspecified hypothyroidism type - at goal  Aortic atherosclerosis   Continue  statin med  Am hand stiffness  ? arathritsi not progressive  Other sx better with exercise  Patient Care Team: Burnis Medin, MD as PCP - General Patient Instructions  Intensify lifestyle interventions. Continue same medications. Fu ir hand sx progressive .  ROV  Can do hg a1c at  Visit  Get flu vaccine when available    Mariann Laster K. Kendan Cornforth M.D.

## 2015-11-25 NOTE — Patient Instructions (Addendum)
Intensify lifestyle interventions. Continue same medications. Fu ir hand sx progressive .  ROV  Can do hg a1c at  Visit  Get flu vaccine when available

## 2016-01-04 ENCOUNTER — Encounter: Payer: Self-pay | Admitting: Internal Medicine

## 2016-01-04 ENCOUNTER — Ambulatory Visit (INDEPENDENT_AMBULATORY_CARE_PROVIDER_SITE_OTHER): Payer: 59 | Admitting: Internal Medicine

## 2016-01-04 ENCOUNTER — Telehealth: Payer: Self-pay

## 2016-01-04 VITALS — BP 142/70 | Temp 98.5°F | Wt 125.8 lb

## 2016-01-04 DIAGNOSIS — J029 Acute pharyngitis, unspecified: Secondary | ICD-10-CM

## 2016-01-04 DIAGNOSIS — E119 Type 2 diabetes mellitus without complications: Secondary | ICD-10-CM

## 2016-01-04 DIAGNOSIS — H109 Unspecified conjunctivitis: Secondary | ICD-10-CM

## 2016-01-04 DIAGNOSIS — J069 Acute upper respiratory infection, unspecified: Secondary | ICD-10-CM

## 2016-01-04 LAB — POCT RAPID STREP A (OFFICE): Rapid Strep A Screen: NEGATIVE

## 2016-01-04 MED ORDER — POLYMYXIN B-TRIMETHOPRIM 10000-0.1 UNIT/ML-% OP SOLN: 1.0000 [drp] | OPHTHALMIC | 0 refills | Status: DC

## 2016-01-04 MED ORDER — GLUCOSE BLOOD VI STRP: ORAL_STRIP | 12 refills | Status: DC

## 2016-01-04 MED ORDER — AMOXICILLIN 500 MG PO CAPS: 500.0000 mg | ORAL_CAPSULE | Freq: Three times a day (TID) | ORAL | 0 refills | Status: DC

## 2016-01-04 NOTE — Progress Notes (Deleted)
Pre visit review using our clinic review tool, if applicable. No additional management support is needed unless otherwise documented below in the visit note.  No chief complaint on file.   HPI: Debbie Marshall 70 y.o.  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  Past Medical History:  Diagnosis Date  . ABNORMAL EXAM-BILIARY TRACT 11/14/2008  . ABNORMAL TRANSAMINASE, (LFT'S) 11/14/2008    2005 US shows fatty liver  . Cancer ([place]) 1996   tongue cancer  . Diabetes mellitus without complication ([place])   . FASTING HYPERGLYCEMIA 09/07/2006  . GERD 09/07/2006  . HYPERLIPIDEMIA 09/07/2006  . HYPOTHYROIDISM 10/30/2006  . Nonspecific abnormal results of liver function study 09/07/2006  . Normal nuclear stress test 2005   stress cardiolite   . OSTEOARTHRITIS 09/07/2006  . Positive PPD    [place] vaccine [place]- shows positive after receiving  . 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 VITAMIN D DEFICIENCY 08/14/2009    Family History  Problem Relation Age of Onset  . Stroke Mother 3  . Arthritis Mother   . Rheum arthritis Brother   . Arthritis Brother   . Gout Brother   . Colon cancer Other   . Esophageal cancer Neg Hx   . Stomach cancer Neg Hx   . Rectal cancer Neg Hx     Social History   Social History  . Marital status: Single    Spouse name: N/A  . Number of children: N/A  . Years of education: N/A   Occupational History  . PhD Professor    Social History Main Topics  . Smoking status: Never Smoker  . Smokeless tobacco: Never Used  . Alcohol use 0.6 oz/week    1 Glasses of wine per week  . Drug use: No  . Sexual activity: Not on file   Other Topics Concern  . Not on file   Social History Narrative   HH of 2 Father and her    cat     PhD professor in information systems    No tobacco or alcohol exercising regularly currently        Outpatient Medications Prior to Visit  Medication Sig Dispense Refill  . albuterol (PROAIR HFA) 108 (90 Base) MCG/ACT inhaler Inhale 2 puffs into the lungs every 6 (six) hours as needed for wheezing or shortness of breath. 1 Inhaler 1  . Cholecalciferol 1000 UNITS capsule Take 1 capsule (1,000 Units total) by mouth daily. 90 capsule 3  . glucose blood (ACCU-CHEK AVIVA PLUS) test strip Test twice daily. 100 each 12  . Lancet Devices (ACCU-CHEK SOFTCLIX) lancets Test twice daily. 1 each 12  . metFORMIN (GLUCOPHAGE-XR) 500 MG 24 hr tablet TAKE 2 TABLETS BY MOUTH DAILY. 180 tablet 0  . Omega-3 Fatty Acids (FISH OIL) 1000 MG CAPS Take by mouth.      . simvastatin (ZOCOR) 40 MG tablet TAKE 1 TABLET BY MOUTH ATBEDTIME 90 tablet 3  . SYNTHROID 75 MCG tablet TAKE 1 TABLET DAILY 90 tablet 1   No facility-administered medications prior to visit.      EXAM:  There were no vitals taken for this visit.  There is no height or weight on file to calculate BMI.  GENERAL: vitals reviewed and listed above, alert, oriented, appears well hydrated and in no acute distress HEENT: atraumatic, conjunctiva  clear, no obvious  abnormalities on inspection of external nose and ears OP : no lesion edema or exudate  NECK: no obvious masses on inspection palpation  LUNGS: clear to auscultation bilaterally, no wheezes, rales or rhonchi, good air movement CV: HRRR, no clubbing cyanosis or  peripheral edema nl cap refill  MS: moves all extremities without noticeable focal  abnormality PSYCH: pleasant and cooperative, no obvious depression or anxiety  ASSESSMENT AND PLAN:  Discussed the following assessment and plan:  No diagnosis found.  -Patient advised to return or notify health care team  if symptoms worsen ,persist or new concerns arise.  There are no Patient Instructions on file for this visit.   Standley Brooking. Joel Cowin M.D.

## 2016-01-04 NOTE — Patient Instructions (Addendum)
Checking for strep infection of the throat. The rapid test is negative the culture take 2-3 days to come back.   presentation of runny nose with a sore throat and some eye findings this is more likely to be a viral infection although not a typical cold. It is communicable and you may or may not get a fever with it. Throat should get better in another 2 days and then you may have persistent malaise. If the eyes are getting crusty with yellow thick material we can add an antibiotic eyedrop. And cn begin strep therapy waiting for culture   Your lungs are clear today no evidence of pneumonia. It is possible that this infection could progress to a more significant cough before you get better. If however you have wheezing like he did before contact us.

## 2016-01-04 NOTE — Progress Notes (Signed)
Chief Complaint  Patient presents with  . Sore Throat  . Cough  . Nasal Congestion  . Headache  . Watery Eyes    HPI: Debbie Marshall 70 y.o.  sda   Comes in today with about 5 or 6 days of symptoms. Onset with sore throat and then achiness some runny nose and eyes crusted and red using over-the-counter eyedrops some better. Hurts to swallow no documented fever taking over the counters. Cough may be described as minor irritated. No face pain. Doesn't want to get like she had in the spring with a cough that lasted a long time with wheezing. No exposures Throat is still quite painful no documented fever but feels achy. She doesn't wear contacts. She states she used to have problems with right infections when she was a young child. ROS: See pertinent positives and negatives per HPI.  Past Medical History:  Diagnosis Date  . ABNORMAL EXAM-BILIARY TRACT 11/14/2008  . ABNORMAL TRANSAMINASE, (LFT'S) 11/14/2008    2005 US shows fatty liver  . Cancer ([place]) 1996   tongue cancer  . Diabetes mellitus without complication ([place])   . FASTING HYPERGLYCEMIA 09/07/2006  . GERD 09/07/2006  . HYPERLIPIDEMIA 09/07/2006  . HYPOTHYROIDISM 10/30/2006  . Nonspecific abnormal results of liver function study 09/07/2006  . Normal nuclear stress test 2005   stress cardiolite   . OSTEOARTHRITIS 09/07/2006  . Positive PPD    [place] vaccine [place]- shows positive after receiving  . 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 VITAMIN D DEFICIENCY 08/14/2009    Family History  Problem Relation Age of Onset  . Stroke Mother 51  . Arthritis Mother   . Rheum arthritis Brother   . Arthritis Brother   . Gout Brother   . Colon cancer Other   . Esophageal cancer Neg Hx   . Stomach cancer Neg Hx   . Rectal cancer Neg Hx     Social History   Social History  . Marital status: Single    Spouse name: N/A  . Number of children: N/A  . Years of education: N/A   Occupational History    . PhD Professor    Social History Main Topics  . Smoking status: Never Smoker  . Smokeless tobacco: Never Used  . Alcohol use 0.6 oz/week    1 Glasses of wine per week  . Drug use: No  . Sexual activity: Not Asked   Other Topics Concern  . None   Social History Narrative   HH of 2 Father and her    cat     PhD professor in information systems    No tobacco or alcohol exercising regularly currently       Outpatient Medications Prior to Visit  Medication Sig Dispense Refill  . albuterol (PROAIR HFA) 108 (90 Base) MCG/ACT inhaler Inhale 2 puffs into the lungs every 6 (six) hours as needed for wheezing or shortness of breath. 1 Inhaler 1  . Cholecalciferol 1000 UNITS capsule Take 1 capsule (1,000 Units total) by mouth daily. 90 capsule 3  . Lancet Devices (ACCU-CHEK SOFTCLIX) lancets Test twice daily. 1 each 12  . metFORMIN (GLUCOPHAGE-XR) 500 MG 24 hr tablet TAKE 2 TABLETS BY MOUTH DAILY. 180 tablet 0  . Omega-3 Fatty Acids (FISH OIL) 1000 MG CAPS Take by mouth.      . simvastatin (ZOCOR) 40 MG tablet TAKE 1 TABLET BY MOUTH ATBEDTIME 90 tablet 3  .  SYNTHROID 75 MCG tablet TAKE 1 TABLET DAILY 90 tablet 1  . glucose blood (ACCU-CHEK AVIVA PLUS) test strip Test twice daily. 100 each 12   No facility-administered medications prior to visit.      EXAM:  BP (!) 142/70 (BP Location: Right Arm, Patient Position: Sitting, Cuff Size: Normal)   Temp 98.5 F (36.9 C) (Oral)   Wt 125 lb 12.8 oz (57.1 kg)   BMI 26.29 kg/m   Body mass index is 26.29 kg/m.  GENERAL: vitals reviewed and listed above, alert, oriented, appears well hydrated and in no acute distressMildly ill nontoxic with obvious eye irritation +1 red conjunctival injection.no ciliary flush  And nasal congestion. HEENT: atraumatic, conjunctiva  clear, no obvious abnormalities on inspection of external nose and ears TMs intact. OP : Throat+2 redness  +1 -2  tonsils early punctate exudate on the right  NECK: no obvious  masses on inspection shotty before meals tender palpable nodes  LUNGS: clear to auscultation bilaterally, no wheezes, rales or rhonchi, good air movement CV: HRRR, no clubbing cyanosis or  peripheral edema nl cap refill  MS: moves all extremities without noticeable focal  abnormality   ASSESSMENT AND PLAN:  Discussed the following assessment and plan:  Exudative pharyngitis - Plan: POCT rapid strep A, Culture, Group A Strep  Acute upper respiratory infection of multiple sites  Bilateral conjunctivitis - prob assoc with  illness   Type 2 diabetes mellitus without complication, without long-term current use of insulin (HCC) - Plan: glucose blood (ACCU-CHEK AVIVA PLUS) test strip, DISCONTINUED: glucose blood (ACCU-CHEK AVIVA PLUS) test strip Consider adenovirus syndrome rule out strep although less likely with the congestion and eye findings. No evidence of significant pulmonary compromise. Expectant management. Interim treatment options discussed risk-benefit getting full-blown crusting can use antibiotic eye drops can begin on amoxicillin pending strep would also cover for bacterial sinusitis. -Patient advised to return or notify health care team  if symptoms worsen ,persist or new concerns arise.  Patient Instructions  Checking for strep infection of the throat. The rapid test is negative the culture take 2-3 days to come back.   presentation of runny nose with a sore throat and some eye findings this is more likely to be a viral infection although not a typical cold. It is communicable and you may or may not get a fever with it. Throat should get better in another 2 days and then you may have persistent malaise. If the eyes are getting crusty with yellow thick material we can add an antibiotic eyedrop. And cn begin strep therapy waiting for culture   Your lungs are clear today no evidence of pneumonia. It is possible that this infection could progress to a more significant cough before  you get better. If however you have wheezing like he did before contact us.    Standley Brooking. Jowana Thumma M.D.

## 2016-01-04 NOTE — Telephone Encounter (Signed)
FYI. Home advice given:  * Drink plenty of liquids. This is important to prevent dehydation. DRINK PLENTY LIQUIDS: * Cold drinks, popsicles, and milk shakes are especially good. Avoid citrus fruits. * Eat a soft diet. SOFT DIET: NO ANTIBIOTICS: Antibiotics are not helpful for viral sore throats. EXPECTED COURSE: Sore throats with viral illnesses usually last 3 or 4 days. CALL BACK IF: * Sore throat with cold symptoms, and sore throat lasts over 5 days * Fever lasts over 3 days * You become worse  ------------------------------------------------------------------ PLEASE NOTE: All timestamps contained within this report are represented as [place] Standard Time. CONFIDENTIALTY NOTICE: This fax transmission is intended only for the addressee. It contains information that is legally privileged, confidential or otherwise protected from use or disclosure. If you are not the intended recipient, you are strictly prohibited from reviewing, disclosing, copying using or disseminating any of this information or taking any action in reliance on or regarding this information. If you have received this fax in error, please notify us immediately by telephone so that we can arrange for its return to [place]. Phone: [phone], Toll-Free: [phone], Fax: [phone] Page: 1 of 2 Call Id: [id number] [facility] Night - [place] Patient Name: Debbie Marshall Gender: Female DOB: Sep 07, 1945 Age: 70 Y 26 M 19 D Return Phone Number: [phone] (Primary), [phone] (Secondary) Address: City/State/Zip: [place] Client [facility] Night - Client Client Site [facility] - Night Physician Shanon Ace - MD Contact Type Call Who Is Calling Patient / Member / Family / Caregiver Call Type Triage / Clinical Relationship To Patient Self Return Phone Number [phone] (Secondary) Chief Complaint Cold Symptom Reason  for Call Symptomatic / Request for [place] states has a severe cold and is requesting a prescription. PreDisposition Home Care Translation No Nurse Assessment Nurse: Windle Guard, RN, Lesa Date/Time (Eastern Time): 01/02/2016 10:41:14 AM Confirm and document reason for call. If symptomatic, describe symptoms. You must click the next button to save text entered. ---Caller states she has a cold and sore throat Has the patient traveled out of the country within the last 30 days? ---No Does the patient have any new or worsening symptoms? ---Yes Will a triage be completed? ---Yes Related visit to physician within the last 2 weeks? ---No Does the PT have any chronic conditions? (i.e. diabetes, asthma, etc.) ---No Is this a behavioral health or substance abuse call? ---No Guidelines Guideline Title Affirmed Question Affirmed Notes Nurse Date/Time (Eastern Time) Sore Throat [1] Sore throat with cough/cold symptoms AND [2] present < 5 days (all triage questions negative) Conner, RN, Lesa 01/02/2016 10:41:28 AM Disp. Time Eilene Ghazi Time) Disposition Final User 01/02/2016 10:26:26 AM Attempt made - message left Conner, RN, Lesa 01/02/2016 10:44:05 AM Home Care Yes Conner, RN, Lesa PLEASE NOTE: All timestamps contained within this report are represented as [place] Standard Time. CONFIDENTIALTY NOTICE: This fax transmission is intended only for the addressee. It contains information that is legally privileged, confidential or otherwise protected from use or disclosure. If you are not the intended recipient, you are strictly prohibited from reviewing, disclosing, copying using or disseminating any of this information or taking any action in reliance on or regarding this information. If you have received this fax in error, please notify us immediately by telephone so that we can arrange for its return to [place]. Phone: [phone], Toll-Free: [phone], Fax:  [phone] Page: 2 of 2 Call Id: [id number] [place] Understands: Yes Disagree/Comply:  Comply Care Advice Given Per Guideline * Drink plenty of liquids. This is important to prevent dehydation. DRINK PLENTY LIQUIDS: * Cold drinks, popsicles, and milk shakes are especially good. Avoid citrus fruits. * Eat a soft diet. SOFT DIET: NO ANTIBIOTICS: Antibiotics are not helpful for viral sore throats. EXPECTED COURSE: Sore throats with viral illnesses usually last 3 or 4 days. CALL BACK IF: * Sore throat with cold symptoms, and sore throat lasts over 5 days * Fever lasts over 3 days * You become worse CARE ADVICE given per Sore Throat (Adult) guideline. After Care Instructions Given Call Event Type User Date / Time Description Education document email Windle Guard, RN, Lesa 01/02/2016 10:45:38 AM [facility] Now Instructions

## 2016-01-07 LAB — CULTURE, GROUP A STREP

## 2016-01-11 ENCOUNTER — Telehealth: Payer: Self-pay | Admitting: Internal Medicine

## 2016-01-11 NOTE — Telephone Encounter (Signed)
Pt need to know if she should continue to take the amoxicillin please let her know and wanted to know what the lab results are.  Pt would like for you to leave message if you do not get her on the phone.

## 2016-01-11 NOTE — Telephone Encounter (Signed)
Lease see the result note   From last week   PLease  contatact patient with this information . Thanks   WP  Culture, Group A Strep  Order: [id number]  Status:  Final result Visible to patient:  Yes (MyChart) Next appt:  05/27/2016 at 08:30 AM in Family Medicine Lottie Dawson, MD) Dx:  Exudative pharyngitis  Notes Recorded by Vernetta Honey, CMA on 01/07/2016 at 2:00 PM EDT Left vm to return our call. ------  Notes Recorded by Burnis Medin, MD on 01/07/2016 at 8:15 AM EDT Throat culture shows a NON GROUP A strep This is not The typical " strep throat"These bacteria Are a different serotype  But that can also cause throat infection and antibiotic in some cases may help .  I suggest taking the amoxicillin As given  Either way you should get better .   7d ago  Organism ID, Bacteria Rare STREPTOCOCCUS BETA HEMOLYTIC NOT GROUP A   Resulting Agency [facility]  Narrative   Performed at: [facility], [id number]        [place], [place] [id number]    Specimen Collected: 01/04/16 14:45 Last Resulted: 01/07/16 04:59

## 2016-01-12 ENCOUNTER — Other Ambulatory Visit: Payer: Self-pay | Admitting: Internal Medicine

## 2016-01-12 ENCOUNTER — Telehealth: Payer: Self-pay | Admitting: Internal Medicine

## 2016-01-12 NOTE — Telephone Encounter (Signed)
Left a message on home, cell and work for a return call.  See result note.

## 2016-01-12 NOTE — Telephone Encounter (Signed)
Pt is returning misty and would like her to send a message on mychart if she is unavailable

## 2016-01-12 NOTE — Telephone Encounter (Signed)
Spoke to the pt.  She stopped antibiotic last week because she did not hear about culture results.  Currently not symptomatic.  Pt would like to know if she needs to resume medication.  Please advise.    Pt has asked that reply be sent by mychart.

## 2016-01-13 NOTE — Telephone Encounter (Signed)
If she is well she doesn't have to restart the medication.

## 2016-01-13 NOTE — Telephone Encounter (Signed)
Per result notes, interpretation of culture results was provided by provider (Dr. Regis Bill) on Thursday, 01/07/16 at 8:15am. First attempt to contact patient was made by CMA Geraldine Solar, CMA) at 2:00pm that same day, 01/07/16. Upon Misty's return, Tuesday, 01/12/16, 2nd attempt (2 business days) was made and patient was then aware of results.

## 2016-01-14 ENCOUNTER — Encounter: Payer: Self-pay | Admitting: Family Medicine

## 2016-01-14 NOTE — Telephone Encounter (Signed)
Message sent to the pt by MyChart per her request.

## 2016-01-14 NOTE — Telephone Encounter (Signed)
Sent to the pharmacy by e-scribe for 6 months.  Pt has upcoming follow up on 05/27/16.

## 2016-04-13 NOTE — Progress Notes (Signed)
Pre visit review using our clinic review tool, if applicable. No additional management support is needed unless otherwise documented below in the visit note.  Chief Complaint  Patient presents with  . Sore Throat  . Nasal Congestion  . Headache    HPI: Debbie Marshall 71 y.o.   sda  Acute  Onset a week and now worse for 3 days   Allergy meds and now sorethrat   np pian ? Fever.  ? Low grade  Headache.  Not yet coughing  And then cough  Is very congested may have had more blockage on the right side of her nose than the left. She isn't coughing yet but doesn't want to get as bad as it was last spring. Had mild relief with allergy medicine. No chest pain shortness of breath shaking chills. Nothing new does have a cat at home. ROS: See pertinent positives and negatives per HPI. No cp sob  Syncope vomiting   Past Medical History:  Diagnosis Date  . ABNORMAL EXAM-BILIARY TRACT 11/14/2008  . ABNORMAL TRANSAMINASE, (LFT'S) 11/14/2008    2005 US shows fatty liver  . Cancer ([facility]) 1996   tongue cancer  . Diabetes mellitus without complication ([facility])   . FASTING HYPERGLYCEMIA 09/07/2006  . GERD 09/07/2006  . HYPERLIPIDEMIA 09/07/2006  . HYPOTHYROIDISM 10/30/2006  . Nonspecific abnormal results of liver function study 09/07/2006  . Normal nuclear stress test 2005   stress cardiolite   . OSTEOARTHRITIS 09/07/2006  . Positive PPD    received vaccine in home country- shows positive after receiving  . 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 VITAMIN D DEFICIENCY 08/14/2009    Family History  Problem Relation Age of Onset  . Stroke Mother 47  . Arthritis Mother   . Rheum arthritis Brother   . Arthritis Brother   . Gout Brother   . Colon cancer Other   . Esophageal cancer Neg Hx   . Stomach cancer Neg Hx   . Rectal cancer Neg Hx     Social History   Social History  . Marital status: Single    Spouse name: N/A  . Number of children: N/A  . Years of education: N/A    Occupational History  . PhD Professor    Social History Main Topics  . Smoking status: Never Smoker  . Smokeless tobacco: Never Used  . Alcohol use 0.6 oz/week    1 Glasses of wine per week  . Drug use: No  . Sexual activity: Not Asked   Other Topics Concern  . None   Social History Narrative   HH of 2 Father and her    cat     PhD professor in information systems    No tobacco or alcohol exercising regularly currently       Outpatient Medications Prior to Visit  Medication Sig Dispense Refill  . albuterol (PROAIR HFA) 108 (90 Base) MCG/ACT inhaler Inhale 2 puffs into the lungs every 6 (six) hours as needed for wheezing or shortness of breath. 1 Inhaler 1  . Cholecalciferol 1000 UNITS capsule Take 1 capsule (1,000 Units total) by mouth daily. 90 capsule 3  . glucose blood (ACCU-CHEK AVIVA PLUS) test strip Test twice daily. 100 each 12  . Lancet Devices (ACCU-CHEK SOFTCLIX) lancets Test twice daily. 1 each 12  . metFORMIN (GLUCOPHAGE-XR) 500 MG 24 hr tablet TAKE 2 TABLETS BY MOUTH DAILY. 180 tablet 0  . metFORMIN (GLUCOPHAGE-XR) 500  MG 24 hr tablet TAKE 2 TABLETS BY MOUTH DAILY. 180 tablet 1  . Omega-3 Fatty Acids (FISH OIL) 1000 MG CAPS Take by mouth.      . simvastatin (ZOCOR) 40 MG tablet TAKE 1 TABLET BY MOUTH ATBEDTIME 90 tablet 3  . SYNTHROID 75 MCG tablet TAKE 1 TABLET DAILY 90 tablet 1  . amoxicillin (AMOXIL) 500 MG capsule Take 1 capsule (500 mg total) by mouth 3 (three) times daily. 30 capsule 0  . trimethoprim-polymyxin b (POLYTRIM) ophthalmic solution Place 1 drop into both eyes every 4 (four) hours. While awake 10 mL 0   No facility-administered medications prior to visit.      EXAM:  BP 136/76 (BP Location: Right Arm, Patient Position: Sitting, Cuff Size: Normal)   Temp 97.8 F (36.6 C) (Oral)   Ht 4\' 10"  (1.473 m)   Wt 128 lb (58.1 kg)   BMI 26.75 kg/m   Body mass index is 26.75 kg/m. WDWN in NAD  quiet respirations; modreatly  congested  somewhat  hoarse. Non toxic . HEENT: Normocephalic ;atraumatic , Eyes;  PERRL, EOMs  Full, lids and conjunctiva clear,,Ears: no deformities, canals nl, TM landmarks normal, Nose: no deformity or discharge but very congested;right more than left face minimally tender Mouth : OP clear without lesion or edema . Neck: Supple without adenopathy or masses or bruits Chest:  Clear to A&P without wheezes rales or rhonchi CV:  S1-S2 no gallops or murmurs peripheral perfusion is normal Skin :nl perfusion and no acute rashes   Lab Results  Component Value Date   WBC 6.4 11/04/2015   HGB 14.0 11/04/2015   HCT 41.6 11/04/2015   PLT 258.0 11/04/2015   GLUCOSE 117 (H) 11/04/2015   CHOL 187 11/04/2015   TRIG 154.0 (H) 11/04/2015   HDL 48.70 11/04/2015   LDLDIRECT 163.6 10/23/2006   LDLCALC 108 (H) 11/04/2015   ALT 21 11/04/2015   AST 21 11/04/2015   NA 139 11/04/2015   K 4.2 11/04/2015   CL 104 11/04/2015   CREATININE 0.64 11/04/2015   BUN 21 11/04/2015   CO2 29 11/04/2015   TSH 2.23 11/04/2015   INR 1.0 ratio 11/14/2008   HGBA1C 6.4 11/04/2015   MICROALBUR <0.7 11/03/2014    ASSESSMENT AND PLAN:  Discussed the following assessment and plan:  Rhinosinusitis  Acute upper respiratory infection of multiple sites  Type 2 diabetes mellitus without complication, without long-term current use of insulin (HCC) Probably viral sinusitis with secondary underlying allergy at risk for bacterial infection based on past history and scenarios. Prescription given for antibiotic to add if no improvement in severity of congestion in the next 48-72 hours or if worse. Informed that cough can be from multiple reasons and could be a viral illness if it progresses. Fortunately her exam is reassuring today. -Patient advised to return or notify health care team  if symptoms worsen ,persist or new concerns arise.  Patient Instructions  This is probably a viral sinusitis with some underlying allergy nasal  congestion. Continue to use saline nose spray nasal cortisone such as Flonase every day which can decrease swelling from allergy but isn't a quick relief. For the next 2 or 3 days use over-the-counter Afrin nose spray which is a topical decongestant. Do not use it more than 3 days in a row. If this does not relieve the symptoms of severe nasal congestion and improve then add the antibiotic as we discussed. Your chest exam is normal today.    Sinusitis, Adult  Sinusitis is soreness and inflammation of your sinuses. Sinuses are hollow spaces in the bones around your face. Your sinuses are located:  Around your eyes.  In the middle of your forehead.  Behind your nose.  In your cheekbones. Your sinuses and nasal passages are lined with a stringy fluid (mucus). Mucus normally drains out of your sinuses. When your nasal tissues become inflamed or swollen, the mucus can become trapped or blocked so air cannot flow through your sinuses. This allows bacteria, viruses, and funguses to grow, which leads to infection. Sinusitis can develop quickly and last for 7?10 days (acute) or for more than 12 weeks (chronic). Sinusitis often develops after a cold. What are the causes? This condition is caused by anything that creates swelling in the sinuses or stops mucus from draining, including:  Allergies.  Asthma.  Bacterial or viral infection.  Abnormally shaped bones between the nasal passages.  Nasal growths that contain mucus (nasal polyps).  Narrow sinus openings.  Pollutants, such as chemicals or irritants in the air.  A foreign object stuck in the nose.  A fungal infection. This is rare. What increases the risk? The following factors may make you more likely to develop this condition:  Having allergies or asthma.  Having had a recent cold or respiratory tract infection.  Having structural deformities or blockages in your nose or sinuses.  Having a weak immune system.  Doing a lot  of swimming or diving.  Overusing nasal sprays.  Smoking. What are the signs or symptoms? The main symptoms of this condition are pain and a feeling of pressure around the affected sinuses. Other symptoms include:  Upper toothache.  Earache.  Headache.  Bad breath.  Decreased sense of smell and taste.  A cough that may get worse at night.  Fatigue.  Fever.  Thick drainage from your nose. The drainage is often green and it may contain pus (purulent).  Stuffy nose or congestion.  Postnasal drip. This is when extra mucus collects in the throat or back of the nose.  Swelling and warmth over the affected sinuses.  Sore throat.  Sensitivity to light. How is this diagnosed? This condition is diagnosed based on symptoms, a medical history, and a physical exam. To find out if your condition is acute or chronic, your health care provider may:  Look in your nose for signs of nasal polyps.  Tap over the affected sinus to check for signs of infection.  View the inside of your sinuses using an imaging device that has a light attached (endoscope). If your health care provider suspects that you have chronic sinusitis, you may also:  Be tested for allergies.  Have a sample of mucus taken from your nose (nasal culture) and checked for bacteria.  Have a mucus sample examined to see if your sinusitis is related to an allergy. If your sinusitis does not respond to treatment and it lasts longer than 8 weeks, you may have an MRI or CT scan to check your sinuses. These scans also help to determine how severe your infection is. In rare cases, a bone biopsy may be done to rule out more serious types of fungal sinus disease. How is this treated? Treatment for sinusitis depends on the cause and whether your condition is chronic or acute. If a virus is causing your sinusitis, your symptoms will go away on their own within 10 days. You may be given medicines to relieve your symptoms,  including:  Topical nasal decongestants. They shrink  swollen nasal passages and let mucus drain from your sinuses.  Antihistamines. These drugs block inflammation that is triggered by allergies. This can help to ease swelling in your nose and sinuses.  Topical nasal corticosteroids. These are nasal sprays that ease inflammation and swelling in your nose and sinuses.  Nasal saline washes. These rinses can help to get rid of thick mucus in your nose. If your condition is caused by bacteria, you will be given an antibiotic medicine. If your condition is caused by a fungus, you will be given an antifungal medicine. Surgery may be needed to correct underlying conditions, such as narrow nasal passages. Surgery may also be needed to remove polyps. Follow these instructions at home: Medicines  Take, use, or apply over-the-counter and prescription medicines only as told by your health care provider. These may include nasal sprays.  If you were prescribed an antibiotic medicine, take it as told by your health care provider. Do not stop taking the antibiotic even if you start to feel better. Hydrate and Humidify  Drink enough water to keep your urine clear or pale yellow. Staying hydrated will help to thin your mucus.  Use a cool mist humidifier to keep the humidity level in your home above 50%.  Inhale steam for 10-15 minutes, 3-4 times a day or as told by your health care provider. You can do this in the bathroom while a hot shower is running.  Limit your exposure to cool or dry air. Rest  Rest as much as possible.  Sleep with your head raised (elevated).  Make sure to get enough sleep each night. General instructions  Apply a warm, moist washcloth to your face 3-4 times a day or as told by your health care provider. This will help with discomfort.  Wash your hands often with soap and water to reduce your exposure to viruses and other germs. If soap and water are not available, use hand  sanitizer.  Do not smoke. Avoid being around people who are smoking (secondhand smoke).  Keep all follow-up visits as told by your health care provider. This is important. Contact a health care provider if:  You have a fever.  Your symptoms get worse.  Your symptoms do not improve within 10 days. Get help right away if:  You have a severe headache.  You have persistent vomiting.  You have pain or swelling around your face or eyes.  You have vision problems.  You develop confusion.  Your neck is stiff.  You have trouble breathing. This information is not intended to replace advice given to you by your health care provider. Make sure you discuss any questions you have with your health care provider. Document Released: 03/28/2005 Document Revised: 11/22/2015 Document Reviewed: 01/21/2015 Elsevier Interactive Patient Education  2017 [place] K. Parag Dorton M.D.

## 2016-04-14 ENCOUNTER — Encounter: Payer: Self-pay | Admitting: Internal Medicine

## 2016-04-14 ENCOUNTER — Ambulatory Visit (INDEPENDENT_AMBULATORY_CARE_PROVIDER_SITE_OTHER): Payer: 59 | Admitting: Internal Medicine

## 2016-04-14 VITALS — BP 136/76 | Temp 97.8°F | Ht <= 58 in | Wt 128.0 lb

## 2016-04-14 DIAGNOSIS — J329 Chronic sinusitis, unspecified: Secondary | ICD-10-CM | POA: Diagnosis not present

## 2016-04-14 DIAGNOSIS — J069 Acute upper respiratory infection, unspecified: Secondary | ICD-10-CM | POA: Diagnosis not present

## 2016-04-14 DIAGNOSIS — E119 Type 2 diabetes mellitus without complications: Secondary | ICD-10-CM

## 2016-04-14 DIAGNOSIS — J310 Chronic rhinitis: Secondary | ICD-10-CM

## 2016-04-14 MED ORDER — SYNTHROID 75 MCG PO TABS: 75.0000 ug | ORAL_TABLET | Freq: Every day | ORAL | 1 refills | Status: DC

## 2016-04-14 MED ORDER — AMOXICILLIN-POT CLAVULANATE 875-125 MG PO TABS: 1.0000 | ORAL_TABLET | Freq: Two times a day (BID) | ORAL | 0 refills | Status: DC

## 2016-04-14 NOTE — Patient Instructions (Signed)
This is probably a viral sinusitis with some underlying allergy nasal congestion. Continue to use saline nose spray nasal cortisone such as Flonase every day which can decrease swelling from allergy but isn't a quick relief. For the next 2 or 3 days use over-the-counter Afrin nose spray which is a topical decongestant. Do not use it more than 3 days in a row. If this does not relieve the symptoms of severe nasal congestion and improve then add the antibiotic as we discussed. Your chest exam is normal today.    Sinusitis, Adult Sinusitis is soreness and inflammation of your sinuses. Sinuses are hollow spaces in the bones around your face. Your sinuses are located:  Around your eyes.  In the middle of your forehead.  Behind your nose.  In your cheekbones. Your sinuses and nasal passages are lined with a stringy fluid (mucus). Mucus normally drains out of your sinuses. When your nasal tissues become inflamed or swollen, the mucus can become trapped or blocked so air cannot flow through your sinuses. This allows bacteria, viruses, and funguses to grow, which leads to infection. Sinusitis can develop quickly and last for 7?10 days (acute) or for more than 12 weeks (chronic). Sinusitis often develops after a cold. What are the causes? This condition is caused by anything that creates swelling in the sinuses or stops mucus from draining, including:  Allergies.  Asthma.  Bacterial or viral infection.  Abnormally shaped bones between the nasal passages.  Nasal growths that contain mucus (nasal polyps).  Narrow sinus openings.  Pollutants, such as chemicals or irritants in the air.  A foreign object stuck in the nose.  A fungal infection. This is rare. What increases the risk? The following factors may make you more likely to develop this condition:  Having allergies or asthma.  Having had a recent cold or respiratory tract infection.  Having structural deformities or blockages in  your nose or sinuses.  Having a weak immune system.  Doing a lot of swimming or diving.  Overusing nasal sprays.  Smoking. What are the signs or symptoms? The main symptoms of this condition are pain and a feeling of pressure around the affected sinuses. Other symptoms include:  Upper toothache.  Earache.  Headache.  Bad breath.  Decreased sense of smell and taste.  A cough that may get worse at night.  Fatigue.  Fever.  Thick drainage from your nose. The drainage is often green and it may contain pus (purulent).  Stuffy nose or congestion.  Postnasal drip. This is when extra mucus collects in the throat or back of the nose.  Swelling and warmth over the affected sinuses.  Sore throat.  Sensitivity to light. How is this diagnosed? This condition is diagnosed based on symptoms, a medical history, and a physical exam. To find out if your condition is acute or chronic, your health care provider may:  Look in your nose for signs of nasal polyps.  Tap over the affected sinus to check for signs of infection.  View the inside of your sinuses using an imaging device that has a light attached (endoscope). If your health care provider suspects that you have chronic sinusitis, you may also:  Be tested for allergies.  Have a sample of mucus taken from your nose (nasal culture) and checked for bacteria.  Have a mucus sample examined to see if your sinusitis is related to an allergy. If your sinusitis does not respond to treatment and it lasts longer than 8 weeks, you  may have an MRI or CT scan to check your sinuses. These scans also help to determine how severe your infection is. In rare cases, a bone biopsy may be done to rule out more serious types of fungal sinus disease. How is this treated? Treatment for sinusitis depends on the cause and whether your condition is chronic or acute. If a virus is causing your sinusitis, your symptoms will go away on their own within 10  days. You may be given medicines to relieve your symptoms, including:  Topical nasal decongestants. They shrink swollen nasal passages and let mucus drain from your sinuses.  Antihistamines. These drugs block inflammation that is triggered by allergies. This can help to ease swelling in your nose and sinuses.  Topical nasal corticosteroids. These are nasal sprays that ease inflammation and swelling in your nose and sinuses.  Nasal saline washes. These rinses can help to get rid of thick mucus in your nose. If your condition is caused by bacteria, you will be given an antibiotic medicine. If your condition is caused by a fungus, you will be given an antifungal medicine. Surgery may be needed to correct underlying conditions, such as narrow nasal passages. Surgery may also be needed to remove polyps. Follow these instructions at home: Medicines  Take, use, or apply over-the-counter and prescription medicines only as told by your health care provider. These may include nasal sprays.  If you were prescribed an antibiotic medicine, take it as told by your health care provider. Do not stop taking the antibiotic even if you start to feel better. Hydrate and Humidify  Drink enough water to keep your urine clear or pale yellow. Staying hydrated will help to thin your mucus.  Use a cool mist humidifier to keep the humidity level in your home above 50%.  Inhale steam for 10-15 minutes, 3-4 times a day or as told by your health care provider. You can do this in the bathroom while a hot shower is running.  Limit your exposure to cool or dry air. Rest  Rest as much as possible.  Sleep with your head raised (elevated).  Make sure to get enough sleep each night. General instructions  Apply a warm, moist washcloth to your face 3-4 times a day or as told by your health care provider. This will help with discomfort.  Wash your hands often with soap and water to reduce your exposure to viruses and  other germs. If soap and water are not available, use hand sanitizer.  Do not smoke. Avoid being around people who are smoking (secondhand smoke).  Keep all follow-up visits as told by your health care provider. This is important. Contact a health care provider if:  You have a fever.  Your symptoms get worse.  Your symptoms do not improve within 10 days. Get help right away if:  You have a severe headache.  You have persistent vomiting.  You have pain or swelling around your face or eyes.  You have vision problems.  You develop confusion.  Your neck is stiff.  You have trouble breathing. This information is not intended to replace advice given to you by your health care provider. Make sure you discuss any questions you have with your health care provider. Document Released: 03/28/2005 Document Revised: 11/22/2015 Document Reviewed: 01/21/2015 Elsevier Interactive Patient Education  2017 [employer].

## 2016-05-10 DIAGNOSIS — J330 Polyp of nasal cavity: Secondary | ICD-10-CM | POA: Diagnosis not present

## 2016-05-24 ENCOUNTER — Ambulatory Visit
Admission: RE | Admit: 2016-05-24 | Discharge: 2016-05-24 | Disposition: A | Discharge status: Home / Self Care | Payer: 59 | Source: Ambulatory Visit | Attending: Otolaryngology | Admitting: Otolaryngology

## 2016-05-24 ENCOUNTER — Other Ambulatory Visit: Payer: Self-pay | Admitting: Otolaryngology

## 2016-05-24 DIAGNOSIS — J330 Polyp of nasal cavity: Secondary | ICD-10-CM

## 2016-05-24 DIAGNOSIS — J339 Nasal polyp, unspecified: Secondary | ICD-10-CM | POA: Diagnosis not present

## 2016-05-26 NOTE — Progress Notes (Signed)
Pre visit review using our clinic review tool, if applicable. No additional management support is needed unless otherwise documented below in the visit note.  Chief Complaint  Patient presents with  . Follow-up    HPI: Debbie Marshall 71 y.o. come in for Chronic disease management   Since her last visit she has seen the ear nose and throat physician. She's been noted to have a polyp on her right nasal passage and been treated for sinusitis and recently with the steroid.  She recently had a CT scan but doesn't have the results yet. The recommendation are to have the polyp surgically removed.  Hasn't been checking her sugars recently been staying on her medicines. No new symptoms.  Needs flu shot today.  Blood pressure had been good at home surprised it's up today. 1:30 range.   ROS: See pertinent positives and negatives per HPI. No coughing fever numbness major vision change today.  Past Medical History:  Diagnosis Date  . ABNORMAL EXAM-BILIARY TRACT 11/14/2008  . ABNORMAL TRANSAMINASE, (LFT'S) 11/14/2008    2005 US shows fatty liver  . Cancer ([place]) 1996   tongue cancer  . Diabetes mellitus without complication ([place])   . FASTING HYPERGLYCEMIA 09/07/2006  . GERD 09/07/2006  . HYPERLIPIDEMIA 09/07/2006  . HYPOTHYROIDISM 10/30/2006  . Nonspecific abnormal results of liver function study 09/07/2006  . Normal nuclear stress test 2005   stress cardiolite   . OSTEOARTHRITIS 09/07/2006  . Positive PPD    [place] vaccine [place]- shows positive after receiving  . 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 VITAMIN D DEFICIENCY 08/14/2009    Family History  Problem Relation Age of Onset  . Stroke Mother 27  . Arthritis Mother   . Rheum arthritis Brother   . Arthritis Brother   . Gout Brother   . Colon cancer Other   . Esophageal cancer Neg Hx   . Stomach cancer Neg Hx   . Rectal cancer Neg Hx     Social History   Social History  . Marital status:  Single    Spouse name: N/A  . Number of children: N/A  . Years of education: N/A   Occupational History  . PhD Professor    Social History Main Topics  . Smoking status: Never Smoker  . Smokeless tobacco: Never Used  . Alcohol use 0.6 oz/week    1 Glasses of wine per week  . Drug use: No  . Sexual activity: Not Asked   Other Topics Concern  . None   Social History Narrative   HH of 2 Father and her    cat     PhD professor in information systems    No tobacco or alcohol exercising regularly currently       Outpatient Medications Prior to Visit  Medication Sig Dispense Refill  . albuterol (PROAIR HFA) 108 (90 Base) MCG/ACT inhaler Inhale 2 puffs into the lungs every 6 (six) hours as needed for wheezing or shortness of breath. 1 Inhaler 1  . Cholecalciferol 1000 UNITS capsule Take 1 capsule (1,000 Units total) by mouth daily. 90 capsule 3  . glucose blood (ACCU-CHEK AVIVA PLUS) test strip Test twice daily. 100 each 12  . Lancet Devices (ACCU-CHEK SOFTCLIX) lancets Test twice daily. 1 each 12  . metFORMIN (GLUCOPHAGE-XR) 500 MG 24 hr tablet TAKE 2 TABLETS BY MOUTH DAILY. 180 tablet 0  . metFORMIN (GLUCOPHAGE-XR) 500 MG 24 hr tablet TAKE  2 TABLETS BY MOUTH DAILY. 180 tablet 1  . Omega-3 Fatty Acids (FISH OIL) 1000 MG CAPS Take by mouth.      . simvastatin (ZOCOR) 40 MG tablet TAKE 1 TABLET BY MOUTH ATBEDTIME 90 tablet 3  . SYNTHROID 75 MCG tablet Take 1 tablet (75 mcg total) by mouth daily. 90 tablet 1  . amoxicillin-clavulanate (AUGMENTIN) 875-125 MG tablet Take 1 tablet by mouth every 12 (twelve) hours. 14 tablet 0   No facility-administered medications prior to visit.      EXAM:  BP 140/80 (BP Location: Right Arm, Patient Position: Sitting, Cuff Size: Normal)   Temp 98.1 F (36.7 C) (Oral)   Wt 123 lb (55.8 kg)   BMI 25.71 kg/m   Body mass index is 25.71 kg/m. Repeat blood pressure 130/78. GENERAL: vitals reviewed and listed above, alert, oriented, appears well  hydrated and in no acute distress HEENT: atraumatic, conjunctiva  clear, no obvious abnormalities on inspection of external nose and ears OP : no lesion edema or exudate  NECK: no obvious masses on inspection palpation  LUNGS: clear to auscultation bilaterally, no wheezes, rales or rhonchi, good air movement CV: HRRR, no clubbing cyanosis or  peripheral edema nl cap refill  MS: moves all extremities without noticeable focal  abnormality PSYCH: pleasant and cooperative, no obvious depression or anxiety Lab Results  Component Value Date   WBC 6.4 11/04/2015   HGB 14.0 11/04/2015   HCT 41.6 11/04/2015   PLT 258.0 11/04/2015   GLUCOSE 117 (H) 11/04/2015   CHOL 187 11/04/2015   TRIG 154.0 (H) 11/04/2015   HDL 48.70 11/04/2015   LDLDIRECT 163.6 10/23/2006   LDLCALC 108 (H) 11/04/2015   ALT 21 11/04/2015   AST 21 11/04/2015   NA 139 11/04/2015   K 4.2 11/04/2015   CL 104 11/04/2015   CREATININE 0.64 11/04/2015   BUN 21 11/04/2015   CO2 29 11/04/2015   TSH 2.23 11/04/2015   INR 1.0 ratio 11/14/2008   HGBA1C 6.5 05/27/2016   MICROALBUR <0.7 11/03/2014   BP Readings from Last 3 Encounters:  05/27/16 140/80  04/14/16 136/76  01/04/16 (!) 142/70    ASSESSMENT AND PLAN:  Discussed the following assessment and plan:  Type 2 diabetes mellitus without complication, without long-term current use of insulin (HCC) - a1c 6.5 controlled - Plan: POCT A1C  Prediabetes  Need for prophylactic vaccination and inoculation against influenza - Plan: Flu vaccine HIGH DOSE PF (Fluzone High dose)  Elevated BP without diagnosis of hypertension - nl on repeat  rx with ace arb if needed to add on   Right nasal polyps - reported ct should call ent if not contacted about fu  Reviewed above expectant management lifestyle continue with medication get back with [place] of blood pressure elevated [place] about time to get blood pressure monitor correlated. Otherwise are OV in 6 months with CPX. Of labs and  A1c. -Patient advised to return or notify health care team  if  new concerns arise.  Patient Instructions    Bring in  bp monitor and hahave [place] compare withour office readings .  Repeat BP today was 130/78  .  Follow throught with  ENT  About polyps and  Sinusitis .    CPX with labs and hga 1c in 6 months  w urine micro alb ratio   Mariann Laster K. Jelisa [place]inburg M.D.

## 2016-05-27 ENCOUNTER — Ambulatory Visit (INDEPENDENT_AMBULATORY_CARE_PROVIDER_SITE_OTHER): Payer: 59 | Admitting: Internal Medicine

## 2016-05-27 ENCOUNTER — Encounter: Payer: Self-pay | Admitting: Internal Medicine

## 2016-05-27 VITALS — BP 140/80 | Temp 98.1°F | Wt 123.0 lb

## 2016-05-27 DIAGNOSIS — Z23 Encounter for immunization: Secondary | ICD-10-CM

## 2016-05-27 DIAGNOSIS — R7303 Prediabetes: Secondary | ICD-10-CM | POA: Diagnosis not present

## 2016-05-27 DIAGNOSIS — R030 Elevated blood-pressure reading, without diagnosis of hypertension: Secondary | ICD-10-CM

## 2016-05-27 DIAGNOSIS — E119 Type 2 diabetes mellitus without complications: Secondary | ICD-10-CM | POA: Diagnosis not present

## 2016-05-27 DIAGNOSIS — J339 Nasal polyp, unspecified: Secondary | ICD-10-CM

## 2016-05-27 LAB — POCT GLYCOSYLATED HEMOGLOBIN (HGB A1C): HEMOGLOBIN A1C: 6.5

## 2016-05-27 NOTE — Patient Instructions (Addendum)
   Bring in  bp monitor and hahave [place] compare withour office readings .  Repeat BP today was 130/78  .  Follow throught with  ENT  About polyps and  Sinusitis .    CPX with labs and hga 1c in 6 months  w urine micro alb ratio

## 2016-06-07 DIAGNOSIS — J330 Polyp of nasal cavity: Secondary | ICD-10-CM | POA: Diagnosis not present

## 2016-07-21 NOTE — Progress Notes (Signed)
Chief Complaint  Patient presents with  . Back Pain    pain radiates from lower back to the right hip    HPI: Debbie Marshall 71 y.o.  Comes in today because of a most severe episode of pain similar to what she has had before that is been short lived and not as bad but now progressing and radiating. No injury no fever.  Now more sever and  Lower hip  Pain .  Back buttocks and sometimes down the side of her leg to the knee This episode  About a week  .    Usually takes Aleve but hasn't been taking anything for this seems to be positional no vomiting urinary symptoms although sometimes she feels it is her lateral deep abdome Has hx  Of right lower abd   ROS: See pertinent positives and negatives per HPI. No fever chills hematuria bladder dysfunction or bowel dysfunction. Also is under treatment or nasal polyp per Dr. Lucia Gaskins ear nose and throat. Is treated with prednisone and amoxicillin in the past that dramatically improved her symptoms and she thinks they're coming back and asks about refilling these. She has a call into Dr. Pollie Friar office but hasn't had a response yet. She has no fever but increasing congestion and fullness on the right nasal passage and sinus.     Past Medical History:  Diagnosis Date  . ABNORMAL EXAM-BILIARY TRACT 11/14/2008  . ABNORMAL TRANSAMINASE, (LFT'S) 11/14/2008    2005 US shows fatty liver  . Cancer ([place]) 1996   tongue cancer  . Diabetes mellitus without complication ([place])   . FASTING HYPERGLYCEMIA 09/07/2006  . GERD 09/07/2006  . HYPERLIPIDEMIA 09/07/2006  . HYPOTHYROIDISM 10/30/2006  . Nonspecific abnormal results of liver function study 09/07/2006  . Normal nuclear stress test 2005   stress cardiolite   . OSTEOARTHRITIS 09/07/2006  . Positive PPD    [place] vaccine [place]- shows positive after receiving  . 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 VITAMIN D DEFICIENCY 08/14/2009    Family History  Problem Relation Age  of Onset  . Stroke Mother 62  . Arthritis Mother   . Rheum arthritis Brother   . Arthritis Brother   . Gout Brother   . Colon cancer Other   . Esophageal cancer Neg Hx   . Stomach cancer Neg Hx   . Rectal cancer Neg Hx     Social History   Social History  . Marital status: Single    Spouse name: N/A  . Number of children: N/A  . Years of education: N/A   Occupational History  . PhD Professor    Social History Main Topics  . Smoking status: Never Smoker  . Smokeless tobacco: Never Used  . Alcohol use 0.6 oz/week    1 Glasses of wine per week  . Drug use: No  . Sexual activity: Not Asked   Other Topics Concern  . None   Social History Narrative   HH of 2 Father and her    cat     PhD professor in information systems    No tobacco or alcohol exercising regularly currently       Outpatient Medications Prior to Visit  Medication Sig Dispense Refill  . albuterol (PROAIR HFA) 108 (90 Base) MCG/ACT inhaler Inhale 2 puffs into the lungs every 6 (six) hours as needed for wheezing or shortness of breath. 1 Inhaler 1  . Cholecalciferol  1000 UNITS capsule Take 1 capsule (1,000 Units total) by mouth daily. 90 capsule 3  . glucose blood (ACCU-CHEK AVIVA PLUS) test strip Test twice daily. 100 each 12  . Lancet Devices (ACCU-CHEK SOFTCLIX) lancets Test twice daily. 1 each 12  . metFORMIN (GLUCOPHAGE-XR) 500 MG 24 hr tablet TAKE 2 TABLETS BY MOUTH DAILY. 180 tablet 0  . metFORMIN (GLUCOPHAGE-XR) 500 MG 24 hr tablet TAKE 2 TABLETS BY MOUTH DAILY. 180 tablet 1  . Omega-3 Fatty Acids (FISH OIL) 1000 MG CAPS Take by mouth.      . simvastatin (ZOCOR) 40 MG tablet TAKE 1 TABLET BY MOUTH ATBEDTIME 90 tablet 3  . SYNTHROID 75 MCG tablet Take 1 tablet (75 mcg total) by mouth daily. 90 tablet 1   No facility-administered medications prior to visit.      EXAM:  BP (!) 140/96 (BP Location: Left Arm, Patient Position: Sitting, Cuff Size: Normal)   Pulse 70   Temp 98.5 F (36.9 C)  (Oral)   Wt 120 lb 9.6 oz (54.7 kg)   SpO2 97%   BMI 25.21 kg/m   Body mass index is 25.21 kg/m.  GENERAL: vitals reviewed and listed above, alert, oriented, appears well hydrated and in no acute distress HEENT: atraumatic, conjunctiva  clear, no obvious abnormalities on inspection of external nose and earsCongested but well.  NECK: no obvious masses on inspection palpation   CV: HRRR, no clubbing cyanosis or  peripheral edema nl cap refill  Abdomen:  Sof,t normal bowel sounds without hepatosplenomegaly, no guarding rebound or masses no CVA tenderness Points to right lower pelvic area that is very lateral and in the groin area. No point tenderness mid spine but points to the SI area and buttocks area of pain. Range of motion of hip appears to be normal. No obvious inguinal masses. Toe heel walk is normal and no abnormal reflexes noted. MS: moves all extremities without noticeable focal  Abnormality Skin no acute rashes. PSYCH: pleasant and cooperative, no obvious depression or anxiety  ASSESSMENT AND PLAN:  Discussed the following assessment and plan:  Hip pain, acute, right - Plan: DG Lumbar Spine 2-3 Views, DG HIP UNILAT W OR W/O PELVIS 2-3 VIEWS RIGHT, CANCELED: POCT urinalysis dipstick  Acute right-sided low back pain, with sciatica presence unspecified - Plan: DG Lumbar Spine 2-3 Views, DG HIP UNILAT W OR W/O PELVIS 2-3 VIEWS RIGHT, POCT Urinalysis Dipstick (Automated), Urine Microscopic Only, CANCELED: POCT urinalysis dipstick  Right nasal polyps  Rhinosinusitis  Abnormal urinalysis This is a history of recurrent pain that is now more progressed in severe this time. Her first problem with this pain that seemed to be minor and short lived may be a few years ago which is reassuring in some ways. Ask mechanical but get urinalysis that has some blood on dipstick. Will ask for microscopic 2 ascertain I don't really think this is a kidney stone based on history and exam but  consideration of further evaluation of persistent progressive. In regard to her sinuses prefer Dr. Lucia Gaskins handle her treatments but I also understand high co-pays review turns etc. Contact us if needed after talking with Dr. Pollie Friar office. consideration of  Broader spectrum  Or different med if needed   No alarm features today  -Patient advised to return or notify health care team  if symptoms worsen ,persist or new concerns arise.  Patient Instructions   Get x-ray of your back and hip as we discussed. I think your pain is still  mechanical pain. Okay to take Aleve twice a day for a week to 10 days for anti-inflammation and pain relief. If it is ongoing or progressive contact us and I will can sit or getting a referral for sports medicine. It does not appear to be related to an abdominal process based on your exam and history today. Get a urinalysis today. It would be best for Dr. Lucia Gaskins to treat sinuses at they're flaring up   however if you don't get a response contact us for advice and we will help out. With treatment     WE NOW OFFER   [facility] FAST TRACK!!!  SAME DAY Appointments for ACUTE CARE  Such as: Sprains, Injuries, cuts, abrasions, rashes, muscle pain, joint pain, back pain Colds, flu, sore throats, headache, allergies, cough, fever  Ear pain, sinus and eye infections Abdominal pain, nausea, vomiting, diarrhea, upset stomach Animal/insect bites  3 Easy Ways to Schedule: Walk-In Scheduling Call in scheduling Mychart Sign-up: [email or web address]            Standley Brooking. Orrie Schubert M.D.

## 2016-07-22 ENCOUNTER — Ambulatory Visit (INDEPENDENT_AMBULATORY_CARE_PROVIDER_SITE_OTHER): Payer: 59 | Admitting: Internal Medicine

## 2016-07-22 ENCOUNTER — Encounter: Payer: Self-pay | Admitting: Internal Medicine

## 2016-07-22 ENCOUNTER — Ambulatory Visit (INDEPENDENT_AMBULATORY_CARE_PROVIDER_SITE_OTHER)
Admission: RE | Admit: 2016-07-22 | Discharge: 2016-07-22 | Disposition: A | Discharge status: Home / Self Care | Payer: 59 | Source: Ambulatory Visit | Attending: Internal Medicine | Admitting: Internal Medicine

## 2016-07-22 VITALS — BP 140/96 | HR 70 | Temp 98.5°F | Wt 120.6 lb

## 2016-07-22 DIAGNOSIS — M25551 Pain in right hip: Secondary | ICD-10-CM

## 2016-07-22 DIAGNOSIS — R8290 Unspecified abnormal findings in urine: Secondary | ICD-10-CM

## 2016-07-22 DIAGNOSIS — J329 Chronic sinusitis, unspecified: Secondary | ICD-10-CM | POA: Diagnosis not present

## 2016-07-22 DIAGNOSIS — M545 Low back pain: Secondary | ICD-10-CM | POA: Diagnosis not present

## 2016-07-22 DIAGNOSIS — J339 Nasal polyp, unspecified: Secondary | ICD-10-CM | POA: Diagnosis not present

## 2016-07-22 LAB — POC URINALSYSI DIPSTICK (AUTOMATED)
Bilirubin, UA: NEGATIVE
Blood, UA: POSITIVE
GLUCOSE UA: NEGATIVE
Ketones, UA: NEGATIVE
Leukocytes, UA: NEGATIVE
NITRITE UA: NEGATIVE
Protein, UA: NEGATIVE
SPEC GRAV UA: 1.025 (ref 1.010–1.025)
UROBILINOGEN UA: 0.2 U/dL
pH, UA: 6 (ref 5.0–8.0)

## 2016-07-22 LAB — URINALYSIS, MICROSCOPIC ONLY

## 2016-07-22 NOTE — Patient Instructions (Addendum)
  Get x-ray of your back and hip as we discussed. I think your pain is still  mechanical pain. Okay to take Aleve twice a day for a week to 10 days for anti-inflammation and pain relief. If it is ongoing or progressive contact us and I will can sit or getting a referral for sports medicine. It does not appear to be related to an abdominal process based on your exam and history today. Get a urinalysis today. It would be best for Dr. Lucia Gaskins to treat sinuses at they're flaring up   however if you don't get a response contact us for advice and we will help out. With treatment     WE NOW OFFER   [facility] FAST TRACK!!!  SAME DAY Appointments for ACUTE CARE  Such as: Sprains, Injuries, cuts, abrasions, rashes, muscle pain, joint pain, back pain Colds, flu, sore throats, headache, allergies, cough, fever  Ear pain, sinus and eye infections Abdominal pain, nausea, vomiting, diarrhea, upset stomach Animal/insect bites  3 Easy Ways to Schedule: Walk-In Scheduling Call in scheduling Mychart Sign-up: [email or web address]

## 2016-07-23 IMAGING — MG MM SCREEN MAMMOGRAM BILATERAL
4 series · 4 of 4 positions shown · non-contrast
Comparison: Previous exam(s).

CLINICAL DATA: Screening.

EXAM:
DIGITAL SCREENING BILATERAL MAMMOGRAM WITH CAD

[R CC]
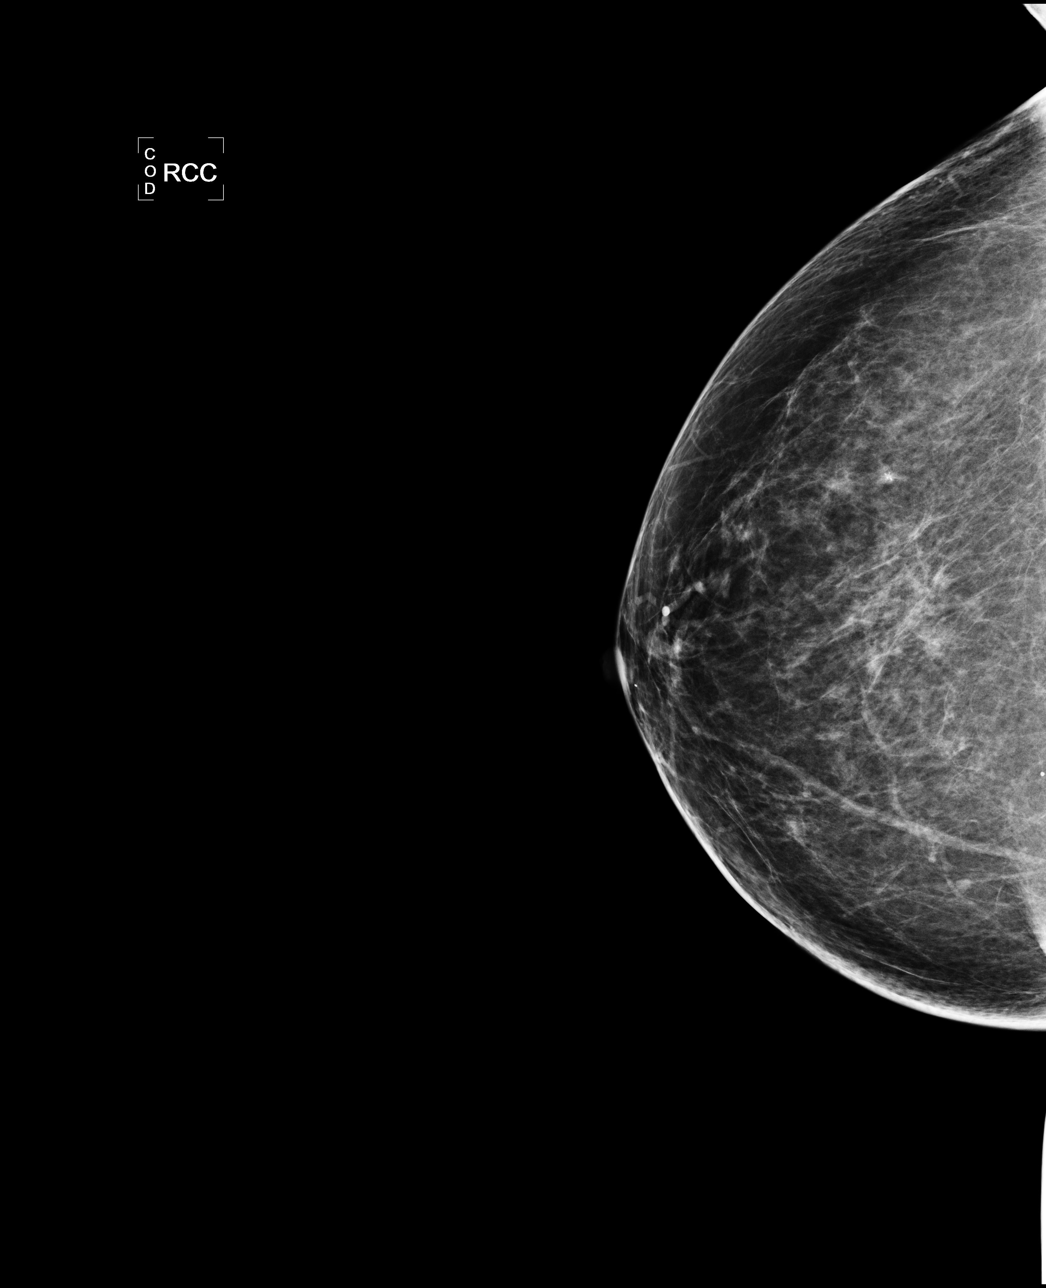

[L CC]
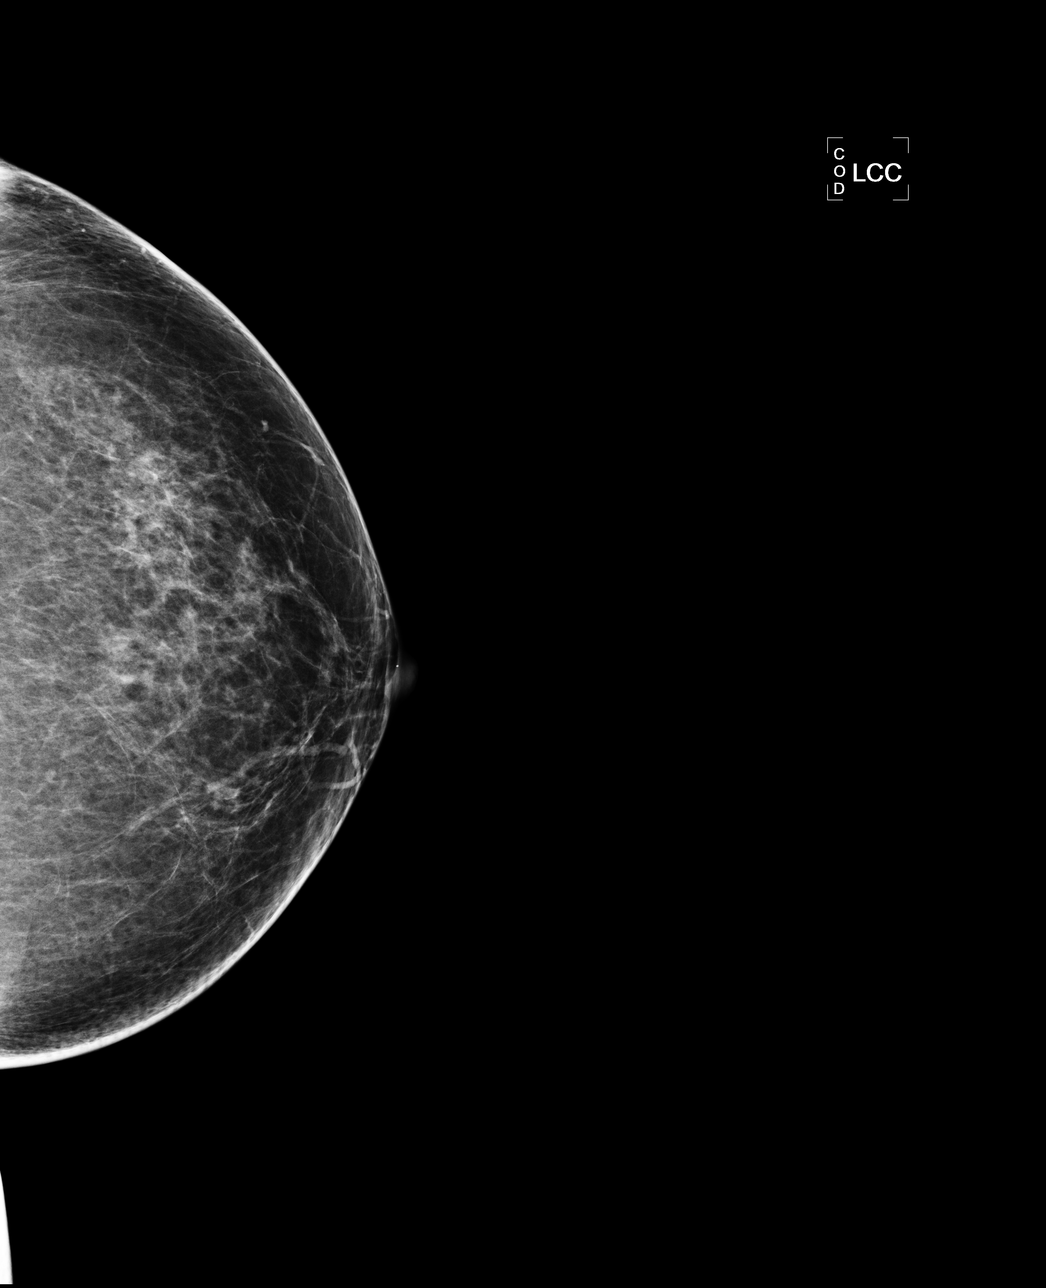

[L MLO]
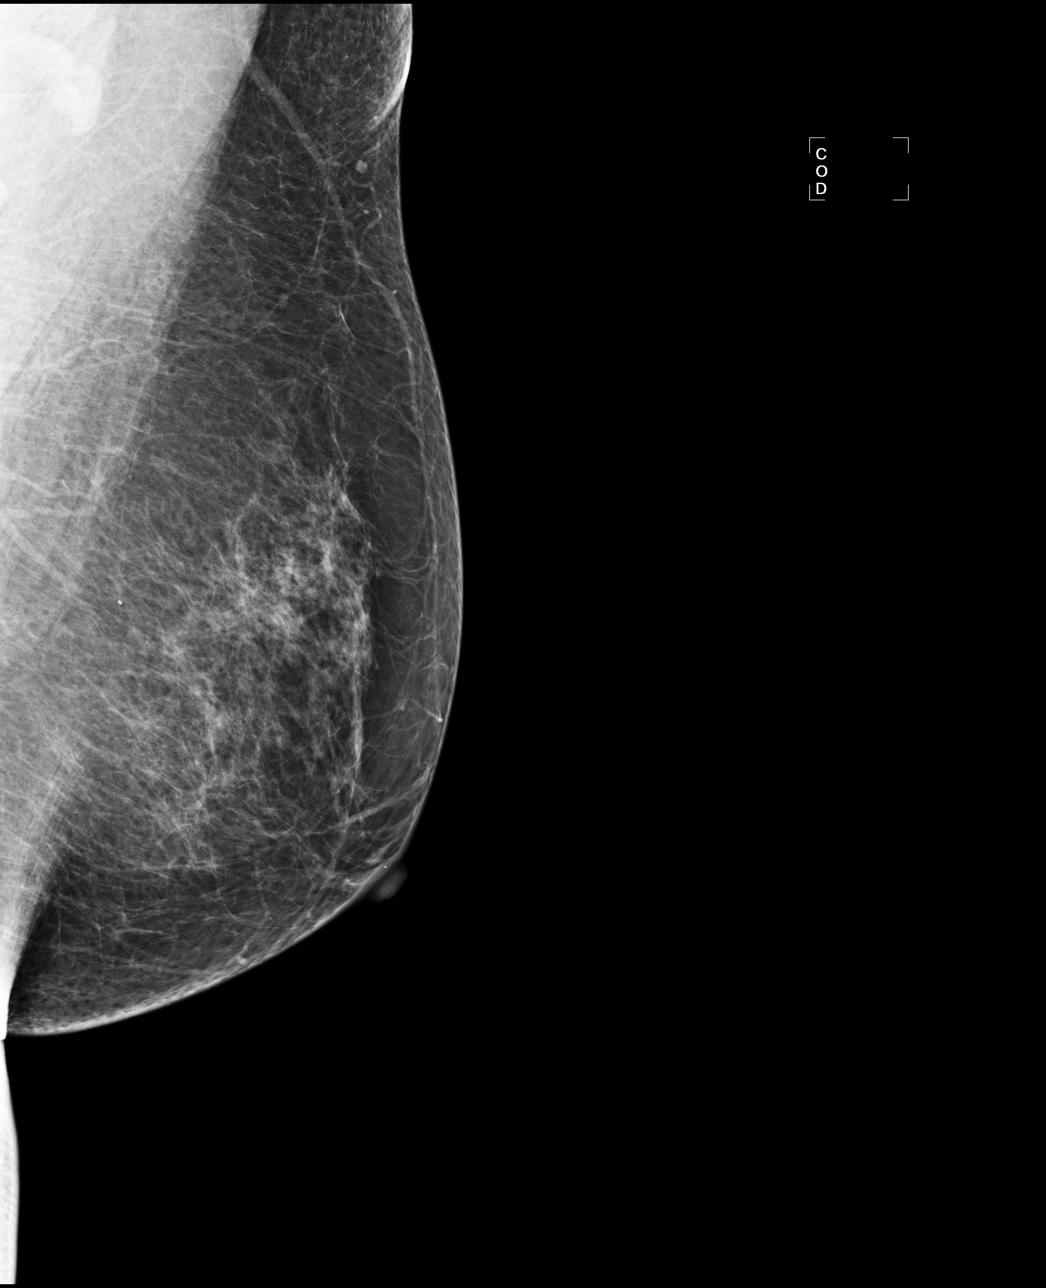

[R MLO]
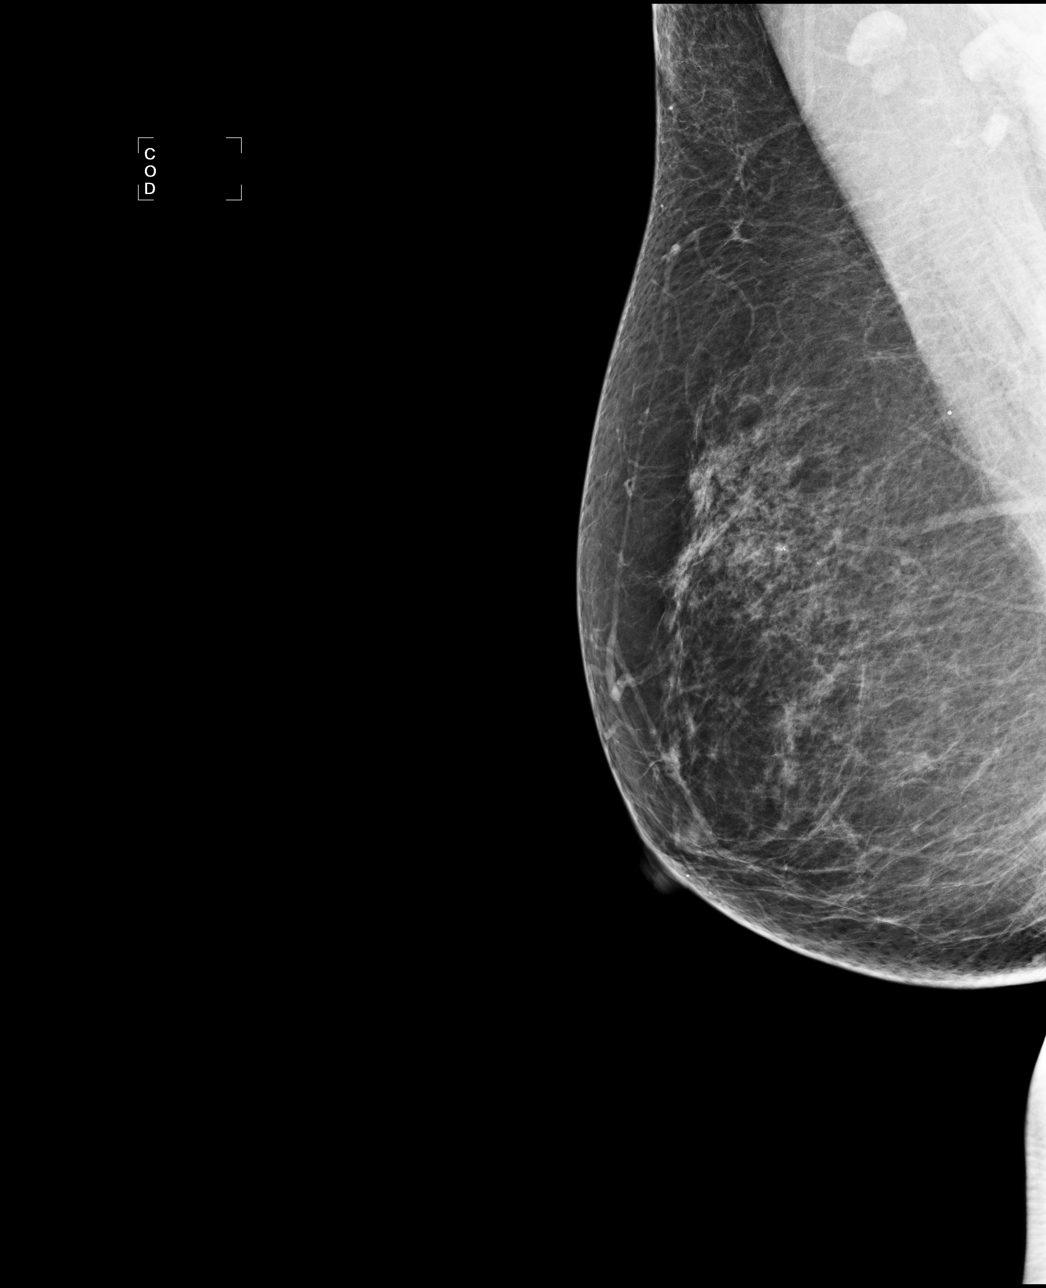

[4 of 4 positions shown; findings below may reference images not displayed]

ACR Breast Density Category b: There are scattered areas of
fibroglandular density.
FINDINGS: There are no findings suspicious for malignancy. Images were
processed with CAD.
IMPRESSION: No mammographic evidence of malignancy. A result letter of this
screening mammogram will be mailed directly to the patient.

RECOMMENDATION:
Screening mammogram in one year. (Code:AS-G-LCT)

BI-RADS CATEGORY  1: Negative.

## 2016-09-06 ENCOUNTER — Other Ambulatory Visit: Payer: Self-pay | Admitting: Internal Medicine

## 2016-11-03 ENCOUNTER — Other Ambulatory Visit: Payer: Self-pay | Admitting: Internal Medicine

## 2016-12-05 NOTE — Progress Notes (Signed)
Chief Complaint  Patient presents with  . Annual Exam    HPI: Patient  Debbie Marshall  71 y.o. comes in today for Preventive Health Care visit  And Chronic disease management    Hd to do antibiotic  Med    Still has some congestion .  And when cough .  Took antibiotic   Some better .  wsa in [place] for a efw months  No problems  Has a cat of 3-4 years  Some sneezing at times  Taking zyrtec   ? If bp up some  Ok at gyme 120 range  And okl but hasnt correlated bp machine with our readings  Tend to be high in our office .   Some stiffness .  In am but no  Other se meds etc  No neuro sx  No sob with exercise   Health Maintenance  Topic Date Due  . FOOT EXAM  11/03/2015  . URINE MICROALBUMIN  11/03/2015  . OPHTHALMOLOGY EXAM  11/19/2015  . MAMMOGRAM  11/09/2016  . INFLUENZA VACCINE  11/09/2016  . HEMOGLOBIN A1C  11/24/2016  . COLONOSCOPY  01/12/2018  . TETANUS/TDAP  06/13/2023  . DEXA SCAN  Completed  . Hepatitis C Screening  Completed  . 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 GI /GU: No adominal pain, vomiting, change in bowel habits. No blood in the stool. No significant GU symptoms. SKIN/HEME: ,no acute skin rashes suspicious lesions or bleeding. No lymphadenopathy, nodules, masses.  NEURO/ PSYCH:  No neurologic signs such as weakness numbness. No depression anxiety. IMM/ Allergy: No unusual infections.  Allergy .   REST of 12 system review negative except as per HPI   Past Medical History:  Diagnosis Date  .  ABNORMAL EXAM-BILIARY TRACT 11/14/2008  . ABNORMAL TRANSAMINASE, (LFT'S) 11/14/2008    2005 US shows fatty liver  . Cancer ([place]) 1996   tongue cancer  . Diabetes mellitus without complication ([place])   . FASTING HYPERGLYCEMIA 09/07/2006  . GERD 09/07/2006  . HYPERLIPIDEMIA 09/07/2006  . HYPOTHYROIDISM 10/30/2006  . Nonspecific abnormal results of liver function study 09/07/2006  . Normal nuclear stress test 2005   stress cardiolite   . OSTEOARTHRITIS 09/07/2006  . Positive PPD    received vaccine in home country- shows positive after receiving  . 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 VITAMIN D DEFICIENCY 08/14/2009    Past Surgical History:  Procedure Laterality Date  . COLONOSCOPY    . [place]   Tumor removed on back of tongue and salivary gland    Family History  Problem Relation Age of Onset  . Stroke Mother 71  . Arthritis Mother   . Rheum arthritis Brother   . Arthritis Brother   . Gout Brother   . Colon cancer Other   . Esophageal cancer Neg Hx   .  Stomach cancer Neg Hx   . Rectal cancer Neg Hx     Social History   Social History  . Marital status: Single    Spouse name: N/A  . Number of children: N/A  . Years of education: N/A   Occupational History  . PhD Professor    Social History Main Topics  . Smoking status: Never Smoker  . Smokeless tobacco: Never Used  . Alcohol use 0.6 oz/week    1 Glasses of wine per week     Comment: rarely  . Drug use: No  . Sexual activity: Not Asked   Other Topics Concern  . None   Social History Narrative   HH of 2 Father and her    cat     PhD professor in information systems    No tobacco or alcohol exercising regularly currently       Outpatient Medications Prior to Visit  Medication Sig Dispense Refill  . albuterol (PROAIR HFA) 108 (90 Base) MCG/ACT inhaler Inhale 2 puffs into the lungs every 6 (six) hours as needed for wheezing or shortness of breath. 1 Inhaler 1   . Cholecalciferol 1000 UNITS capsule Take 1 capsule (1,000 Units total) by mouth daily. 90 capsule 3  . glucose blood (ACCU-CHEK AVIVA PLUS) test strip Test twice daily. 100 each 12  . Lancet Devices (ACCU-CHEK SOFTCLIX) lancets Test twice daily. 1 each 12  . metFORMIN (GLUCOPHAGE-XR) 500 MG 24 hr tablet TAKE 2 TABLETS BY MOUTH DAILY. 180 tablet 1  . Omega-3 Fatty Acids (FISH OIL) 1000 MG CAPS Take by mouth.      . simvastatin (ZOCOR) 40 MG tablet TAKE 1 TABLET BY MOUTH ATBEDTIME 90 tablet 3  . SYNTHROID 75 MCG tablet TAKE 1 TABLET DAILY 90 tablet 0  . metFORMIN (GLUCOPHAGE-XR) 500 MG 24 hr tablet TAKE 2 TABLETS BY MOUTH DAILY. 180 tablet 0  . metFORMIN (GLUCOPHAGE-XR) 500 MG 24 hr tablet TAKE 2 TABLETS BY MOUTH DAILY. 180 tablet 0   No facility-administered medications prior to visit.      EXAM:  BP 140/80 (BP Location: Left Arm, Patient Position: Sitting, Cuff Size: Normal)   Pulse 70   Temp 97.9 F (36.6 C) (Oral)   Ht 4' 8"  (1.422 m)   Wt 121 lb 8 oz (55.1 kg)   BMI 27.24 kg/m   Body mass index is 27.24 kg/m. Wt Readings from Last 3 Encounters:  12/06/16 121 lb 8 oz (55.1 kg)  07/22/16 120 lb 9.6 oz (54.7 kg)  05/27/16 123 lb (55.8 kg)    Physical Exam: Vital signs reviewed [id number] is a well-developed well-nourished alert cooperative    who appearsr stated age in no acute distress.  Mild ur congestion  HEENT: normocephalic atraumatic , Eyes: PERRL EOM's full, conjunctiva clear, Nares: paten,t no deformity discharge or tenderness congested no tenderness ., Ears: no deformity EAC's clear TMs with normal landmarks. Mouth: clear OP, no lesions, edema.  Moist mucous membranes. Dentition in adequate repair. NECK: supple without masses, thyromegaly or bruits. CHEST/PULM:  Clear to auscultation and percussion breath sounds equal no wheeze , rales or rhonchi. No chest wall deformities or tenderness. Breast: normal by inspection . No dimpling, discharge, masses, tenderness or  discharge . CV: PMI is nondisplaced, S1 S2 no gallops, murmurs, rubs. Peripheral pulses are full without delay.No JVD .  ABDOMEN: Bowel sounds normal nontender  No guard or rebound, no hepato splenomegal no CVA tenderness.  No hernia. Extremtities:  No clubbing cyanosis or edema,  no acute joint swelling or redness no focal atrophy NEURO:  Oriented x3, cranial nerves 3-12 appear to be intact, no obvious focal weakness,gait within normal limits no abnormal reflexes or asymmetrical SKIN: No acute rashes normal turgor, color, no bruising or petechiae. PSYCH: Oriented, good eye contact, no obvious depression anxiety, cognition and judgment appear normal. LN: no cervical axillary inguinal adenopathy  Diabetic Foot Exam - Simple   Simple Foot Form Diabetic Foot exam was performed with the following findings:  Yes 12/06/2016  9:54 AM  Visual Inspection No deformities, no ulcerations, no other skin breakdown bilaterally:  Yes Sensation Testing Intact to touch and monofilament testing bilaterally:  Yes Pulse Check Posterior Tibialis and Dorsalis pulse intact bilaterally:  Yes Comments     Lab Results  Component Value Date   WBC 6.4 11/04/2015   HGB 14.0 11/04/2015   HCT 41.6 11/04/2015   PLT 258.0 11/04/2015   GLUCOSE 117 (H) 11/04/2015   CHOL 187 11/04/2015   TRIG 154.0 (H) 11/04/2015   HDL 48.70 11/04/2015   LDLDIRECT 163.6 10/23/2006   LDLCALC 108 (H) 11/04/2015   ALT 21 11/04/2015   AST 21 11/04/2015   NA 139 11/04/2015   K 4.2 11/04/2015   CL 104 11/04/2015   CREATININE 0.64 11/04/2015   BUN 21 11/04/2015   CO2 29 11/04/2015   TSH 2.23 11/04/2015   INR 1.0 ratio 11/14/2008   HGBA1C 6.5 05/27/2016   MICROALBUR <0.7 11/03/2014    BP Readings from Last 3 Encounters:  12/06/16 140/80  07/22/16 (!) 140/96  05/27/16 140/80   Wt Readings from Last 3 Encounters:  12/06/16 121 lb 8 oz (55.1 kg)  07/22/16 120 lb 9.6 oz (54.7 kg)  05/27/16 123 lb (55.8 kg)    Lab results  reviewed with patient   ASSESSMENT AND PLAN:  Discussed the following assessment and plan:  Visit for preventive health examination - Plan: CBC with Differential/Platelet, Comprehensive metabolic panel, Hemoglobin A1c, TSH, Lipid panel, Microalbumin / creatinine urine ratio, CANCELED: Basic metabolic panel, CANCELED: CBC with Differential/Platelet, CANCELED: Hemoglobin A1c, CANCELED: Hepatic function panel, CANCELED: Lipid panel, CANCELED: TSH, CANCELED: Microalbumin / creatinine urine ratio  Type 2 diabetes mellitus without complication, without long-term current use of insulin ([place]) - Plan: CBC with Differential/Platelet, Comprehensive metabolic panel, Hemoglobin A1c, TSH, Lipid panel, Microalbumin / creatinine urine ratio, CANCELED: Basic metabolic panel, CANCELED: CBC with Differential/Platelet, CANCELED: Hemoglobin A1c, CANCELED: Hepatic function panel, CANCELED: Lipid panel, CANCELED: TSH, CANCELED: Microalbumin / creatinine urine ratio  Medication management - Plan: CBC with Differential/Platelet, Comprehensive metabolic panel, Hemoglobin A1c, TSH, Lipid panel, Microalbumin / creatinine urine ratio, CANCELED: Basic metabolic panel, CANCELED: CBC with Differential/Platelet, CANCELED: Hemoglobin A1c, CANCELED: Hepatic function panel, CANCELED: Lipid panel, CANCELED: TSH, CANCELED: Microalbumin / creatinine urine ratio  Hyperlipidemia, unspecified hyperlipidemia type - Plan: CBC with Differential/Platelet, Comprehensive metabolic panel, Hemoglobin A1c, TSH, Lipid panel, Microalbumin / creatinine urine ratio, CANCELED: Basic metabolic panel, CANCELED: CBC with Differential/Platelet, CANCELED: Hemoglobin A1c, CANCELED: Hepatic function panel, CANCELED: Lipid panel, CANCELED: TSH, CANCELED: Microalbumin / creatinine urine ratio  Hypothyroidism, unspecified type - Plan: CBC with Differential/Platelet, Comprehensive metabolic panel, Hemoglobin A1c, TSH, Lipid panel, Microalbumin / creatinine urine  ratio, CANCELED: Basic metabolic panel, CANCELED: CBC with Differential/Platelet, CANCELED: Hemoglobin A1c, CANCELED: Hepatic function panel, CANCELED: Lipid panel, CANCELED: TSH, CANCELED: Microalbumin / creatinine urine ratio  Elevated BP without diagnosis of hypertension  Cough with congestion of paranasal sinus Try INCS for congestion  In add it to zyrtec poss of allergy  to c at  [facility] about bp readings and  And vet the her machine  Goals discussed. consider meds if needed  But may just be a WC response .   Patient Care Team: Burnis Medin, MD as PCP - General Patient Instructions   Chest exam is normal today is possible your congestion is from postnasal drainage and/or allergy. Continue your allergy medicine and add a nasal cortisone such as Flonase or Nasacort every day. If you're getting feverspersisting coughing or chest wheezing let us know for reevaluation. Get more information about blood pressure readings and advisability of other interventions Bring in  Monitor for correlation with office monitor. Contact us for nursing  Time.  Take blood pressure readings twice a day for 7- 10 days and then periodically .To ensure below 140/90   And  Goal is 120/80  Range .   Marland Kitchen Get your eye exam and your mammogram. We'll follow-up depending on lab results or 6 monthsFlu vaccine today Can get new shingles vaccine and shingrix  as per your insurance coverage.  Get fasting lab appt  Orders have been placed   Preventive Care 65 Years and Older, Female Preventive care refers to lifestyle choices and visits with your health care provider that can promote health and wellness. What does preventive care include?  A yearly physical exam. This is also called an annual well check.  Dental exams once or twice a year.  Routine eye exams. Ask your health care provider how often you should have your eyes checked.  Personal lifestyle choices, including: ? Daily care of your teeth and gums. ? Regular  physical activity. ? Eating a healthy diet. ? Avoiding tobacco and drug use. ? Limiting alcohol use. ? Practicing safe sex. ? Taking low-dose aspirin every day. ? Taking vitamin and mineral supplements as recommended by your health care provider. What happens during an annual well check? The services and screenings done by your health care provider during your annual well check will depend on your age, overall health, lifestyle risk factors, and family history of disease. Counseling Your health care provider may ask you questions about your:  Alcohol use.  Tobacco use.  Drug use.  Emotional well-being.  Home and relationship well-being.  Sexual activity.  Eating habits.  History of falls.  Memory and ability to understand (cognition).  Work and work Statistician.  Reproductive health.  Screening You may have the following tests or measurements:  Height, weight, and BMI.  Blood pressure.  Lipid and cholesterol levels. These may be checked every 5 years, or more frequently if you are over 59 years old.  Skin check.  Lung cancer screening. You may have this screening every year starting at age 28 if you have a 30-pack-year history of smoking and currently smoke or have quit within the past 15 years.  Fecal occult blood test (FOBT) of the stool. You may have this test every year starting at age 36.  Flexible sigmoidoscopy or colonoscopy. You may have a sigmoidoscopy every 5 years or a colonoscopy every 10 years starting at age 36.  Hepatitis C blood test.  Hepatitis B blood test.  Sexually transmitted disease (STD) testing.  Diabetes screening. This is done by checking your blood sugar (glucose) after you have not eaten for a while (fasting). You may have this done every 1-3 years.  Bone density scan. This is done to screen for osteoporosis. You may have this done starting at age 28.  Mammogram. This may be done  every 1-2 years. Talk to your health care  provider about how often you should have regular mammograms.  Talk with your health care provider about your test results, treatment options, and if necessary, the need for more tests. Vaccines Your health care provider may recommend certain vaccines, such as:  Influenza vaccine. This is recommended every year.  Tetanus, diphtheria, and acellular pertussis (Tdap, Td) vaccine. You may need a Td booster every 10 years.  Varicella vaccine. You may need this if you have not been vaccinated.  Zoster vaccine. You may need this after age 22.  Measles, mumps, and rubella (MMR) vaccine. You may need at least one dose of MMR if you were born in 1957 or later. You may also need a second dose.  Pneumococcal 13-valent conjugate (PCV13) vaccine. One dose is recommended after age 32.  Pneumococcal polysaccharide (PPSV23) vaccine. One dose is recommended after age 29.  Meningococcal vaccine. You may need this if you have certain conditions.  Hepatitis A vaccine. You may need this if you have certain conditions or if you travel or work in places where you may be exposed to hepatitis A.  Hepatitis B vaccine. You may need this if you have certain conditions or if you travel or work in places where you may be exposed to hepatitis B.  Haemophilus influenzae type b (Hib) vaccine. You may need this if you have certain conditions.  Talk to your health care provider about which screenings and vaccines you need and how often you need them. This information is not intended to replace advice given to you by your health care provider. Make sure you discuss any questions you have with your health care provider. Document Released: 04/24/2015 Document Revised: 12/16/2015 Document Reviewed: 01/27/2015 Elsevier Interactive Patient Education  2017 [place] K. Berley Gambrell M.D.

## 2016-12-06 ENCOUNTER — Encounter: Payer: Self-pay | Admitting: Internal Medicine

## 2016-12-06 ENCOUNTER — Ambulatory Visit (INDEPENDENT_AMBULATORY_CARE_PROVIDER_SITE_OTHER): Payer: 59 | Admitting: Internal Medicine

## 2016-12-06 VITALS — BP 140/80 | HR 70 | Temp 97.9°F | Ht <= 58 in | Wt 121.5 lb

## 2016-12-06 DIAGNOSIS — R058 Other specified cough: Secondary | ICD-10-CM

## 2016-12-06 DIAGNOSIS — Z79899 Other long term (current) drug therapy: Secondary | ICD-10-CM

## 2016-12-06 DIAGNOSIS — Z23 Encounter for immunization: Secondary | ICD-10-CM | POA: Diagnosis not present

## 2016-12-06 DIAGNOSIS — R030 Elevated blood-pressure reading, without diagnosis of hypertension: Secondary | ICD-10-CM | POA: Diagnosis not present

## 2016-12-06 DIAGNOSIS — Z0000 Encounter for general adult medical examination without abnormal findings: Secondary | ICD-10-CM

## 2016-12-06 DIAGNOSIS — E119 Type 2 diabetes mellitus without complications: Secondary | ICD-10-CM | POA: Diagnosis not present

## 2016-12-06 DIAGNOSIS — R0981 Nasal congestion: Secondary | ICD-10-CM

## 2016-12-06 DIAGNOSIS — E785 Hyperlipidemia, unspecified: Secondary | ICD-10-CM

## 2016-12-06 DIAGNOSIS — R05 Cough: Secondary | ICD-10-CM

## 2016-12-06 DIAGNOSIS — E039 Hypothyroidism, unspecified: Secondary | ICD-10-CM

## 2016-12-06 MED ORDER — ZOSTER VAC RECOMB ADJUVANTED 50 MCG/0.5ML IM SUSR: 0.5000 mL | Freq: Once | INTRAMUSCULAR | 1 refills | Status: AC

## 2016-12-06 NOTE — Patient Instructions (Addendum)
Chest exam is normal today is possible your congestion is from postnasal drainage and/or allergy. Continue your allergy medicine and add a nasal cortisone such as Flonase or Nasacort every day. If you're getting feverspersisting coughing or chest wheezing let us know for reevaluation. Get more information about blood pressure readings and advisability of other interventions Bring in  Monitor for correlation with office monitor. Contact us for nursing  Time.  Take blood pressure readings twice a day for 7- 10 days and then periodically .To ensure below 140/90   And  Goal is 120/80  Range .   Marland Kitchen Get your eye exam and your mammogram. We'll follow-up depending on lab results or 6 monthsFlu vaccine today Can get new shingles vaccine and shingrix  as per your insurance coverage.  Get fasting lab appt  Orders have been placed   Preventive Care 69 Years and Older, Female Preventive care refers to lifestyle choices and visits with your health care provider that can promote health and wellness. What does preventive care include?  A yearly physical exam. This is also called an annual well check.  Dental exams once or twice a year.  Routine eye exams. Ask your health care provider how often you should have your eyes checked.  Personal lifestyle choices, including: ? Daily care of your teeth and gums. ? Regular physical activity. ? Eating a healthy diet. ? Avoiding tobacco and drug use. ? Limiting alcohol use. ? Practicing safe sex. ? Taking low-dose aspirin every day. ? Taking vitamin and mineral supplements as recommended by your health care provider. What happens during an annual well check? The services and screenings done by your health care provider during your annual well check will depend on your age, overall health, lifestyle risk factors, and family history of disease. Counseling Your health care provider may ask you questions about your:  Alcohol use.  Tobacco use.  Drug  use.  Emotional well-being.  Home and relationship well-being.  Sexual activity.  Eating habits.  History of falls.  Memory and ability to understand (cognition).  Work and work Statistician.  Reproductive health.  Screening You may have the following tests or measurements:  Height, weight, and BMI.  Blood pressure.  Lipid and cholesterol levels. These may be checked every 5 years, or more frequently if you are over 1 years old.  Skin check.  Lung cancer screening. You may have this screening every year starting at age 73 if you have a 30-pack-year history of smoking and currently smoke or have quit within the past 15 years.  Fecal occult blood test (FOBT) of the stool. You may have this test every year starting at age 35.  Flexible sigmoidoscopy or colonoscopy. You may have a sigmoidoscopy every 5 years or a colonoscopy every 10 years starting at age 71.  Hepatitis C blood test.  Hepatitis B blood test.  Sexually transmitted disease (STD) testing.  Diabetes screening. This is done by checking your blood sugar (glucose) after you have not eaten for a while (fasting). You may have this done every 1-3 years.  Bone density scan. This is done to screen for osteoporosis. You may have this done starting at age 30.  Mammogram. This may be done every 1-2 years. Talk to your health care provider about how often you should have regular mammograms.  Talk with your health care provider about your test results, treatment options, and if necessary, the need for more tests. Vaccines Your health care provider may recommend certain vaccines, such  as:  Influenza vaccine. This is recommended every year.  Tetanus, diphtheria, and acellular pertussis (Tdap, Td) vaccine. You may need a Td booster every 10 years.  Varicella vaccine. You may need this if you have not been vaccinated.  Zoster vaccine. You may need this after age 45.  Measles, mumps, and rubella (MMR) vaccine. You  may need at least one dose of MMR if you were born in 1957 or later. You may also need a second dose.  Pneumococcal 13-valent conjugate (PCV13) vaccine. One dose is recommended after age 37.  Pneumococcal polysaccharide (PPSV23) vaccine. One dose is recommended after age 75.  Meningococcal vaccine. You may need this if you have certain conditions.  Hepatitis A vaccine. You may need this if you have certain conditions or if you travel or work in places where you may be exposed to hepatitis A.  Hepatitis B vaccine. You may need this if you have certain conditions or if you travel or work in places where you may be exposed to hepatitis B.  Haemophilus influenzae type b (Hib) vaccine. You may need this if you have certain conditions.  Talk to your health care provider about which screenings and vaccines you need and how often you need them. This information is not intended to replace advice given to you by your health care provider. Make sure you discuss any questions you have with your health care provider. Document Released: 04/24/2015 Document Revised: 12/16/2015 Document Reviewed: 01/27/2015 Elsevier Interactive Patient Education  2017 [employer].

## 2016-12-06 NOTE — Addendum Note (Signed)
Addended by: Milford Cage on: 12/06/2016 10:20 AM   Modules accepted: Orders

## 2016-12-09 ENCOUNTER — Other Ambulatory Visit (INDEPENDENT_AMBULATORY_CARE_PROVIDER_SITE_OTHER): Payer: 59

## 2016-12-09 DIAGNOSIS — Z79899 Other long term (current) drug therapy: Secondary | ICD-10-CM

## 2016-12-09 DIAGNOSIS — E039 Hypothyroidism, unspecified: Secondary | ICD-10-CM | POA: Diagnosis not present

## 2016-12-09 DIAGNOSIS — E785 Hyperlipidemia, unspecified: Secondary | ICD-10-CM | POA: Diagnosis not present

## 2016-12-09 DIAGNOSIS — E119 Type 2 diabetes mellitus without complications: Secondary | ICD-10-CM | POA: Diagnosis not present

## 2016-12-09 DIAGNOSIS — Z0000 Encounter for general adult medical examination without abnormal findings: Secondary | ICD-10-CM

## 2016-12-09 LAB — COMPREHENSIVE METABOLIC PANEL
ALK PHOS: 52 U/L (ref 39–117)
ALT: 18 U/L (ref 0–35)
AST: 19 U/L (ref 0–37)
Albumin: 4.5 g/dL (ref 3.5–5.2)
BUN: 17 mg/dL (ref 6–23)
CHLORIDE: 105 meq/L (ref 96–112)
CO2: 27 meq/L (ref 19–32)
Calcium: 9.7 mg/dL (ref 8.4–10.5)
Creatinine, Ser: 0.67 mg/dL (ref 0.40–1.20)
GFR: 92.62 mL/min (ref >60.00)
GLUCOSE: 106 mg/dL — AB (ref 70–99)
Potassium: 4 mEq/L (ref 3.5–5.1)
SODIUM: 140 meq/L (ref 135–145)
Total Bilirubin: 0.6 mg/dL (ref 0.2–1.2)
Total Protein: 6.5 g/dL (ref 6.0–8.3)

## 2016-12-09 LAB — CBC WITH DIFFERENTIAL/PLATELET
BASOS PCT: 1 % (ref 0.0–3.0)
Basophils Absolute: 0.1 10*3/uL (ref 0.0–0.1)
EOS PCT: 7.2 % — AB (ref 0.0–5.0)
Eosinophils Absolute: 0.4 10*3/uL (ref 0.0–0.7)
HCT: 42.5 % (ref 36.0–46.0)
Hemoglobin: 14.1 g/dL (ref 12.0–15.0)
LYMPHS ABS: 2 10*3/uL (ref 0.7–4.0)
Lymphocytes Relative: 36.4 % (ref 12.0–46.0)
MCHC: 33.3 g/dL (ref 30.0–36.0)
MCV: 91.6 fl (ref 78.0–100.0)
MONO ABS: 0.3 10*3/uL (ref 0.1–1.0)
MONOS PCT: 6.2 % (ref 3.0–12.0)
NEUTROS ABS: 2.7 10*3/uL (ref 1.4–7.7)
NEUTROS PCT: 49.2 % (ref 43.0–77.0)
PLATELETS: 227 10*3/uL (ref 150.0–400.0)
RBC: 4.64 Mil/uL (ref 3.87–5.11)
RDW: 13.3 % (ref 11.5–15.5)
WBC: 5.5 10*3/uL (ref 4.0–10.5)

## 2016-12-09 LAB — TSH: TSH: 0.56 u[IU]/mL (ref 0.35–4.50)

## 2016-12-09 LAB — LIPID PANEL
CHOL/HDL RATIO: 3
CHOLESTEROL: 126 mg/dL (ref 0–200)
HDL: 49.9 mg/dL (ref >39.00)
LDL CALC: 53 mg/dL (ref 0–99)
NonHDL: 76.05
TRIGLYCERIDES: 115 mg/dL (ref 0.0–149.0)
VLDL: 23 mg/dL (ref 0.0–40.0)

## 2016-12-09 LAB — MICROALBUMIN / CREATININE URINE RATIO
CREATININE, U: 121.3 mg/dL
Microalb Creat Ratio: 0.6 mg/g (ref 0.0–30.0)
Microalb, Ur: <0.7 mg/dL (ref 0.0–1.9)

## 2016-12-09 LAB — HEMOGLOBIN A1C: Hgb A1c MFr Bld: 6.4 % (ref 4.6–6.5)

## 2016-12-16 ENCOUNTER — Other Ambulatory Visit: Payer: Self-pay | Admitting: Emergency Medicine

## 2016-12-16 MED ORDER — SYNTHROID 75 MCG PO TABS: 75.0000 ug | ORAL_TABLET | Freq: Every day | ORAL | 1 refills | Status: DC

## 2016-12-30 ENCOUNTER — Encounter: Payer: Self-pay | Admitting: Internal Medicine

## 2017-02-02 ENCOUNTER — Other Ambulatory Visit: Payer: Self-pay | Admitting: Internal Medicine

## 2017-02-03 NOTE — Telephone Encounter (Signed)
Need for verify pharmacy for refill of Synthroid.  LM x 1 for patient

## 2017-02-06 MED ORDER — SYNTHROID 75 MCG PO TABS: 75.0000 ug | ORAL_TABLET | Freq: Every day | ORAL | 1 refills | Status: DC

## 2017-02-06 NOTE — Telephone Encounter (Signed)
Pt pharm is [facility] on [address]

## 2017-02-06 NOTE — Progress Notes (Signed)
Chief Complaint  Patient presents with  . Cough    x 2 weeks. Pt states that it started as wheezing. P tc/o SOB and chest congestion. Took NyQuil x 1 week - no relief. Denies fever.  Used Albuterol HFA, seemed to help.     HPI: Debbie Marshall 71 y.o.   Acute problem   Onset  Over  2 weeks   .    Wheezing sound  The cough and  Nose  Congestion . Took allergy med   And  Now yellow stuff .  Coughing up from her chest and some from her nose. She feels this is similar in her chest that she had last summer. She has had some sinus pain and pressure but not as bad as in the past. She has some nasal polyps and it was suggested she have surgery for removal but she is reconsidering this because she heard they can come back.  No fever chills hemoptysis.  ROS: See pertinent positives and negatives per HPI.  Blood pressure has been somewhat elevated at home but not like this more in the 140 range.  Has been using Afrin nose spray but not a decongestant. Past Medical History:  Diagnosis Date  . ABNORMAL EXAM-BILIARY TRACT 11/14/2008  . ABNORMAL TRANSAMINASE, (LFT'S) 11/14/2008    2005 US shows fatty liver  . Cancer ([place]) 1996   tongue cancer  . Diabetes mellitus without complication ([place])   . FASTING HYPERGLYCEMIA 09/07/2006  . GERD 09/07/2006  . HYPERLIPIDEMIA 09/07/2006  . HYPOTHYROIDISM 10/30/2006  . Nonspecific abnormal results of liver function study 09/07/2006  . Normal nuclear stress test 2005   stress cardiolite   . OSTEOARTHRITIS 09/07/2006  . Positive PPD    [place] vaccine [place] home country- shows positive after receiving  . 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 VITAMIN D DEFICIENCY 08/14/2009    Family History  Problem Relation Age of Onset  . Stroke Mother 79  . Arthritis Mother   . Rheum arthritis Brother   . Arthritis Brother   . Gout Brother   . Colon cancer Other   . Esophageal cancer Neg Hx   . Stomach cancer Neg Hx   . Rectal cancer Neg Hx      Social History   Social History  . Marital status: Single    Spouse name: N/A  . Number of children: N/A  . Years of education: N/A   Occupational History  . PhD Professor    Social History Main Topics  . Smoking status: Never Smoker  . Smokeless tobacco: Never Used  . Alcohol use 0.6 oz/week    1 Glasses of wine per week     Comment: rarely  . Drug use: No  . Sexual activity: Not Asked   Other Topics Concern  . None   Social History Narrative   HH of 2 Father and her    cat     PhD professor in information systems    No tobacco or alcohol exercising regularly currently       Outpatient Medications Prior to Visit  Medication Sig Dispense Refill  . albuterol (PROAIR HFA) 108 (90 Base) MCG/ACT inhaler Inhale 2 puffs into the lungs every 6 (six) hours as needed for wheezing or shortness of breath. 1 Inhaler 1  . Cholecalciferol 1000 UNITS capsule Take 1 capsule (1,000 Units total) by mouth daily. 90 capsule 3  . glucose blood (ACCU-CHEK AVIVA  PLUS) test strip Test twice daily. 100 each 12  . Lancet Devices (ACCU-CHEK SOFTCLIX) lancets Test twice daily. 1 each 12  . metFORMIN (GLUCOPHAGE-XR) 500 MG 24 hr tablet TAKE 2 TABLETS BY MOUTH DAILY 180 tablet 1  . Omega-3 Fatty Acids (FISH OIL) 1000 MG CAPS Take by mouth.      . simvastatin (ZOCOR) 40 MG tablet TAKE 1 TABLET BY MOUTH AT BEDTIME 90 tablet 1  . SYNTHROID 75 MCG tablet Take 1 tablet (75 mcg total) by mouth daily. 90 tablet 1  . metFORMIN (GLUCOPHAGE-XR) 500 MG 24 hr tablet TAKE 2 TABLETS BY MOUTH DAILY. (Patient not taking: Reported on 02/07/2017) 180 tablet 1   No facility-administered medications prior to visit.      EXAM:  BP (!) 164/78 (BP Location: Right Arm, Patient Position: Sitting, Cuff Size: Normal)   Pulse 64   Temp 98.1 F (36.7 C) (Oral)   Wt 124 lb 12.8 oz (56.6 kg)   SpO2 98%   BMI 27.98 kg/m   Body mass index is 27.98 kg/m.  GENERAL: vitals reviewed and listed above, alert,  oriented, appears well hydrated and in no acute distress nontoxic normal respiratory effort but very congested. HEENT: atraumatic, conjunctiva  clear, no obvious abnormalities on inspection of external nose and ears TMs are clear nares quite congested face minimally tender nares clogged.  OP : no lesion edema or exudate cobblestoning noted. NECK: no obvious masses on inspection palpation  LUNGS: clear to auscultation bilaterally, no wheezes, rales or rhonchi, CV: HRRR, no clubbing cyanosis or  peripheral edema nl cap refill  MS: moves all extremities without noticeable focal  abnormality PSYCH: pleasant and cooperative, no obvious depression or anxiety BP Readings from Last 3 Encounters:  02/07/17 (!) 164/78  12/06/16 140/80  07/22/16 (!) 140/96    ASSESSMENT AND PLAN:  Discussed the following assessment and plan:  Cough, persistent  Wheezy bronchitis - seems to respond to  albuterol   Elevated BP without diagnosis of hypertension - Add losartan if continued elevated and follow-up 2-3 months.  Medication management  Acute recurrent sinusitis, unspecified location Recurrent sinusitis reactive airway possibly History of nasal polyps surgery an option but she is possibly deferring that. Seems to get flareups of these problems in spring and fall. Empiric prednisone antibiotic antibiotic nasal cortisone when improved  Blood pressure monitoring as below add medication if continued not at goal after recovery from this illness. Of note she is under care per Dr. Lucia Gaskins ear nose and throat doctor who saw her in the spring. -Patient advised to return or notify health care team  if symptoms worsen ,persist or new concerns arise.  Patient Instructions  Can add the antibiotic and the prednisone to decrease the swelling in the sinuses and the chest After you finish this you may want to stay on a Flonase type of nasal spray.  That can decrease swelling in the nose related to allergy  possibility.  You can use her albuterol as needed but if not improving over the next week contact us for advice.  If your blood pressure is not coming down to goal below 140/90 but preferred 120/80 after improved begin blood pressure medicine daily as discussed. Then return in 2-3 months for assessment.  Or when planned.    Standley Brooking. Delance Weide M.D.

## 2017-02-06 NOTE — Telephone Encounter (Signed)
Rx sent to [facility]. Nothing further needed.

## 2017-02-07 ENCOUNTER — Ambulatory Visit (INDEPENDENT_AMBULATORY_CARE_PROVIDER_SITE_OTHER): Payer: 59 | Admitting: Internal Medicine

## 2017-02-07 ENCOUNTER — Encounter: Payer: Self-pay | Admitting: Internal Medicine

## 2017-02-07 VITALS — BP 164/78 | HR 64 | Temp 98.1°F | Wt 124.8 lb

## 2017-02-07 DIAGNOSIS — J0191 Acute recurrent sinusitis, unspecified: Secondary | ICD-10-CM | POA: Diagnosis not present

## 2017-02-07 DIAGNOSIS — R05 Cough: Secondary | ICD-10-CM | POA: Diagnosis not present

## 2017-02-07 DIAGNOSIS — R030 Elevated blood-pressure reading, without diagnosis of hypertension: Secondary | ICD-10-CM

## 2017-02-07 DIAGNOSIS — J40 Bronchitis, not specified as acute or chronic: Secondary | ICD-10-CM | POA: Diagnosis not present

## 2017-02-07 DIAGNOSIS — Z79899 Other long term (current) drug therapy: Secondary | ICD-10-CM | POA: Diagnosis not present

## 2017-02-07 DIAGNOSIS — R053 Chronic cough: Secondary | ICD-10-CM

## 2017-02-07 MED ORDER — LOSARTAN POTASSIUM 50 MG PO TABS
50.0000 mg | ORAL_TABLET | Freq: Every day | ORAL | 1 refills | Status: DC
Start: 2017-02-07 — End: 2017-12-23

## 2017-02-07 MED ORDER — DOXYCYCLINE HYCLATE 100 MG PO TABS: 100.0000 mg | ORAL_TABLET | Freq: Two times a day (BID) | ORAL | 0 refills | Status: DC

## 2017-02-07 NOTE — Patient Instructions (Signed)
Can add the antibiotic and the prednisone to decrease the swelling in the sinuses and the chest After you finish this you may want to stay on a Flonase type of nasal spray.  That can decrease swelling in the nose related to allergy possibility.  You can use her albuterol as needed but if not improving over the next week contact us for advice.  If your blood pressure is not coming down to goal below 140/90 but preferred 120/80 after improved begin blood pressure medicine daily as discussed. Then return in 2-3 months for assessment.  Or when planned.

## 2017-02-15 ENCOUNTER — Other Ambulatory Visit: Payer: Self-pay | Admitting: Internal Medicine

## 2017-04-03 NOTE — Progress Notes (Deleted)
No chief complaint on file.   HPI: Debbie Marshall 71 y.o. come in for Chronic disease management   Cough  BP meds   ROS: See pertinent positives and negatives per HPI.  Past Medical History:  Diagnosis Date  . ABNORMAL EXAM-BILIARY TRACT 11/14/2008  . ABNORMAL TRANSAMINASE, (LFT'S) 11/14/2008    2005 US shows fatty liver  . Cancer ([place]) 1996   tongue cancer  . Diabetes mellitus without complication ([place])   . FASTING HYPERGLYCEMIA 09/07/2006  . GERD 09/07/2006  . HYPERLIPIDEMIA 09/07/2006  . HYPOTHYROIDISM 10/30/2006  . Nonspecific abnormal results of liver function study 09/07/2006  . Normal nuclear stress test 2005   stress cardiolite   . OSTEOARTHRITIS 09/07/2006  . Positive PPD    [place] vaccine [place]- shows positive after receiving  . 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 VITAMIN D DEFICIENCY 08/14/2009    Family History  Problem Relation Age of Onset  . Stroke Mother 30  . Arthritis Mother   . Rheum arthritis Brother   . Arthritis Brother   . Gout Brother   . Colon cancer Other   . Esophageal cancer Neg Hx   . Stomach cancer Neg Hx   . Rectal cancer Neg Hx     Social History   Socioeconomic History  . Marital status: Single    Spouse name: Not on file  . Number of children: Not on file  . Years of education: Not on file  . Highest education level: Not on file  Social Needs  . Financial resource strain: Not on file  . Food insecurity - worry: Not on file  . Food insecurity - inability: Not on file  . Transportation needs - medical: Not on file  . Transportation needs - non-medical: Not on file  Occupational History  . Occupation: PhD Professor  Tobacco Use  . Smoking status: Never Smoker  . Smokeless tobacco: Never Used  Substance and Sexual Activity  . Alcohol use: Yes    Alcohol/week: 0.6 oz    Types: 1 Glasses of wine per week    Comment: rarely  . Drug use: No  . Sexual activity: Not on file  Other Topics  Concern  . Not on file  Social History Narrative   HH of 2 Father and her    cat     PhD professor in information systems    No tobacco or alcohol exercising regularly currently    Outpatient Medications Prior to Visit  Medication Sig Dispense Refill  . albuterol (PROAIR HFA) 108 (90 Base) MCG/ACT inhaler Inhale 2 puffs into the lungs every 6 (six) hours as needed for wheezing or shortness of breath. 1 Inhaler 1  . Cholecalciferol 1000 UNITS capsule Take 1 capsule (1,000 Units total) by mouth daily. 90 capsule 3  . doxycycline (VIBRA-TABS) 100 MG tablet Take 1 tablet (100 mg total) by mouth 2 (two) times daily. 14 tablet 0  . glucose blood (ACCU-CHEK AVIVA PLUS) test strip Test twice daily. 100 each 12  . Lancet Devices (ACCU-CHEK SOFTCLIX) lancets Test twice daily. 1 each 12  . losartan (COZAAR) 50 MG tablet Take 1 tablet (50 mg total) by mouth daily. For high Blood pressure 90 tablet 1  . metFORMIN (GLUCOPHAGE-XR) 500 MG 24 hr tablet TAKE 2 TABLETS BY MOUTH DAILY 180 tablet 1  . Omega-3 Fatty Acids (FISH OIL) 1000 MG CAPS Take by mouth.      Marland Kitchen  predniSONE (STERAPRED UNI-PAK 21 TAB) 10 MG (21) TBPK tablet as directed.    . simvastatin (ZOCOR) 40 MG tablet TAKE 1 TABLET BY MOUTH AT BEDTIME 90 tablet 1  . SYNTHROID 75 MCG tablet Take 1 tablet (75 mcg total) by mouth daily. 90 tablet 1   No facility-administered medications prior to visit.      EXAM:  There were no vitals taken for this visit.  There is no height or weight on file to calculate BMI.  GENERAL: vitals reviewed and listed above, alert, oriented, appears well hydrated and in no acute distress HEENT: atraumatic, conjunctiva  clear, no obvious abnormalities on inspection of external nose and ears OP : no lesion edema or exudate  NECK: no obvious masses on inspection palpation  LUNGS: clear to auscultation bilaterally, no wheezes, rales or rhonchi, good air movement CV: HRRR, no clubbing cyanosis or  peripheral edema nl cap  refill  MS: moves all extremities without noticeable focal  abnormality PSYCH: pleasant and cooperative, no obvious depression or anxiety Lab Results  Component Value Date   WBC 5.5 12/09/2016   HGB 14.1 12/09/2016   HCT 42.5 12/09/2016   PLT 227.0 12/09/2016   GLUCOSE 106 (H) 12/09/2016   CHOL 126 12/09/2016   TRIG 115.0 12/09/2016   HDL 49.90 12/09/2016   LDLDIRECT 163.6 10/23/2006   LDLCALC 53 12/09/2016   ALT 18 12/09/2016   AST 19 12/09/2016   NA 140 12/09/2016   K 4.0 12/09/2016   CL 105 12/09/2016   CREATININE 0.67 12/09/2016   BUN 17 12/09/2016   CO2 27 12/09/2016   TSH 0.56 12/09/2016   INR 1.0 ratio 11/14/2008   HGBA1C 6.4 12/09/2016   MICROALBUR <0.7 12/09/2016   BP Readings from Last 3 Encounters:  02/07/17 (!) 164/78  12/06/16 140/80  07/22/16 (!) 140/96    ASSESSMENT AND PLAN:  Discussed the following assessment and plan:  Medication management  Cough, persistent  Essential hypertension  Hyperlipidemia, unspecified hyperlipidemia type  Type 2 diabetes mellitus without complication, without long-term current use of insulin ([place])  -Patient advised to return or notify health care team  if  new concerns arise.  There are no Patient Instructions on file for this visit.   Standley Brooking. [place] M.D.

## 2017-04-06 ENCOUNTER — Ambulatory Visit: Payer: 59 | Admitting: Internal Medicine

## 2017-04-10 ENCOUNTER — Ambulatory Visit: Payer: 59 | Admitting: Internal Medicine

## 2017-04-10 ENCOUNTER — Encounter: Payer: Self-pay | Admitting: Internal Medicine

## 2017-04-10 VITALS — BP 134/78 | HR 69 | Temp 98.1°F | Wt 125.6 lb

## 2017-04-10 DIAGNOSIS — R05 Cough: Secondary | ICD-10-CM | POA: Diagnosis not present

## 2017-04-10 DIAGNOSIS — R030 Elevated blood-pressure reading, without diagnosis of hypertension: Secondary | ICD-10-CM | POA: Diagnosis not present

## 2017-04-10 DIAGNOSIS — Z79899 Other long term (current) drug therapy: Secondary | ICD-10-CM | POA: Diagnosis not present

## 2017-04-10 DIAGNOSIS — R053 Chronic cough: Secondary | ICD-10-CM

## 2017-04-10 DIAGNOSIS — E039 Hypothyroidism, unspecified: Secondary | ICD-10-CM

## 2017-04-10 DIAGNOSIS — R7303 Prediabetes: Secondary | ICD-10-CM | POA: Diagnosis not present

## 2017-04-10 MED ORDER — SYNTHROID 75 MCG PO TABS: 75.0000 ug | ORAL_TABLET | Freq: Every day | ORAL | 1 refills | Status: DC

## 2017-04-10 NOTE — Progress Notes (Signed)
Chief Complaint  Patient presents with  . Follow-up    Pt notes that her BP ranges are down to a good baseline - 118-130s/60-80s. Pt reports that left arm is always higher readings than the right. Cough is resolved.     HPI: Debbie Marshall 71 y.o. come in for Chronic disease management   Bp seems to be better bu p and down  Didn't take medication  Yet  Cough and ur congestion   Got better   After med and came back November and saw  ENT  And he added  Antibiotic and  pred  And now better again    Considering surgery but has rewervations  About outcome and recurrance etc .   Doing better exercise recently  But some  Sugars recently  Wants to try lsi more for BP control  Left seems to be higher than right .  ROS: See pertinent positives and negatives per HPI.  Past Medical History:  Diagnosis Date  . ABNORMAL EXAM-BILIARY TRACT 11/14/2008  . ABNORMAL TRANSAMINASE, (LFT'S) 11/14/2008    2005 US shows fatty liver  . Cancer ([place]) 1996   tongue cancer  . Diabetes mellitus without complication ([place])   . FASTING HYPERGLYCEMIA 09/07/2006  . GERD 09/07/2006  . HYPERLIPIDEMIA 09/07/2006  . HYPOTHYROIDISM 10/30/2006  . Nonspecific abnormal results of liver function study 09/07/2006  . Normal nuclear stress test 2005   stress cardiolite   . OSTEOARTHRITIS 09/07/2006  . Positive PPD    [place] vaccine [place]- shows positive after receiving  . 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 VITAMIN D DEFICIENCY 08/14/2009    Family History  Problem Relation Age of Onset  . Stroke Mother 44  . Arthritis Mother   . Rheum arthritis Brother   . Arthritis Brother   . Gout Brother   . Colon cancer Other   . Esophageal cancer Neg Hx   . Stomach cancer Neg Hx   . Rectal cancer Neg Hx     Social History   Socioeconomic History  . Marital status: Single    Spouse name: None  . Number of children: None  . Years of education: None  . Highest education level: None    Social Needs  . Financial resource strain: None  . Food insecurity - worry: None  . Food insecurity - inability: None  . Transportation needs - medical: None  . Transportation needs - non-medical: None  Occupational History  . Occupation: PhD Professor  Tobacco Use  . Smoking status: Never Smoker  . Smokeless tobacco: Never Used  Substance and Sexual Activity  . Alcohol use: Yes    Alcohol/week: 0.6 oz    Types: 1 Glasses of wine per week    Comment: rarely  . Drug use: No  . Sexual activity: None  Other Topics Concern  . None  Social History Narrative   HH of 2 Father and her    cat     PhD professor in information systems    No tobacco or alcohol exercising regularly currently    Outpatient Medications Prior to Visit  Medication Sig Dispense Refill  . albuterol (PROAIR HFA) 108 (90 Base) MCG/ACT inhaler Inhale 2 puffs into the lungs every 6 (six) hours as needed for wheezing or shortness of breath. 1 Inhaler 1  . Cholecalciferol 1000 UNITS capsule Take 1 capsule (1,000 Units total) by mouth daily. 90 capsule 3  .  glucose blood (ACCU-CHEK AVIVA PLUS) test strip Test twice daily. 100 each 12  . Lancet Devices (ACCU-CHEK SOFTCLIX) lancets Test twice daily. 1 each 12  . metFORMIN (GLUCOPHAGE-XR) 500 MG 24 hr tablet TAKE 2 TABLETS BY MOUTH DAILY 180 tablet 1  . Omega-3 Fatty Acids (FISH OIL) 1000 MG CAPS Take by mouth.      . predniSONE (STERAPRED UNI-PAK 21 TAB) 10 MG (21) TBPK tablet as directed.    . simvastatin (ZOCOR) 40 MG tablet TAKE 1 TABLET BY MOUTH AT BEDTIME 90 tablet 1  . SYNTHROID 75 MCG tablet Take 1 tablet (75 mcg total) by mouth daily. 90 tablet 1  . losartan (COZAAR) 50 MG tablet Take 1 tablet (50 mg total) by mouth daily. For high Blood pressure (Patient not taking: Reported on 04/10/2017) 90 tablet 1  . doxycycline (VIBRA-TABS) 100 MG tablet Take 1 tablet (100 mg total) by mouth 2 (two) times daily. (Patient not taking: Reported on 04/10/2017) 14 tablet 0    No facility-administered medications prior to visit.      EXAM:  BP 134/78 (BP Location: Left Arm)   Pulse 69   Temp 98.1 F (36.7 C) (Oral)   Wt 125 lb 9.6 oz (57 kg)   BMI 28.16 kg/m   Body mass index is 28.16 kg/m. Repeat bp 138/78 right  Left 134/78 sitting at end of visit  GENERAL: vitals reviewed and listed above, alert, oriented, appears well hydrated and in no acute distress min congestion  HEENT: atraumatic, conjunctiva  clear, no obvious abnormalities on inspection of external nose and ears  NECK: no obvious masses on inspection palpation  LUNGS: clear to auscultation bilaterally, no wheezes, rales or rhonchi, good air movement CV: HRRR, no clubbing cyanosis or  peripheral edema nl cap refill  MS: moves all extremities without noticeable focal  abnormality PSYCH: pleasant and cooperative, no obvious depression or anxiety Lab Results  Component Value Date   WBC 5.5 12/09/2016   HGB 14.1 12/09/2016   HCT 42.5 12/09/2016   PLT 227.0 12/09/2016   GLUCOSE 106 (H) 12/09/2016   CHOL 126 12/09/2016   TRIG 115.0 12/09/2016   HDL 49.90 12/09/2016   LDLDIRECT 163.6 10/23/2006   LDLCALC 53 12/09/2016   ALT 18 12/09/2016   AST 19 12/09/2016   NA 140 12/09/2016   K 4.0 12/09/2016   CL 105 12/09/2016   CREATININE 0.67 12/09/2016   BUN 17 12/09/2016   CO2 27 12/09/2016   TSH 0.56 12/09/2016   INR 1.0 ratio 11/14/2008   HGBA1C 6.4 12/09/2016   MICROALBUR <0.7 12/09/2016   BP Readings from Last 3 Encounters:  04/10/17 134/78  02/07/17 (!) 164/78  12/06/16 140/80   Wt Readings from Last 3 Encounters:  04/10/17 125 lb 9.6 oz (57 kg)  02/07/17 124 lb 12.8 oz (56.6 kg)  12/06/16 121 lb 8 oz (55.1 kg)    ASSESSMENT AND PLAN:  Discussed the following assessment and plan:  Elevated BP without diagnosis of hypertension - see text wants to hold off poon meds will readdress  in feb visit   Prediabetes  Hypothyroidism, unspecified type - refill med   Medication  management  Cough, persistent - better after rx by ent antib and pred  recurring  Has appt 2 19 Ok to wait on medication disc  Benefit  More than risk of ARB  .ldsi And see how doing in fevruary  ( check a1c etc then .)  -Patient advised to return or notify health care  team  if  new concerns arise.  Patient Instructions   Glad you are doing better .   Continue lifestyle intervention healthy eating and exercise .    Check readings  At least 5 days in row  Twice a day    Second 2 readings average.   Can decide  In feb about medication .  Will check sugar in February .   Newer goals are  120/80 range .    If you have concerns or ?s   Get second opinion about sinus surgery if .    Dr Wilburn Cornelia in town does a lot of sinus surgery  Or    At other centers.          Standley Brooking. Jery Hollern M.D.

## 2017-04-10 NOTE — Patient Instructions (Signed)
Glad you are doing better .   Continue lifestyle intervention healthy eating and exercise .    Check readings  At least 5 days in row  Twice a day    Second 2 readings average.   Can decide  In feb about medication .  Will check sugar in February .   Newer goals are  120/80 range .    If you have concerns or ?s   Get second opinion about sinus surgery if .    Dr Wilburn Cornelia in town does a lot of sinus surgery  Or    At other centers.

## 2017-04-17 ENCOUNTER — Encounter: Payer: Self-pay | Admitting: Internal Medicine

## 2017-04-18 ENCOUNTER — Other Ambulatory Visit: Payer: Self-pay | Admitting: Internal Medicine

## 2017-04-18 NOTE — Telephone Encounter (Signed)
Please send refills to pharmacy iof her choice  90 days  At a time  And let her know that it has been done.  Thanks [facility]

## 2017-04-19 MED ORDER — SYNTHROID 75 MCG PO TABS: 75.0000 ug | ORAL_TABLET | Freq: Every day | ORAL | 1 refills | Status: DC

## 2017-04-19 NOTE — Telephone Encounter (Signed)
Synthroid refilled Pt notified.  Nothing further needed.

## 2017-06-06 ENCOUNTER — Ambulatory Visit: Payer: 59 | Admitting: Internal Medicine

## 2017-09-11 ENCOUNTER — Telehealth: Payer: Self-pay | Admitting: Internal Medicine

## 2017-09-11 NOTE — Telephone Encounter (Signed)
Copied from [place] [id number]. Topic: Quick Communication - Rx Refill/Question >> Sep 11, 2017  4:39 PM Neva Seat wrote: predniSONE (STERAPRED UNI-PAK 21 TAB) 10 MG (21) TBPK tablet Amoxicillin  Albuterol 108 / 90 base mg/act inhaler   [id number] - [place], [place] [address] [place] [address] Phone: [phone] Fax: [phone]

## 2017-09-12 DIAGNOSIS — Z0100 Encounter for examination of eyes and vision without abnormal findings: Secondary | ICD-10-CM | POA: Diagnosis not present

## 2017-09-13 ENCOUNTER — Ambulatory Visit: Payer: Self-pay | Admitting: *Deleted

## 2017-09-13 ENCOUNTER — Ambulatory Visit: Payer: 59 | Admitting: Family Medicine

## 2017-09-13 ENCOUNTER — Encounter: Payer: Self-pay | Admitting: Family Medicine

## 2017-09-13 VITALS — BP 118/76 | HR 61 | Temp 98.4°F | Resp 16 | Ht 60.0 in | Wt 121.2 lb

## 2017-09-13 DIAGNOSIS — R05 Cough: Secondary | ICD-10-CM

## 2017-09-13 DIAGNOSIS — R0989 Other specified symptoms and signs involving the circulatory and respiratory systems: Secondary | ICD-10-CM | POA: Diagnosis not present

## 2017-09-13 DIAGNOSIS — J302 Other seasonal allergic rhinitis: Secondary | ICD-10-CM | POA: Diagnosis not present

## 2017-09-13 DIAGNOSIS — J989 Respiratory disorder, unspecified: Secondary | ICD-10-CM

## 2017-09-13 DIAGNOSIS — R059 Cough, unspecified: Secondary | ICD-10-CM

## 2017-09-13 MED ORDER — DOXYCYCLINE HYCLATE 100 MG PO TABS: 100.0000 mg | ORAL_TABLET | Freq: Two times a day (BID) | ORAL | 0 refills | Status: AC

## 2017-09-13 MED ORDER — FLUTICASONE PROPIONATE 50 MCG/ACT NA SUSP: 2.0000 | Freq: Every day | NASAL | 2 refills | Status: DC

## 2017-09-13 MED ORDER — PREDNISONE 20 MG PO TABS: 40.0000 mg | ORAL_TABLET | Freq: Every day | ORAL | 0 refills | Status: AC

## 2017-09-13 MED ORDER — ALBUTEROL SULFATE HFA 108 (90 BASE) MCG/ACT IN AERS: 2.0000 | INHALATION_SPRAY | Freq: Four times a day (QID) | RESPIRATORY_TRACT | 1 refills | Status: DC | PRN

## 2017-09-13 MED ORDER — BENZONATATE 100 MG PO CAPS: 200.0000 mg | ORAL_CAPSULE | Freq: Two times a day (BID) | ORAL | 0 refills | Status: AC | PRN

## 2017-09-13 NOTE — Patient Instructions (Signed)
  [name] I have seen you today for an acute visit.  A few things to remember from today's visit:   Reactive airway disease that is not asthma - Plan: predniSONE (DELTASONE) 20 MG tablet, albuterol (PROAIR HFA) 108 (90 Base) MCG/ACT inhaler  Seasonal allergic rhinitis, unspecified trigger - Plan: predniSONE (DELTASONE) 20 MG tablet   Medications prescribed today are intended for short period of time and will not be refill upon request, a follow up appointment might be necessary to discuss continuation of of treatment if appropriate.   Albuterol inh 2 puff every 6 hours for a week then as needed for wheezing or shortness of breath.  Prednisone with food in the morning. I do not think you need antibiotics at this time, because you are leaving town I sent a prescription for antibiotics in case you develop a fever or symptoms get worse.   In general please monitor for signs of worsening symptoms and seek immediate medical attention if any concerning.  If symptoms are not resolved in 1-2 weeks you should schedule a follow up appointment with your doctor, before if needed.  I hope you get better soon!

## 2017-09-13 NOTE — Telephone Encounter (Signed)
Patient reports symptoms of cough nasal congestion symptoms not relieved by OTC meds. Similar episode   approx 6 months ago . Was rx meds by Dr Regis Bill in past that helped. Same day APPT made with Dr Neita Garnet    Reason for Disposition . [1] Continuous (nonstop) coughing interferes with work or school AND [2] no improvement using cough treatment per Care Advice  Answer Assessment - Initial Assessment Questions 1. ONSET: "When did the cough begin?"       Symptoms begin 2-3 weeks getting worse   2. SEVERITY: "How bad is the cough today?"         Bad -  Continuously    3. RESPIRATORY DISTRESS: "Describe your breathing."         Wheezing  Mild  Shortness of breath  Worse when lays down  4. FEVER: "Do you have a fever?" If so, ask: "What is your temperature, how was it measured, and when did it start?"       No  5. SPUTUM: "Describe the color of your sputum" (clear, white, yellow, green)        White  Yellowish tint   6. HEMOPTYSIS: "Are you coughing up any blood?" If so ask: "How much?" (flecks, streaks, tablespoons, etc.)     no 7. CARDIAC HISTORY: "Do you have any history of heart disease?" (e.g., heart attack, congestive heart failure)      no 8. LUNG HISTORY: "Do you have any history of lung disease?"  (e.g., pulmonary embolus, asthma, emphysema)       No   9. PE RISK FACTORS: "Do you have a history of blood clots?" (or: recent major surgery, recent prolonged travel, bedridden )       no 10. OTHER SYMPTOMS: "Do you have any other symptoms?" (e.g., runny nose, wheezing, chest pain)       Wheezing   Pain in throat stuffy nose   11. PREGNANCY: "Is there any chance you are pregnant?" "When was your last menstrual period?"       n/ 12. TRAVEL: "Have you traveled out of the country in the last month?" (e.g., travel history, exposures)       NO  Protocols used: [place]

## 2017-09-13 NOTE — Progress Notes (Signed)
ACUTE VISIT  HPI:  Chief Complaint  Patient presents with  . Cough    started about 10 days ago  . Nasal Congestion    DebbieMarinda Marshall is a 72 y.o.female here today complaining of pain days of respiratory symptoms.  Productive cough, worse in the morning. No hemoptysis. Associated nasal congestion and rhinorrhea. States that she has had similar episodes in the past and has been prescribed Prednisone and oral antibiotic. She denies history of asthma or COPD. No history of tobacco use.  She has not identified exacerbating or alleviating factors for the cough. Nasal congestion and rhinorrhea usually worse around this time of the year due to pollen.  No associated fever, chills, or body aches.  Cough  This is a new problem. The current episode started 1 to 4 weeks ago. The problem has been unchanged. The cough is productive of sputum. Associated symptoms include nasal congestion, postnasal drip, rhinorrhea and wheezing. Pertinent negatives include no chills, ear congestion, ear pain, eye redness, fever, headaches, heartburn, hemoptysis, myalgias, rash, sore throat, shortness of breath, sweats or weight loss. The symptoms are aggravated by pollens. Her past medical history is significant for environmental allergies. There is no history of asthma or COPD.    In the past she has used albuterol inhaler.  She is requesting something that helps with the cough.   No Hx of recent travel. No sick contact. No known insect bite.  Hx of allergies: Allergic rhinitis and nasal polyps.  She is no longer on Flonase nasal spray.  OTC medications for this problem: Zyrtec.    Noted history of DM 2 on problem list, she states that she has history of prediabetes.    Review of Systems  Constitutional: Positive for fatigue. Negative for activity change, appetite change, chills, fever and weight loss.  HENT: Positive for congestion, postnasal drip and rhinorrhea. Negative for ear pain,  mouth sores, sinus pressure, sore throat and voice change.   Eyes: Negative for discharge, redness and itching.  Respiratory: Positive for cough and wheezing. Negative for hemoptysis, chest tightness and shortness of breath.   Gastrointestinal: Negative for abdominal pain, diarrhea, heartburn, nausea and vomiting.  Musculoskeletal: Negative for gait problem and myalgias.  Skin: Negative for rash.  Allergic/Immunologic: Positive for environmental allergies.  Neurological: Negative for weakness and headaches.  Hematological: Negative for adenopathy. Does not bruise/bleed easily.      Current Outpatient Medications on File Prior to Visit  Medication Sig Dispense Refill  . Cholecalciferol 1000 UNITS capsule Take 1 capsule (1,000 Units total) by mouth daily. 90 capsule 3  . glucose blood (ACCU-CHEK AVIVA PLUS) test strip Test twice daily. 100 each 12  . Lancet Devices (ACCU-CHEK SOFTCLIX) lancets Test twice daily. 1 each 12  . metFORMIN (GLUCOPHAGE-XR) 500 MG 24 hr tablet TAKE 2 TABLETS BY MOUTH DAILY 180 tablet 1  . Omega-3 Fatty Acids (FISH OIL) 1000 MG CAPS Take by mouth.      . simvastatin (ZOCOR) 40 MG tablet TAKE 1 TABLET BY MOUTH AT BEDTIME 90 tablet 1  . SYNTHROID 75 MCG tablet Take 1 tablet (75 mcg total) by mouth daily. 90 tablet 1  . losartan (COZAAR) 50 MG tablet Take 1 tablet (50 mg total) by mouth daily. For high Blood pressure (Patient not taking: Reported on 04/10/2017) 90 tablet 1   No current facility-administered medications on file prior to visit.      Past Medical History:  Diagnosis Date  . ABNORMAL EXAM-BILIARY  TRACT 11/14/2008  . ABNORMAL TRANSAMINASE, (LFT'S) 11/14/2008    2005 US shows fatty liver  . Cancer ([place]) 1996   tongue cancer  . Diabetes mellitus without complication ([place])   . FASTING HYPERGLYCEMIA 09/07/2006  . GERD 09/07/2006  . HYPERLIPIDEMIA 09/07/2006  . HYPOTHYROIDISM 10/30/2006  . Nonspecific abnormal results of liver function study 09/07/2006  .  Normal nuclear stress test 2005   stress cardiolite   . OSTEOARTHRITIS 09/07/2006  . Positive PPD    received vaccine in home country- shows positive after receiving  . 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 VITAMIN D DEFICIENCY 08/14/2009   No Known Allergies  Social History   Socioeconomic History  . Marital status: Single    Spouse name: Not on file  . Number of children: Not on file  . Years of education: Not on file  . Highest education level: Not on file  Occupational History  . Occupation: PhD Professor  Social Needs  . Financial resource strain: Not on file  . Food insecurity:    Worry: Not on file    Inability: Not on file  . Transportation needs:    Medical: Not on file    Non-medical: Not on file  Tobacco Use  . Smoking status: Never Smoker  . Smokeless tobacco: Never Used  Substance and Sexual Activity  . Alcohol use: Yes    Alcohol/week: 0.6 oz    Types: 1 Glasses of wine per week    Comment: rarely  . Drug use: No  . Sexual activity: Not on file  Lifestyle  . Physical activity:    Days per week: Not on file    Minutes per session: Not on file  . Stress: Not on file  Relationships  . Social connections:    Talks on phone: Not on file    Gets together: Not on file    Attends religious service: Not on file    Active member of club or organization: Not on file    Attends meetings of clubs or organizations: Not on file    Relationship status: Not on file  Other Topics Concern  . Not on file  Social History Narrative   HH of 2 Father and her    cat     PhD professor in information systems    No tobacco or alcohol exercising regularly currently    Vitals:   09/13/17 1504  BP: 118/76  Pulse: 61  Resp: 16  Temp: 98.4 F (36.9 C)  SpO2: 96%   Body mass index is 23.68 kg/m.   Physical Exam  Nursing note and vitals reviewed. Constitutional: She is oriented to person, place, and time. She appears well-developed  and well-nourished. She does not appear ill. No distress.  HENT:  Head: Normocephalic and atraumatic.  Nose: Rhinorrhea present. Right sinus exhibits no maxillary sinus tenderness and no frontal sinus tenderness. Left sinus exhibits no maxillary sinus tenderness and no frontal sinus tenderness.  Mouth/Throat: Oropharynx is clear and moist and mucous membranes are normal.  Hypertrophic turbinates. Nasal voice. Postnasal drainage.  Eyes: Pupils are equal, round, and reactive to light. Conjunctivae are normal.  Cardiovascular: Normal rate and regular rhythm.  No murmur heard. Respiratory: Effort normal and breath sounds normal. No respiratory distress.  Lymphadenopathy:       Head (right side): No submandibular adenopathy present.       Head (left side): No submandibular adenopathy present.  She has no cervical adenopathy.  Neurological: She is alert and oriented to person, place, and time. She has normal strength.  Skin: Skin is warm. No rash noted. No erythema.  Psychiatric: She has a normal mood and affect. Her speech is normal.  Well groomed, good eye contact.    ASSESSMENT AND PLAN:  Debbie Marshall was seen today for cough and nasal congestion.  Diagnoses and all orders for this visit:  Reactive airway disease that is not asthma  Today I do not appreciate wheezing on auscultation. After discussion of some side effects, including elevation of BS and risk of fractures, she agrees with trying short course of prednisone. Albuterol inh 2 puff every 6 hours for a week then as needed for wheezing or shortness of breath.  She will monitor for warning signs.   I do not think antibiotics are needed at this time.   Because she is leaving town, I sent prescription for doxycycline and recommended starting if she develops fever or symptoms get worse.  Follow-up with PCP as needed.  -     predniSONE (DELTASONE) 20 MG tablet; Take 2 tablets (40 mg total) by mouth daily with breakfast for 5  days. -     albuterol (PROAIR HFA) 108 (90 Base) MCG/ACT inhaler; Inhale 2 puffs into the lungs every 6 (six) hours as needed for wheezing or shortness of breath.  Seasonal allergic rhinitis, unspecified trigger  Prednisone may help with acute symptoms. Flonase nasal spray daily. Continue OTC Zyrtec 10 mg daily. Nasal irrigations as needed also recommended.  -     predniSONE (DELTASONE) 20 MG tablet; Take 2 tablets (40 mg total) by mouth daily with breakfast for 5 days.  Cough  Explained that cough could be related to bronchospasm, in which case Albuterol will help. Benzonatate may also help. I do not think imaging is needed today. Plenty of p.o. fluids. Plain Mucinex also recommended.  -     benzonatate (TESSALON) 100 MG capsule; Take 2 capsules (200 mg total) by mouth 2 (two) times daily as needed for up to 10 days.  Other orders -     doxycycline (VIBRA-TABS) 100 MG tablet; Take 1 tablet (100 mg total) by mouth 2 (two) times daily for 7 days. With food    Norell Brisbin G. [place], MD  [facility]. [place] office.

## 2017-09-13 NOTE — Telephone Encounter (Signed)
Patient called, left detailed message to call the office back to speak with a [place] about the symptoms she's having as to why she needs prednisone, albuterol inhaler and amoxicillin.

## 2017-10-07 ENCOUNTER — Telehealth: Payer: Self-pay | Admitting: Internal Medicine

## 2017-10-07 NOTE — Telephone Encounter (Signed)
Per [employer] Message:  Reason for call: request to schedule office appointment.  Comment: Debbie Marshall, DOB 01/01/1946, declined triage, requests call back to [phone] when office opens for Saturday appointment.   I called the patient, CRM in place as she did not answer.

## 2017-10-09 NOTE — Telephone Encounter (Signed)
I have left patient a vm to call back to schedule appt.

## 2017-10-25 ENCOUNTER — Other Ambulatory Visit: Payer: Self-pay | Admitting: Internal Medicine

## 2017-10-25 DIAGNOSIS — Z1231 Encounter for screening mammogram for malignant neoplasm of breast: Secondary | ICD-10-CM

## 2017-11-16 ENCOUNTER — Ambulatory Visit
Admission: RE | Admit: 2017-11-16 | Discharge: 2017-11-16 | Disposition: A | Discharge status: Home / Self Care | Payer: 59 | Source: Ambulatory Visit | Attending: Internal Medicine | Admitting: Internal Medicine

## 2017-11-16 DIAGNOSIS — Z1231 Encounter for screening mammogram for malignant neoplasm of breast: Secondary | ICD-10-CM

## 2017-12-20 NOTE — Progress Notes (Signed)
Chief Complaint  Patient presents with  . Annual Exam    no new concerns  . Medication Management  . Hyperlipidemia  . Hypothyroidism  . Diabetes    HPI: Patient  Debbie Marshall  72 y.o. comes in today for Preventive Health Care visit  And Chronic disease management   Dm not checking recently but has been good   No sig eye neuro sx  BP  Good  readings with lsi and not taking the bp med as no need thyrod  No change in med Lipids taking med no se  resp ur nasal congestion and polyps?  Better after a course of steroid pred  onl used flonase 1 spray qd each nostril some congestion but better   Health Maintenance  Topic Date Due  . OPHTHALMOLOGY EXAM  11/19/2015  . FOOT EXAM  12/06/2017  . COLONOSCOPY  01/12/2018  . HEMOGLOBIN A1C  06/22/2018  . MAMMOGRAM  11/17/2019  . TETANUS/TDAP  06/13/2023  . INFLUENZA VACCINE  Completed  . DEXA SCAN  Completed  . Hepatitis C Screening  Completed  . PNA vac Low Risk Adult  Completed   Health Maintenance Review LIFESTYLE:  Exercise:   Trying  y 3  Per week.  Tobacco/ETS: no Alcohol:   no Sugar beverage s: natrual sugar  Sleep: 6-7  Drug use: no HH of  2  Fathers  1 cat  Work:  Ft     ROS:   See hpi GEN/ HEENT: No fever, significant weight changes sweats headaches vision problems hearing changes, CV/ PULM; No chest pain shortness of breath cough, syncope,edema  change in exercise tolerance. GI /GU: No adominal pain, vomiting, change in bowel habits. No blood in the stool. No significant GU symptoms. SKIN/HEME: ,no acute skin rashes suspicious lesions or bleeding. No lymphadenopathy, nodules, masses.  NEURO/ PSYCH:  No neurologic signs such as weakness numbness. No depression anxiety. IMM/ Allergy: No unusual infections.  Allergy .   REST of 12 system review negative except as per HPI   Past Medical History:  Diagnosis Date  . ABNORMAL EXAM-BILIARY TRACT 11/14/2008  . ABNORMAL TRANSAMINASE, (LFT'S) 11/14/2008    2005 US shows fatty  liver  . Cancer ([place]) 1996   tongue cancer  . Diabetes mellitus without complication ([place])   . FASTING HYPERGLYCEMIA 09/07/2006  . GERD 09/07/2006  . HYPERLIPIDEMIA 09/07/2006  . HYPOTHYROIDISM 10/30/2006  . Nonspecific abnormal results of liver function study 09/07/2006  . Normal nuclear stress test 2005   stress cardiolite   . OSTEOARTHRITIS 09/07/2006  . Positive PPD    received vaccine in home country- shows positive after receiving  . 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 VITAMIN D DEFICIENCY 08/14/2009    Past Surgical History:  Procedure Laterality Date  . COLONOSCOPY    . [place]   Tumor removed on back of tongue and salivary gland    Family History  Problem Relation Age of Onset  . Stroke Mother 2  . Arthritis Mother   . Rheum arthritis Brother   . Arthritis Brother   . Gout Brother   . Colon cancer Other   . Esophageal cancer Neg Hx   . Stomach cancer Neg Hx   . Rectal cancer Neg Hx     Social History   Socioeconomic History  . Marital status: Single    Spouse name: Not on file  . Number of children:  Not on file  . Years of education: Not on file  . Highest education level: Not on file  Occupational History  . Occupation: PhD Professor  Social Needs  . Financial resource strain: Not on file  . Food insecurity:    Worry: Not on file    Inability: Not on file  . Transportation needs:    Medical: Not on file    Non-medical: Not on file  Tobacco Use  . Smoking status: Never Smoker  . Smokeless tobacco: Never Used  Substance and Sexual Activity  . Alcohol use: Yes    Alcohol/week: 1.0 standard drinks    Types: 1 Glasses of wine per week    Comment: rarely  . Drug use: No  . Sexual activity: Not on file  Lifestyle  . Physical activity:    Days per week: Not on file    Minutes per session: Not on file  . Stress: Not on file  Relationships  . Social connections:    Talks on phone: Not on file     Gets together: Not on file    Attends religious service: Not on file    Active member of club or organization: Not on file    Attends meetings of clubs or organizations: Not on file    Relationship status: Not on file  Other Topics Concern  . Not on file  Social History Narrative   HH of 2 Father and her    cat     PhD professor in information systems    No tobacco or alcohol exercising regularly currently    Allergies as of 12/21/2017   No Known Allergies     Medication List        Accurate as of 12/21/17 11:59 PM. Always use your most recent med list.          accu-chek softclix lancets Test twice daily.   albuterol 108 (90 Base) MCG/ACT inhaler Commonly known as:  PROVENTIL HFA;VENTOLIN HFA Inhale 2 puffs into the lungs every 6 (six) hours as needed for wheezing or shortness of breath.   Cholecalciferol 1000 units capsule Take 1 capsule (1,000 Units total) by mouth daily.   Fish Oil 1000 MG Caps Take by mouth.   fluticasone 50 MCG/ACT nasal spray Commonly known as:  FLONASE Place 2 sprays into both nostrils daily.   glucose blood test strip Test twice daily.   metFORMIN 500 MG 24 hr tablet Commonly known as:  GLUCOPHAGE-XR TAKE 2 TABLETS BY MOUTH DAILY   simvastatin 40 MG tablet Commonly known as:  ZOCOR TAKE 1 TABLET BY MOUTH AT BEDTIME   SYNTHROID 75 MCG tablet Generic drug:  levothyroxine Take 1 tablet (75 mcg total) by mouth daily.         EXAM:  BP 124/70 (BP Location: Right Arm, Patient Position: Sitting, Cuff Size: Normal)   Pulse 60   Temp 98.1 F (36.7 C) (Oral)   Wt 122 lb 12.8 oz (55.7 kg)   BMI 23.98 kg/m   Body mass index is 23.98 kg/m. Wt Readings from Last 3 Encounters:  12/21/17 122 lb 12.8 oz (55.7 kg)  09/13/17 121 lb 4 oz (55 kg)  04/10/17 125 lb 9.6 oz (57 kg)    Physical Exam: Vital signs reviewed WGN:FAOZ is a well-developed well-nourished alert cooperative    who appearsr stated age in no acute distress.    HEENT: normocephalic atraumatic , Eyes: PERRL EOM's full, conjunctiva clear, Nares: paten,t no deformity discharge or tenderness. Mild  congestion, Ears: no deformity EAC's clear TMs with normal landmarks. Mouth: clear OP, no lesions, edema.  Moist mucous membranes. Dentition in adequate repair. NECK: supple without masses, thyromegaly or bruits. CHEST/PULM:  Clear to auscultation and percussion breath sounds equal no wheeze , rales or rhonchi. No chest wall deformities or tenderness. Breast: normal by inspection . No dimpling, discharge, masses, tenderness or discharge . CV: PMI is nondisplaced, S1 S2 no gallops, murmurs, rubs. Peripheral pulses are full without delay.No JVD .  ABDOMEN: Bowel sounds normal nontender  No guard or rebound, no hepato splenomegal no CVA tenderness.  No hernia. Extremtities:  No clubbing cyanosis or edema, no acute joint swelling or redness no focal atrophy NEURO:  Oriented x3, cranial nerves 3-12 appear to be intact, no obvious focal weakness,gait within normal limits no abnormal reflexes or asymmetrical SKIN: No acute rashes normal turgor, color, no bruising or petechiae. PSYCH: Oriented, good eye contact, no obvious depression anxiety, cognition and judgment appear normal. LN: no cervical axillary inguinal adenopathy Diabetic Foot Exam - Simple   Simple Foot Form Diabetic Foot exam was performed with the following findings:  Yes 12/21/2017 10:50 AM  Visual Inspection No deformities, no ulcerations, no other skin breakdown bilaterally:  Yes Sensation Testing Intact to touch and monofilament testing bilaterally:  Yes Pulse Check Posterior Tibialis and Dorsalis pulse intact bilaterally:  Yes Comments      Lab Results  Component Value Date   WBC 6.7 12/22/2017   HGB 14.5 12/22/2017   HCT 43.0 12/22/2017   PLT 219.0 12/22/2017   GLUCOSE 108 (H) 12/22/2017   CHOL 127 12/22/2017   TRIG 148.0 12/22/2017   HDL 56.10 12/22/2017   LDLDIRECT 163.6 10/23/2006    LDLCALC 41 12/22/2017   ALT 18 12/22/2017   AST 15 12/22/2017   NA 142 12/22/2017   K 4.4 12/22/2017   CL 106 12/22/2017   CREATININE 0.66 12/22/2017   BUN 11 12/22/2017   CO2 27 12/22/2017   TSH 0.72 12/22/2017   INR 1.0 ratio 11/14/2008   HGBA1C 6.8 (H) 12/22/2017   MICROALBUR <0.7 12/22/2017    BP Readings from Last 3 Encounters:  12/21/17 124/70  09/13/17 118/76  04/10/17 134/78   Wt Readings from Last 3 Encounters:  12/21/17 122 lb 12.8 oz (55.7 kg)  09/13/17 121 lb 4 oz (55 kg)  04/10/17 125 lb 9.6 oz (57 kg)     Lab plan reviewed with patient   ASSESSMENT AND PLAN:  Discussed the following assessment and plan:  Visit for preventive health examination - Plan: Basic Metabolic Panel, CBC with Differential/Platelets, Hemoglobin A1c, Hep B Surface Antigen, Hepatic Function Panel, Hepatitis B surface antibody,qualitative, Lipid Panel, Microalbumin/Creatinine Ratio, Urine, TSH, TSH, Microalbumin/Creatinine Ratio, Urine, Lipid Panel, Hepatitis B surface antibody,qualitative, Hepatic Function Panel, Hep B Surface Antigen, Hemoglobin A1c, CBC with Differential/Platelets, Basic Metabolic Panel, CANCELED: Basic metabolic panel, CANCELED: CBC with Differential/Platelet, CANCELED: Hemoglobin A1c, CANCELED: Hepatic function panel, CANCELED: Lipid panel, CANCELED: Microalbumin / creatinine urine ratio, CANCELED: TSH, CANCELED: Hepatitis B surface antibody,qualitative, CANCELED: Hepatitis B surface antigen, CANCELED: Basic Metabolic Panel, CANCELED: CBC with Differential/Platelets, CANCELED: Hemoglobin A1c, CANCELED: Hepatic Function Panel, CANCELED: Hepatitis B surface antibody,qualitative, CANCELED: Hep B Surface Antigen, CANCELED: Lipid Panel, CANCELED: Microalbumin/Creatinine Ratio, Urine, CANCELED: TSH  Medication management - Plan: CANCELED: Basic metabolic panel, CANCELED: CBC with Differential/Platelet, CANCELED: Hemoglobin A1c, CANCELED: Hepatic function panel, CANCELED: Lipid  panel, CANCELED: Microalbumin / creatinine urine ratio, CANCELED: TSH, CANCELED: Hepatitis B surface antibody,qualitative, CANCELED: Hepatitis B surface  antigen, CANCELED: Hepatic Function Panel  Type 2 diabetes mellitus without complication, without long-term current use of insulin (HCC) - Plan: Hemoglobin A1c, Microalbumin/Creatinine Ratio, Urine, Microalbumin/Creatinine Ratio, Urine, Hemoglobin A1c, CANCELED: Basic metabolic panel, CANCELED: CBC with Differential/Platelet, CANCELED: Hemoglobin A1c, CANCELED: Hepatic function panel, CANCELED: Lipid panel, CANCELED: Microalbumin / creatinine urine ratio, CANCELED: TSH, CANCELED: Hepatitis B surface antibody,qualitative, CANCELED: Hepatitis B surface antigen, CANCELED: Hemoglobin A1c, CANCELED: Microalbumin/Creatinine Ratio, Urine  Hyperlipidemia, unspecified hyperlipidemia type - Plan: Lipid Panel, Lipid Panel, CANCELED: Basic metabolic panel, CANCELED: CBC with Differential/Platelet, CANCELED: Hemoglobin A1c, CANCELED: Hepatic function panel, CANCELED: Lipid panel, CANCELED: Microalbumin / creatinine urine ratio, CANCELED: TSH, CANCELED: Hepatitis B surface antibody,qualitative, CANCELED: Hepatitis B surface antigen, CANCELED: Lipid Panel  Hypothyroidism, unspecified type - Plan: TSH, TSH, CANCELED: Basic metabolic panel, CANCELED: CBC with Differential/Platelet, CANCELED: Hemoglobin A1c, CANCELED: Hepatic function panel, CANCELED: Lipid panel, CANCELED: Microalbumin / creatinine urine ratio, CANCELED: TSH, CANCELED: Hepatitis B surface antibody,qualitative, CANCELED: Hepatitis B surface antigen, CANCELED: TSH  Essential hypertension - Plan: CANCELED: Basic metabolic panel, CANCELED: CBC with Differential/Platelet, CANCELED: Hemoglobin A1c, CANCELED: Hepatic function panel, CANCELED: Lipid panel, CANCELED: Microalbumin / creatinine urine ratio, CANCELED: TSH, CANCELED: Hepatitis B surface antibody,qualitative  Need for influenza vaccination - Plan: Flu  vaccine HIGH DOSE PF (Fluzone High dose)  Immunity status testing - Plan: Hep B Surface Antigen, Hepatitis B surface antibody,qualitative, Hepatitis B surface antibody,qualitative, Hep B Surface Antigen, CANCELED: Basic metabolic panel, CANCELED: CBC with Differential/Platelet, CANCELED: Hemoglobin A1c, CANCELED: Hepatic function panel, CANCELED: Lipid panel, CANCELED: Microalbumin / creatinine urine ratio, CANCELED: TSH, CANCELED: Hepatitis B surface antibody,qualitative, CANCELED: Hepatitis B surface antigen, CANCELED: Hepatitis B surface antibody,qualitative, CANCELED: Hep B Surface Antigen  Nasal congestion - recurrent  respond to steroids  adv bid 2 sprays flonase to suppress disc hep screening   remote hx of neg hep c and b?   Pt asks as  acquaintances was well and developed liver cancer  And interested in screening  Patient Care Team: Ophelia Sipe, Standley Brooking, MD as PCP - General Patient Instructions  Glad you are  Doing well Continue lifestyle intervention healthy eating and exercise .  Will notify you  of labs when available.  Use flonase 2 sprays in each nostril 2 x per day  To suppress   Sinus nasal congestion.    Health Maintenance, Female Adopting a healthy lifestyle and getting preventive care can go a long way to promote health and wellness. Talk with your health care provider about what schedule of regular examinations is right for you. This is a good chance for you to check in with your provider about disease prevention and staying healthy. In between checkups, there are plenty of things you can do on your own. Experts have done a lot of research about which lifestyle changes and preventive measures are most likely to keep you healthy. Ask your health care provider for more information. Weight and diet Eat a healthy diet  Be sure to include plenty of vegetables, fruits, low-fat dairy products, and lean protein.  Do not eat a lot of foods high in solid fats, added sugars, or  salt.  Get regular exercise. This is one of the most important things you can do for your health. ? Most adults should exercise for at least 150 minutes each week. The exercise should increase your heart rate and make you sweat (moderate-intensity exercise). ? Most adults should also do strengthening exercises at least twice a week. This is in addition to the moderate-intensity  exercise.  Maintain a healthy weight  Body mass index (BMI) is a measurement that can be used to identify possible weight problems. It estimates body fat based on height and weight. Your health care provider can help determine your BMI and help you achieve or maintain a healthy weight.  For females 85 years of age and older: ? A BMI below 18.5 is considered underweight. ? A BMI of 18.5 to 24.9 is normal. ? A BMI of 25 to 29.9 is considered overweight. ? A BMI of 30 and above is considered obese.  Watch levels of cholesterol and blood lipids  You should start having your blood tested for lipids and cholesterol at 70 years of age, then have this test every 5 years.  You may need to have your cholesterol levels checked more often if: ? Your lipid or cholesterol levels are high. ? You are older than 70 years of age. ? You are at high risk for heart disease.  Cancer screening Lung Cancer  Lung cancer screening is recommended for adults 48-64 years old who are at high risk for lung cancer because of a history of smoking.  A yearly low-dose CT scan of the lungs is recommended for people who: ? Currently smoke. ? Have quit within the past 15 years. ? Have at least a 30-pack-year history of smoking. A pack year is smoking an average of one pack of cigarettes a day for 1 year.  Yearly screening should continue until it has been 15 years since you quit.  Yearly screening should stop if you develop a health problem that would prevent you from having lung cancer treatment.  Breast Cancer  Practice breast  self-awareness. This means understanding how your breasts normally appear and feel.  It also means doing regular breast self-exams. Let your health care provider know about any changes, no matter how small.  If you are in your 20s or 30s, you should have a clinical breast exam (CBE) by a health care provider every 1-3 years as part of a regular health exam.  If you are 15 or older, have a CBE every year. Also consider having a breast X-ray (mammogram) every year.  If you have a family history of breast cancer, talk to your health care provider about genetic screening.  If you are at high risk for breast cancer, talk to your health care provider about having an MRI and a mammogram every year.  Breast cancer gene (BRCA) assessment is recommended for women who have family members with BRCA-related cancers. BRCA-related cancers include: ? Breast. ? Ovarian. ? Tubal. ? Peritoneal cancers.  Results of the assessment will determine the need for genetic counseling and BRCA1 and BRCA2 testing.  Cervical Cancer Your health care provider may recommend that you be screened regularly for cancer of the pelvic organs (ovaries, uterus, and vagina). This screening involves a pelvic examination, including checking for microscopic changes to the surface of your cervix (Pap test). You may be encouraged to have this screening done every 3 years, beginning at age 61.  For women ages 37-65, health care providers may recommend pelvic exams and Pap testing every 3 years, or they may recommend the Pap and pelvic exam, combined with testing for human papilloma virus (HPV), every 5 years. Some types of HPV increase your risk of cervical cancer. Testing for HPV may also be done on women of any age with unclear Pap test results.  Other health care providers may not recommend any screening for nonpregnant  women who are considered low risk for pelvic cancer and who do not have symptoms. Ask your health care provider if a  screening pelvic exam is right for you.  If you have had past treatment for cervical cancer or a condition that could lead to cancer, you need Pap tests and screening for cancer for at least 20 years after your treatment. If Pap tests have been discontinued, your risk factors (such as having a new sexual partner) need to be reassessed to determine if screening should resume. Some women have medical problems that increase the chance of getting cervical cancer. In these cases, your health care provider may recommend more frequent screening and Pap tests.  Colorectal Cancer  This type of cancer can be detected and often prevented.  Routine colorectal cancer screening usually begins at 70 years of age and continues through 70 years of age.  Your health care provider may recommend screening at an earlier age if you have risk factors for colon cancer.  Your health care provider may also recommend using home test kits to check for hidden blood in the stool.  A small camera at the end of a tube can be used to examine your colon directly (sigmoidoscopy or colonoscopy). This is done to check for the earliest forms of colorectal cancer.  Routine screening usually begins at age 31.  Direct examination of the colon should be repeated every 5-10 years through 70 years of age. However, you may need to be screened more often if early forms of precancerous polyps or small growths are found.  Skin Cancer  Check your skin from head to toe regularly.  Tell your health care provider about any new moles or changes in moles, especially if there is a change in a mole's shape or color.  Also tell your health care provider if you have a mole that is larger than the size of a pencil eraser.  Always use sunscreen. Apply sunscreen liberally and repeatedly throughout the day.  Protect yourself by wearing long sleeves, pants, a wide-brimmed hat, and sunglasses whenever you are outside.  Heart disease, diabetes, and  high blood pressure  High blood pressure causes heart disease and increases the risk of stroke. High blood pressure is more likely to develop in: ? People who have blood pressure in the high end of the normal range (130-139/85-89 mm Hg). ? People who are overweight or obese. ? People who are African American.  If you are 91-73 years of age, have your blood pressure checked every 3-5 years. If you are 47 years of age or older, have your blood pressure checked every year. You should have your blood pressure measured twice-once when you are at a hospital or clinic, and once when you are not at a hospital or clinic. Record the average of the two measurements. To check your blood pressure when you are not at a hospital or clinic, you can use: ? An automated blood pressure machine at a pharmacy. ? A home blood pressure monitor.  If you are between 81 years and 62 years old, ask your health care provider if you should take aspirin to prevent strokes.  Have regular diabetes screenings. This involves taking a blood sample to check your fasting blood sugar level. ? If you are at a normal weight and have a low risk for diabetes, have this test once every three years after 70 years of age. ? If you are overweight and have a high risk for diabetes, consider being  tested at a younger age or more often. Preventing infection Hepatitis B  If you have a higher risk for hepatitis B, you should be screened for this virus. You are considered at high risk for hepatitis B if: ? You were born in a country where hepatitis B is common. Ask your health care provider which countries are considered high risk. ? Your parents were born in a high-risk country, and you have not been immunized against hepatitis B (hepatitis B vaccine). ? You have HIV or AIDS. ? You use needles to inject street drugs. ? You live with someone who has hepatitis B. ? You have had sex with someone who has hepatitis B. ? You get hemodialysis  treatment. ? You take certain medicines for conditions, including cancer, organ transplantation, and autoimmune conditions.  Hepatitis C  Blood testing is recommended for: ? Everyone born from [birth year] through 1963. ? Anyone with known risk factors for hepatitis C.  Sexually transmitted infections (STIs)  You should be screened for sexually transmitted infections (STIs) including gonorrhea and chlamydia if: ? You are sexually active and are younger than 70 years of age. ? You are older than 70 years of age and your health care provider tells you that you are at risk for this type of infection. ? Your sexual activity has changed since you were last screened and you are at an increased risk for chlamydia or gonorrhea. Ask your health care provider if you are at risk.  If you do not have HIV, but are at risk, it may be recommended that you take a prescription medicine daily to prevent HIV infection. This is called pre-exposure prophylaxis (PrEP). You are considered at risk if: ? You are sexually active and do not regularly use condoms or know the HIV status of your partner(s). ? You take drugs by injection. ? You are sexually active with a partner who has HIV.  Talk with your health care provider about whether you are at high risk of being infected with HIV. If you choose to begin PrEP, you should first be tested for HIV. You should then be tested every 3 months for as long as you are taking PrEP. Pregnancy  If you are premenopausal and you may become pregnant, ask your health care provider about preconception counseling.  If you may become pregnant, take 400 to 800 micrograms (mcg) of folic acid every day.  If you want to prevent pregnancy, talk to your health care provider about birth control (contraception). Osteoporosis and menopause  Osteoporosis is a disease in which the bones lose minerals and strength with aging. This can result in serious bone fractures. Your risk for osteoporosis  can be identified using a bone density scan.  If you are 105 years of age or older, or if you are at risk for osteoporosis and fractures, ask your health care provider if you should be screened.  Ask your health care provider whether you should take a calcium or vitamin D supplement to lower your risk for osteoporosis.  Menopause may have certain physical symptoms and risks.  Hormone replacement therapy may reduce some of these symptoms and risks. Talk to your health care provider about whether hormone replacement therapy is right for you. Follow these instructions at home:  Schedule regular health, dental, and eye exams.  Stay current with your immunizations.  Do not use any tobacco products including cigarettes, chewing tobacco, or electronic cigarettes.  If you are pregnant, do not drink alcohol.  If you are  breastfeeding, limit how much and how often you drink alcohol.  Limit alcohol intake to no more than 1 drink per day for nonpregnant women. One drink equals 12 ounces of beer, 5 ounces of wine, or 1 ounces of hard liquor.  Do not use street drugs.  Do not share needles.  Ask your health care provider for help if you need support or information about quitting drugs.  Tell your health care provider if you often feel depressed.  Tell your health care provider if you have ever been abused or do not feel safe at home. This information is not intended to replace advice given to you by your health care provider. Make sure you discuss any questions you have with your health care provider. Document Released: 10/11/2010 Document Revised: 09/03/2015 Document Reviewed: 12/30/2014 Elsevier Interactive Patient Education  2018 [place]. Trannie Bardales M.D.

## 2017-12-21 ENCOUNTER — Ambulatory Visit: Payer: 59 | Admitting: Internal Medicine

## 2017-12-21 ENCOUNTER — Encounter: Payer: Self-pay | Admitting: Internal Medicine

## 2017-12-21 VITALS — BP 124/70 | HR 60 | Temp 98.1°F | Wt 122.8 lb

## 2017-12-21 DIAGNOSIS — E785 Hyperlipidemia, unspecified: Secondary | ICD-10-CM

## 2017-12-21 DIAGNOSIS — Z0184 Encounter for antibody response examination: Secondary | ICD-10-CM

## 2017-12-21 DIAGNOSIS — Z23 Encounter for immunization: Secondary | ICD-10-CM

## 2017-12-21 DIAGNOSIS — Z0000 Encounter for general adult medical examination without abnormal findings: Secondary | ICD-10-CM

## 2017-12-21 DIAGNOSIS — E039 Hypothyroidism, unspecified: Secondary | ICD-10-CM | POA: Diagnosis not present

## 2017-12-21 DIAGNOSIS — I10 Essential (primary) hypertension: Secondary | ICD-10-CM

## 2017-12-21 DIAGNOSIS — E119 Type 2 diabetes mellitus without complications: Secondary | ICD-10-CM | POA: Diagnosis not present

## 2017-12-21 DIAGNOSIS — Z79899 Other long term (current) drug therapy: Secondary | ICD-10-CM | POA: Diagnosis not present

## 2017-12-21 DIAGNOSIS — R0981 Nasal congestion: Secondary | ICD-10-CM

## 2017-12-21 NOTE — Patient Instructions (Signed)
Glad you are  Doing well Continue lifestyle intervention healthy eating and exercise .  Will notify you  of labs when available.  Use flonase 2 sprays in each nostril 2 x per day  To suppress   Sinus nasal congestion.    Health Maintenance, Female Adopting a healthy lifestyle and getting preventive care can go a long way to promote health and wellness. Talk with your health care provider about what schedule of regular examinations is right for you. This is a good chance for you to check in with your provider about disease prevention and staying healthy. In between checkups, there are plenty of things you can do on your own. Experts have done a lot of research about which lifestyle changes and preventive measures are most likely to keep you healthy. Ask your health care provider for more information. Weight and diet Eat a healthy diet  Be sure to include plenty of vegetables, fruits, low-fat dairy products, and lean protein.  Do not eat a lot of foods high in solid fats, added sugars, or salt.  Get regular exercise. This is one of the most important things you can do for your health. ? Most adults should exercise for at least 150 minutes each week. The exercise should increase your heart rate and make you sweat (moderate-intensity exercise). ? Most adults should also do strengthening exercises at least twice a week. This is in addition to the moderate-intensity exercise.  Maintain a healthy weight  Body mass index (BMI) is a measurement that can be used to identify possible weight problems. It estimates body fat based on height and weight. Your health care provider can help determine your BMI and help you achieve or maintain a healthy weight.  For females 36 years of age and older: ? A BMI below 18.5 is considered underweight. ? A BMI of 18.5 to 24.9 is normal. ? A BMI of 25 to 29.9 is considered overweight. ? A BMI of 30 and above is considered obese.  Watch levels of cholesterol and  blood lipids  You should start having your blood tested for lipids and cholesterol at 70 years of age, then have this test every 5 years.  You may need to have your cholesterol levels checked more often if: ? Your lipid or cholesterol levels are high. ? You are older than 70 years of age. ? You are at high risk for heart disease.  Cancer screening Lung Cancer  Lung cancer screening is recommended for adults 34-52 years old who are at high risk for lung cancer because of a history of smoking.  A yearly low-dose CT scan of the lungs is recommended for people who: ? Currently smoke. ? Have quit within the past 15 years. ? Have at least a 30-pack-year history of smoking. A pack year is smoking an average of one pack of cigarettes a day for 1 year.  Yearly screening should continue until it has been 15 years since you quit.  Yearly screening should stop if you develop a health problem that would prevent you from having lung cancer treatment.  Breast Cancer  Practice breast self-awareness. This means understanding how your breasts normally appear and feel.  It also means doing regular breast self-exams. Let your health care provider know about any changes, no matter how small.  If you are in your 20s or 30s, you should have a clinical breast exam (CBE) by a health care provider every 1-3 years as part of a regular health exam.  exam.  If you are 40 or older, have a CBE every year. Also consider having a breast X-ray (mammogram) every year.  If you have a family history of breast cancer, talk to your health care provider about genetic screening.  If you are at high risk for breast cancer, talk to your health care provider about having an MRI and a mammogram every year.  Breast cancer gene (BRCA) assessment is recommended for women who have family members with BRCA-related cancers. BRCA-related cancers include: ? Breast. ? Ovarian. ? Tubal. ? Peritoneal cancers.  Results of the assessment  will determine the need for genetic counseling and BRCA1 and BRCA2 testing.  Cervical Cancer Your health care provider may recommend that you be screened regularly for cancer of the pelvic organs (ovaries, uterus, and vagina). This screening involves a pelvic examination, including checking for microscopic changes to the surface of your cervix (Pap test). You may be encouraged to have this screening done every 3 years, beginning at age 21.  For women ages 30-65, health care providers may recommend pelvic exams and Pap testing every 3 years, or they may recommend the Pap and pelvic exam, combined with testing for human papilloma virus (HPV), every 5 years. Some types of HPV increase your risk of cervical cancer. Testing for HPV may also be done on women of any age with unclear Pap test results.  Other health care providers may not recommend any screening for nonpregnant women who are considered low risk for pelvic cancer and who do not have symptoms. Ask your health care provider if a screening pelvic exam is right for you.  If you have had past treatment for cervical cancer or a condition that could lead to cancer, you need Pap tests and screening for cancer for at least 20 years after your treatment. If Pap tests have been discontinued, your risk factors (such as having a new sexual partner) need to be reassessed to determine if screening should resume. Some women have medical problems that increase the chance of getting cervical cancer. In these cases, your health care provider may recommend more frequent screening and Pap tests.  Colorectal Cancer  This type of cancer can be detected and often prevented.  Routine colorectal cancer screening usually begins at 70 years of age and continues through 70 years of age.  Your health care provider may recommend screening at an earlier age if you have risk factors for colon cancer.  Your health care provider may also recommend using home test kits to  check for hidden blood in the stool.  A small camera at the end of a tube can be used to examine your colon directly (sigmoidoscopy or colonoscopy). This is done to check for the earliest forms of colorectal cancer.  Routine screening usually begins at age 50.  Direct examination of the colon should be repeated every 5-10 years through 70 years of age. However, you may need to be screened more often if early forms of precancerous polyps or small growths are found.  Skin Cancer  Check your skin from head to toe regularly.  Tell your health care provider about any new moles or changes in moles, especially if there is a change in a mole's shape or color.  Also tell your health care provider if you have a mole that is larger than the size of a pencil eraser.  Always use sunscreen. Apply sunscreen liberally and repeatedly throughout the day.  Protect yourself by wearing long sleeves, pants, a wide-brimmed   sunglasses whenever you are outside.  Heart disease, diabetes, and high blood pressure  High blood pressure causes heart disease and increases the risk of stroke. High blood pressure is more likely to develop in: ? People who have blood pressure in the high end of the normal range (130-139/85-89 mm Hg). ? People who are overweight or obese. ? People who are African American.  If you are 67-53 years of age, have your blood pressure checked every 3-5 years. If you are 45 years of age or older, have your blood pressure checked every year. You should have your blood pressure measured twice-once when you are at a hospital or clinic, and once when you are not at a hospital or clinic. Record the average of the two measurements. To check your blood pressure when you are not at a hospital or clinic, you can use: ? An automated blood pressure machine at a pharmacy. ? A home blood pressure monitor.  If you are between 47 years and 65 years old, ask your health care provider if you should  take aspirin to prevent strokes.  Have regular diabetes screenings. This involves taking a blood sample to check your fasting blood sugar level. ? If you are at a normal weight and have a low risk for diabetes, have this test once every three years after 70 years of age. ? If you are overweight and have a high risk for diabetes, consider being tested at a younger age or more often. Preventing infection Hepatitis B  If you have a higher risk for hepatitis B, you should be screened for this virus. You are considered at high risk for hepatitis B if: ? You were born in a country where hepatitis B is common. Ask your health care provider which countries are considered high risk. ? Your parents were born in a high-risk country, and you have not been immunized against hepatitis B (hepatitis B vaccine). ? You have HIV or AIDS. ? You use needles to inject street drugs. ? You live with someone who has hepatitis B. ? You have had sex with someone who has hepatitis B. ? You get hemodialysis treatment. ? You take certain medicines for conditions, including cancer, organ transplantation, and autoimmune conditions.  Hepatitis C  Blood testing is recommended for: ? Everyone born from [birth year] through 1963. ? Anyone with known risk factors for hepatitis C.  Sexually transmitted infections (STIs)  You should be screened for sexually transmitted infections (STIs) including gonorrhea and chlamydia if: ? You are sexually active and are younger than 70 years of age. ? You are older than 70 years of age and your health care provider tells you that you are at risk for this type of infection. ? Your sexual activity has changed since you were last screened and you are at an increased risk for chlamydia or gonorrhea. Ask your health care provider if you are at risk.  If you do not have HIV, but are at risk, it may be recommended that you take a prescription medicine daily to prevent HIV infection. This is called  pre-exposure prophylaxis (PrEP). You are considered at risk if: ? You are sexually active and do not regularly use condoms or know the HIV status of your partner(s). ? You take drugs by injection. ? You are sexually active with a partner who has HIV.  Talk with your health care provider about whether you are at high risk of being infected with HIV. If you choose to begin PrEP, you  should first be tested for HIV. You should then be tested every 3 months for as long as you are taking PrEP. Pregnancy  If you are premenopausal and you may become pregnant, ask your health care provider about preconception counseling.  If you may become pregnant, take 400 to 800 micrograms (mcg) of folic acid every day.  If you want to prevent pregnancy, talk to your health care provider about birth control (contraception). Osteoporosis and menopause  Osteoporosis is a disease in which the bones lose minerals and strength with aging. This can result in serious bone fractures. Your risk for osteoporosis can be identified using a bone density scan.  If you are 7 years of age or older, or if you are at risk for osteoporosis and fractures, ask your health care provider if you should be screened.  Ask your health care provider whether you should take a calcium or vitamin D supplement to lower your risk for osteoporosis.  Menopause may have certain physical symptoms and risks.  Hormone replacement therapy may reduce some of these symptoms and risks. Talk to your health care provider about whether hormone replacement therapy is right for you. Follow these instructions at home:  Schedule regular health, dental, and eye exams.  Stay current with your immunizations.  Do not use any tobacco products including cigarettes, chewing tobacco, or electronic cigarettes.  If you are pregnant, do not drink alcohol.  If you are breastfeeding, limit how much and how often you drink alcohol.  Limit alcohol intake to no more  than 1 drink per day for nonpregnant women. One drink equals 12 ounces of beer, 5 ounces of wine, or 1 ounces of hard liquor.  Do not use street drugs.  Do not share needles.  Ask your health care provider for help if you need support or information about quitting drugs.  Tell your health care provider if you often feel depressed.  Tell your health care provider if you have ever been abused or do not feel safe at home. This information is not intended to replace advice given to you by your health care provider. Make sure you discuss any questions you have with your health care provider. Document Released: 10/11/2010 Document Revised: 09/03/2015 Document Reviewed: 12/30/2014 Elsevier Interactive Patient Education  [employer].

## 2017-12-22 DIAGNOSIS — Z0184 Encounter for antibody response examination: Secondary | ICD-10-CM | POA: Diagnosis not present

## 2017-12-22 DIAGNOSIS — Z0000 Encounter for general adult medical examination without abnormal findings: Secondary | ICD-10-CM | POA: Diagnosis not present

## 2017-12-22 LAB — MICROALBUMIN / CREATININE URINE RATIO
CREATININE, U: 92.2 mg/dL
Microalb Creat Ratio: 0.8 mg/g (ref 0.0–30.0)
Microalb, Ur: <0.7 mg/dL (ref 0.0–1.9)

## 2017-12-22 LAB — LIPID PANEL
CHOL/HDL RATIO: 2
Cholesterol: 127 mg/dL (ref 0–200)
HDL: 56.1 mg/dL (ref >39.00)
LDL CALC: 41 mg/dL (ref 0–99)
NONHDL: 70.47
TRIGLYCERIDES: 148 mg/dL (ref 0.0–149.0)
VLDL: 29.6 mg/dL (ref 0.0–40.0)

## 2017-12-22 LAB — HEPATIC FUNCTION PANEL
ALBUMIN: 4.2 g/dL (ref 3.5–5.2)
ALT: 18 U/L (ref 0–35)
AST: 15 U/L (ref 0–37)
Alkaline Phosphatase: 48 U/L (ref 39–117)
Bilirubin, Direct: 0.1 mg/dL (ref 0.0–0.3)
TOTAL PROTEIN: 6.4 g/dL (ref 6.0–8.3)
Total Bilirubin: 0.6 mg/dL (ref 0.2–1.2)

## 2017-12-22 LAB — BASIC METABOLIC PANEL
BUN: 11 mg/dL (ref 6–23)
CALCIUM: 9.6 mg/dL (ref 8.4–10.5)
CHLORIDE: 106 meq/L (ref 96–112)
CO2: 27 mEq/L (ref 19–32)
Creatinine, Ser: 0.66 mg/dL (ref 0.40–1.20)
GFR: 93.96 mL/min (ref >60.00)
Glucose, Bld: 108 mg/dL — ABNORMAL HIGH (ref 70–99)
Potassium: 4.4 mEq/L (ref 3.5–5.1)
SODIUM: 142 meq/L (ref 135–145)

## 2017-12-22 LAB — CBC WITH DIFFERENTIAL/PLATELET
Basophils Absolute: 0 10*3/uL (ref 0.0–0.1)
Basophils Relative: 0.7 % (ref 0.0–3.0)
EOS ABS: 0.4 10*3/uL (ref 0.0–0.7)
Eosinophils Relative: 6.5 % — ABNORMAL HIGH (ref 0.0–5.0)
HCT: 43 % (ref 36.0–46.0)
Hemoglobin: 14.5 g/dL (ref 12.0–15.0)
LYMPHS PCT: 30.7 % (ref 12.0–46.0)
Lymphs Abs: 2 10*3/uL (ref 0.7–4.0)
MCHC: 33.8 g/dL (ref 30.0–36.0)
MCV: 89.4 fl (ref 78.0–100.0)
MONOS PCT: 5.3 % (ref 3.0–12.0)
Monocytes Absolute: 0.4 10*3/uL (ref 0.1–1.0)
NEUTROS PCT: 56.8 % (ref 43.0–77.0)
Neutro Abs: 3.8 10*3/uL (ref 1.4–7.7)
PLATELETS: 219 10*3/uL (ref 150.0–400.0)
RBC: 4.81 Mil/uL (ref 3.87–5.11)
RDW: 13.4 % (ref 11.5–15.5)
WBC: 6.7 10*3/uL (ref 4.0–10.5)

## 2017-12-22 LAB — TSH: TSH: 0.72 u[IU]/mL (ref 0.35–4.50)

## 2017-12-22 LAB — HEMOGLOBIN A1C: Hgb A1c MFr Bld: 6.8 % — ABNORMAL HIGH (ref 4.6–6.5)

## 2017-12-23 LAB — HEPATITIS B SURFACE ANTIGEN: Hepatitis B Surface Ag: NONREACTIVE

## 2017-12-23 LAB — HEPATITIS B SURFACE ANTIBODY,QUALITATIVE: HEP B S AB: NONREACTIVE

## 2017-12-28 NOTE — Telephone Encounter (Signed)
Please advise Dr Panosh, thanks.   

## 2017-12-29 NOTE — Telephone Encounter (Signed)
Please advise Dr Panosh, thanks.   

## 2017-12-29 NOTE — Telephone Encounter (Signed)
Not sure what   referring to   Colon cancer screening?  And follow up as per your Gi specialist advises.   Colonscopy is the  Standard for colon screening  But cologuard is also ok .  Our records showed colonoscopy in 2016 where thorough exam is  done.  Let us know if that answers your question.

## 2018-01-11 ENCOUNTER — Other Ambulatory Visit: Payer: Self-pay | Admitting: Internal Medicine

## 2018-02-08 ENCOUNTER — Encounter: Payer: Self-pay | Admitting: Internal Medicine

## 2018-02-19 ENCOUNTER — Other Ambulatory Visit: Payer: Self-pay | Admitting: Internal Medicine

## 2018-06-19 NOTE — Progress Notes (Signed)
Chief Complaint  Patient presents with  . Follow-up    Pt is here for a follow up on her allergies and states it has gotten better since last visit pt also has a form to be filled out for a job application     HPI: Debbie Marshall 73 y.o. 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 will  be retiring  Here and and work closer to her family father 20 in [place].  No new sx  But the sinus problem is better after rx but then recurring  Dec smell at times  Only using flonase when acts up.   ent   Gave new nose psray but didn't use cause was better No fever cough ,   hasnt checked bg recently   Bp has been ok    ROS: See pertinent positives and negatives per HPI. No cp sob fever numbness  Past Medical History:  Diagnosis Date  . ABNORMAL EXAM-BILIARY TRACT 11/14/2008  . ABNORMAL TRANSAMINASE, (LFT'S) 11/14/2008    2005 US shows fatty liver  . Cancer ([place]) 1996   tongue cancer  . Diabetes mellitus without complication ([place])   . FASTING HYPERGLYCEMIA 09/07/2006  . GERD 09/07/2006  . HYPERLIPIDEMIA 09/07/2006  . HYPOTHYROIDISM 10/30/2006  . Nonspecific abnormal results of liver function study 09/07/2006  . Normal nuclear stress test 2005   stress cardiolite   . OSTEOARTHRITIS 09/07/2006  . Positive PPD    received vaccine in home country- shows positive after receiving  . 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 VITAMIN D DEFICIENCY 08/14/2009    Family History  Problem Relation Age of Onset  . Stroke Mother 71  . Arthritis Mother   . Rheum arthritis Brother   . Arthritis Brother   . Gout Brother   . Colon cancer Other   . Esophageal cancer Neg Hx   . Stomach cancer Neg Hx   . Rectal cancer Neg Hx     Social History   Socioeconomic History  . Marital status: Single    Spouse name: Not on file  . Number of children: Not on file  . Years of education: Not on file  . Highest education  level: Not on file  Occupational History  . Occupation: PhD Professor  Social Needs  . Financial resource strain: Not on file  . Food insecurity:    Worry: Not on file    Inability: Not on file  . Transportation needs:    Medical: Not on file    Non-medical: Not on file  Tobacco Use  . Smoking status: Never Smoker  . Smokeless tobacco: Never Used  Substance and Sexual Activity  . Alcohol use: Yes    Alcohol/week: 1.0 standard drinks    Types: 1 Glasses of wine per week    Comment: rarely  . Drug use: No  . Sexual activity: Not on file  Lifestyle  . Physical activity:    Days per week: Not on file    Minutes per session: Not on file  . Stress: Not on file  Relationships  . Social connections:    Talks on phone: Not on file    Gets together: Not on file    Attends religious service: Not on file    Active member of club or organization: Not on file    Attends meetings  of clubs or organizations: Not on file    Relationship status: Not on file  Other Topics Concern  . Not on file  Social History Narrative   HH of 2 Father and her    cat     PhD professor in information systems    No tobacco or alcohol exercising regularly currently    Outpatient Medications Prior to Visit  Medication Sig Dispense Refill  . albuterol (PROAIR HFA) 108 (90 Base) MCG/ACT inhaler Inhale 2 puffs into the lungs every 6 (six) hours as needed for wheezing or shortness of breath. 1 Inhaler 1  . Cholecalciferol 1000 UNITS capsule Take 1 capsule (1,000 Units total) by mouth daily. 90 capsule 3  . fluticasone (FLONASE) 50 MCG/ACT nasal spray Place 2 sprays into both nostrils daily. 16 g 2  . glucose blood (ACCU-CHEK AVIVA PLUS) test strip Test twice daily. 100 each 12  . [employer] (ACCU-CHEK SOFTCLIX) lancets Test twice daily. 1 each 12  . metFORMIN (GLUCOPHAGE-XR) 500 MG 24 hr tablet TAKE TWO TABLETS BY MOUTH DAILY  180 tablet 0  . Omega-3 Fatty Acids (FISH OIL) 1000 MG CAPS Take by mouth.        . simvastatin (ZOCOR) 40 MG tablet TAKE ONE TABLET BY MOUTH AT BEDTIME  90 tablet 0  . SYNTHROID 75 MCG tablet TAKE 1 TABLET DAILY 90 tablet 0   No facility-administered medications prior to visit.      EXAM:  BP 138/82 (BP Location: Right Arm, Patient Position: Sitting, Cuff Size: Normal)   Pulse 62   Temp 98.6 F (37 C) (Oral)   Wt 124 lb 6.4 oz (56.4 kg)   BMI 24.30 kg/m   Body mass index is 24.3 kg/m.  GENERAL: vitals reviewed and listed above, alert, oriented, appears well hydrated and in no acute distress mild congestion HEENT: atraumatic, conjunctiva  clear, no obvious abnormalities on inspection of external nose and ears tmx clear OP : no lesion edema or exudate  NECK: no obvious masses on inspection palpation  LUNGS: clear to auscultation bilaterally, no wheezes, rales or rhonchi, good air movement CV: HRRR, no clubbing cyanosis or  peripheral edema nl cap refill  Abdomen:  Sof,t normal bowel sounds without hepatosplenomegaly, no guarding rebound or masses no CVA tenderness  MS: moves all extremities without noticeable focal  abnormality PSYCH: pleasant and cooperative, no obvious depression or anxiety Lab Results  Component Value Date   WBC 6.7 12/22/2017   HGB 14.5 12/22/2017   HCT 43.0 12/22/2017   PLT 219.0 12/22/2017   GLUCOSE 108 (H) 12/22/2017   CHOL 127 12/22/2017   TRIG 148.0 12/22/2017   HDL 56.10 12/22/2017   LDLDIRECT 163.6 10/23/2006   LDLCALC 41 12/22/2017   ALT 18 12/22/2017   AST 15 12/22/2017   NA 142 12/22/2017   K 4.4 12/22/2017   CL 106 12/22/2017   CREATININE 0.66 12/22/2017   BUN 11 12/22/2017   CO2 27 12/22/2017   TSH 0.72 12/22/2017   INR 1.0 ratio 11/14/2008   HGBA1C 6.5 (A) 06/21/2018   MICROALBUR <0.7 06/21/2018   BP Readings from Last 3 Encounters:  06/21/18 138/82  12/21/17 124/70  09/13/17 118/76    ASSESSMENT AND PLAN:  Discussed the following assessment and plan:  Type 2 diabetes mellitus without complication,  without long-term current use of insulin ([place]) - Plan: POCT glycosylated hemoglobin (Hb A1C), DG Chest 2 View, POCT Urinalysis Dipstick (Automated), Microalbumin / creatinine urine ratio, DG Chest 2 View, Basic metabolic  panel, CBC with Differential/Platelet, Hemoglobin A1c, Hepatic function panel, Lipid panel, TSH  Medication management - Plan: DG Chest 2 View, POCT Urinalysis Dipstick (Automated), Microalbumin / creatinine urine ratio, DG Chest 2 View, Basic metabolic panel, CBC with Differential/Platelet, Hemoglobin A1c, Hepatic function panel, Lipid panel, TSH  Nasal congestion - Plan: Basic metabolic panel, CBC with Differential/Platelet, Hemoglobin A1c, Hepatic function panel, Lipid panel, TSH  Positive PPD - Plan: DG Chest 2 View, DG Chest 2 View  Hyperlipidemia, unspecified hyperlipidemia type - Plan: Basic metabolic panel, CBC with Differential/Platelet, Hemoglobin A1c, Hepatic function panel, Lipid panel, TSH  Visit for preventive health examination - Plan: Basic metabolic panel, CBC with Differential/Platelet, Hemoglobin A1c, Hepatic function panel, Lipid panel, TSH a1c   Better today   Form with  hearing vision completed  And signed no reservation.   Disc  use of incs every day and or bid to  For prevention and control and smell sense should return .  Fu if not .   Her parameters have been at goal  And plan full labs and assessment before she moves  To [place].  -Patient advised to return or notify health care team  if  new concerns arise.  Hearing Screening   125Hz  250Hz  500Hz  1000Hz  2000Hz  3000Hz  4000Hz  6000Hz  8000Hz   Right ear:   Pass Pass Pass  Pass    Left ear:   Pass Pass Pass  Pass      Visual Acuity Screening   Right eye Left eye Both eyes  Without correction: 20/20 20/20 20/13   With correction: 20/13 20/15 20/13     Patient Instructions   Use the flonase every day  It is a controller prevention medication for  Allergy nasal congestion.  worsk best if taken every day  To  prevent congestion .   Smell should return if inflammation in nose is gone .  Plan cpx yearly and full lab oanel in June  Before you leave  The country .  Your hg a1c is 6.5      Mariann Laster K. Royann Wildasin M.D.

## 2018-06-21 ENCOUNTER — Encounter: Payer: Self-pay | Admitting: Internal Medicine

## 2018-06-21 ENCOUNTER — Other Ambulatory Visit: Payer: Self-pay

## 2018-06-21 ENCOUNTER — Ambulatory Visit (INDEPENDENT_AMBULATORY_CARE_PROVIDER_SITE_OTHER): Payer: 59

## 2018-06-21 ENCOUNTER — Ambulatory Visit (INDEPENDENT_AMBULATORY_CARE_PROVIDER_SITE_OTHER): Payer: 59 | Admitting: Internal Medicine

## 2018-06-21 VITALS — BP 138/82 | HR 62 | Temp 98.6°F | Wt 124.4 lb

## 2018-06-21 DIAGNOSIS — R7611 Nonspecific reaction to tuberculin skin test without active tuberculosis: Secondary | ICD-10-CM

## 2018-06-21 DIAGNOSIS — R0981 Nasal congestion: Secondary | ICD-10-CM

## 2018-06-21 DIAGNOSIS — E119 Type 2 diabetes mellitus without complications: Secondary | ICD-10-CM

## 2018-06-21 DIAGNOSIS — E785 Hyperlipidemia, unspecified: Secondary | ICD-10-CM

## 2018-06-21 DIAGNOSIS — Z79899 Other long term (current) drug therapy: Secondary | ICD-10-CM

## 2018-06-21 DIAGNOSIS — Z0000 Encounter for general adult medical examination without abnormal findings: Secondary | ICD-10-CM

## 2018-06-21 LAB — POC URINALSYSI DIPSTICK (AUTOMATED)
Bilirubin, UA: NEGATIVE
Glucose, UA: NEGATIVE
KETONES UA: NEGATIVE
Leukocytes, UA: NEGATIVE
Nitrite, UA: NEGATIVE
PROTEIN UA: NEGATIVE
RBC UA: NEGATIVE
SPEC GRAV UA: 1.015 (ref 1.010–1.025)
Urobilinogen, UA: 0.2 E.U./dL
pH, UA: 6 (ref 5.0–8.0)

## 2018-06-21 LAB — MICROALBUMIN / CREATININE URINE RATIO
Creatinine,U: 45.5 mg/dL
Microalb Creat Ratio: 1.5 mg/g (ref 0.0–30.0)
Microalb, Ur: <0.7 mg/dL (ref 0.0–1.9)

## 2018-06-21 LAB — POCT GLYCOSYLATED HEMOGLOBIN (HGB A1C): HEMOGLOBIN A1C: 6.5 % — AB (ref 4.0–5.6)

## 2018-06-21 NOTE — Patient Instructions (Addendum)
  Use the flonase every day  It is a controller prevention medication for  Allergy nasal congestion.  worsk best if taken every day  To prevent congestion .   Smell should return if inflammation in nose is gone .  Plan cpx yearly and full lab oanel in June  Before you leave  The country .  Your hg a1c is 6.5

## 2018-06-26 ENCOUNTER — Other Ambulatory Visit: Payer: Self-pay

## 2018-06-27 MED ORDER — SIMVASTATIN 40 MG PO TABS: 40.0000 mg | ORAL_TABLET | Freq: Every day | ORAL | 0 refills | Status: DC

## 2018-06-27 MED ORDER — METFORMIN HCL ER 500 MG PO TB24: 1000.0000 mg | ORAL_TABLET | Freq: Every day | ORAL | 0 refills | Status: DC

## 2018-07-02 ENCOUNTER — Ambulatory Visit (INDEPENDENT_AMBULATORY_CARE_PROVIDER_SITE_OTHER): Payer: 59 | Admitting: *Deleted

## 2018-07-02 ENCOUNTER — Other Ambulatory Visit: Payer: Self-pay

## 2018-07-02 DIAGNOSIS — Z111 Encounter for screening for respiratory tuberculosis: Secondary | ICD-10-CM | POA: Diagnosis not present

## 2018-07-02 DIAGNOSIS — R7611 Nonspecific reaction to tuberculin skin test without active tuberculosis: Secondary | ICD-10-CM

## 2018-07-02 NOTE — Progress Notes (Signed)
Per orders of Dr. Sarajane Jews in Dr Velora Mediate absence, injection of PPD placement given by Virl Cagey.  Tuberculin skin test applied to LEFT ventral forearm.    Pt had recent CXR (pt stated required by her employer because of a possible positive PPD in the past). Pt is aware to come back in 48-72hrs for her PPD to be read -- this will take place in her car. Pt aware to call when she arrives and a nurse will come out to read her PPD.    Patient tolerated injection well.

## 2018-07-04 ENCOUNTER — Other Ambulatory Visit (INDEPENDENT_AMBULATORY_CARE_PROVIDER_SITE_OTHER): Payer: 59

## 2018-07-04 ENCOUNTER — Other Ambulatory Visit: Payer: Self-pay

## 2018-07-04 DIAGNOSIS — Z111 Encounter for screening for respiratory tuberculosis: Secondary | ICD-10-CM | POA: Diagnosis not present

## 2018-07-04 DIAGNOSIS — R7611 Nonspecific reaction to tuberculin skin test without active tuberculosis: Secondary | ICD-10-CM | POA: Diagnosis not present

## 2018-07-04 NOTE — Progress Notes (Signed)
PPD Reading Note PPD read and results entered in [place]. Result: 54mm induration. Interpretation: waiting on Quant Gold results - questionable. If test not read within 48-72 hours of initial placement, patient advised to repeat in other arm 1-3 weeks after this test. Allergic reaction: no  Pt advised to use Hydrocortisone cream on inflamed sight.   Discussed with Dorothyann Peng, NP - to follow through should have Quantiferon Gold blood work today.   Unable to read/measure appropriately due to not having a PPD induration ruler. Recent cxr 06/29/2018 was normal. Will wait for Quant Gold results to determine next step in plan in care.

## 2018-07-04 NOTE — Addendum Note (Signed)
Addended by: Virl Cagey on: 07/04/2018 09:13 AM   Modules accepted: Orders

## 2018-07-06 LAB — QUANTIFERON-TB GOLD PLUS
Mitogen-NIL: >10 IU/mL
NIL: 0.05 IU/mL
QuantiFERON-TB Gold Plus: NEGATIVE
TB1-NIL: 0.09 IU/mL
TB2-NIL: 0.19 IU/mL

## 2018-07-13 ENCOUNTER — Other Ambulatory Visit: Payer: Self-pay | Admitting: Internal Medicine

## 2018-09-21 ENCOUNTER — Encounter: Payer: 59 | Admitting: Internal Medicine

## 2019-01-10 ENCOUNTER — Other Ambulatory Visit: Payer: Self-pay | Admitting: Internal Medicine

## 2019-01-10 DIAGNOSIS — Z1231 Encounter for screening mammogram for malignant neoplasm of breast: Secondary | ICD-10-CM

## 2019-01-23 ENCOUNTER — Ambulatory Visit: Payer: 59

## 2019-01-23 ENCOUNTER — Ambulatory Visit
Admission: RE | Admit: 2019-01-23 | Discharge: 2019-01-23 | Disposition: A | Discharge status: Home / Self Care | Payer: Medicare Other | Source: Ambulatory Visit | Attending: Internal Medicine | Admitting: Internal Medicine

## 2019-01-23 ENCOUNTER — Other Ambulatory Visit: Payer: Self-pay

## 2019-01-23 DIAGNOSIS — Z1231 Encounter for screening mammogram for malignant neoplasm of breast: Secondary | ICD-10-CM

## 2019-01-23 NOTE — Progress Notes (Signed)
Chief Complaint  Patient presents with  . Annual Exam    Pt has concerns about joint pain     HPI: Debbie Marshall 73 y.o. comes in today for Preventive Medicare exam/ wellness visit . 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 Does some light  ub weights      Loss of smell on one side again comes and goes  Had hs of polyps previously     thyroid no chagnes  Health Maintenance  Topic Date Due  . OPHTHALMOLOGY EXAM  11/19/2015  . COLONOSCOPY  01/12/2018  . INFLUENZA VACCINE  11/10/2018  . FOOT EXAM  12/22/2018  . HEMOGLOBIN A1C  12/22/2018  . URINE MICROALBUMIN  06/21/2019  . MAMMOGRAM  01/22/2021  . TETANUS/TDAP  06/13/2023  . DEXA SCAN  Completed  . Hepatitis C Screening  Completed  . PNA vac Low Risk Adult  Completed   Health Maintenance Review LIFESTYLE:  Exercise:   Yes   Tobacco/ETS: no Alcohol:   no Sugar beverages: no  Sleep:  5-7 hours  Drug use: no HH: 2      Hearing:  ok  Vision:  No limitations at present . Last eye check UTD  Safety:  Has smoke detector and wears seat belts.   No excess sun exposure. Sees dentist regularly.  Falls:  no  Memory: Felt to be good  , no concern from her or her family.  Depression: No anhedonia unusual crying or depressive symptoms  Nutrition: Eats well balanced diet; adequate calcium and vitamin D. No swallowing chewing problems.  Injury: no major injuries in the last six months.  Other healthcare providers:  Reviewed today .  Preventive parameters: up-to-date  Reviewed   ADLS:   There are no problems or need for assistance  driving,  feeding, obtaining food, dressing, toileting and bathing, managing money using phone. She is independent.   ROS:  Right lateral foopt numbness .  GEN/ HEENT: No fever, significant weight changes sweats headaches vision problems hearing changes, CV/ PULM; No chest pain shortness of breath cough, syncope,edema  change in exercise tolerance. GI /GU: No adominal pain, vomiting, change in bowel habits. No blood in the stool. No significant GU symptoms. SKIN/HEME: ,no acute skin rashes suspicious lesions or bleeding. No lymphadenopathy, nodules, masses.  NEURO/ PSYCH:  No neurologic signs such as weakness numbness. No depression anxiety. IMM/ Allergy: No unusual infections.  Allergy .   REST of 12 system review negative except as per HPI   Past Medical History:  Diagnosis Date  . ABNORMAL EXAM-BILIARY TRACT 11/14/2008  . ABNORMAL TRANSAMINASE, (LFT'S) 11/14/2008    2005 US shows fatty liver  . Cancer ([place]) 1996   tongue cancer  . Diabetes mellitus without complication ([place])   . FASTING HYPERGLYCEMIA 09/07/2006  . GERD 09/07/2006  . HYPERLIPIDEMIA 09/07/2006  . HYPOTHYROIDISM 10/30/2006  . Nonspecific abnormal results of liver function study 09/07/2006  . Normal nuclear stress test 2005   stress cardiolite   . OSTEOARTHRITIS 09/07/2006  .  Positive PPD    received vaccine in home country- shows positive after receiving  . 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 VITAMIN D DEFICIENCY 08/14/2009    Family History  Problem Relation Age of Onset  . Stroke Mother 71  . Arthritis Mother   . Rheum arthritis Brother   . Arthritis Brother   . Gout Brother   . Colon cancer Other   . Esophageal cancer Neg Hx   . Stomach cancer Neg Hx   . Rectal cancer Neg Hx     Social History   Socioeconomic History  . Marital status: Single    Spouse name: Not on file  . Number of children: Not on file  . Years of education: Not on file  . Highest education level: Not on file   Occupational History  . Occupation: PhD Professor  Social Needs  . Financial resource strain: Not on file  . Food insecurity    Worry: Not on file    Inability: Not on file  . Transportation needs    Medical: Not on file    Non-medical: Not on file  Tobacco Use  . Smoking status: Never Smoker  . Smokeless tobacco: Never Used  Substance and Sexual Activity  . Alcohol use: Yes    Alcohol/week: 1.0 standard drinks    Types: 1 Glasses of wine per week    Comment: rarely  . Drug use: No  . Sexual activity: Not on file  Lifestyle  . Physical activity    Days per week: Not on file    Minutes per session: Not on file  . Stress: Not on file  Relationships  . Social Herbalist on phone: Not on file    Gets together: Not on file    Attends religious service: Not on file    Active member of club or organization: Not on file    Attends meetings of clubs or organizations: Not on file    Relationship status: Not on file  Other Topics Concern  . Not on file  Social History Narrative   HH of 2 Father and her    cat     PhD professor in information systems    No tobacco or alcohol exercising regularly currently    Outpatient Encounter Medications as of 01/25/2019  Medication Sig  . Cholecalciferol 1000 UNITS capsule Take 1 capsule (1,000 Units total) by mouth daily.  . fluticasone (FLONASE) 50 MCG/ACT nasal spray Place 2 sprays into both nostrils daily.  Marland Kitchen glucose blood (ACCU-CHEK AVIVA PLUS) test strip Test twice daily.  Elmore [employer] (ACCU-CHEK SOFTCLIX) lancets Test twice daily.  . metFORMIN (GLUCOPHAGE-XR) 500 MG 24 hr tablet Take 2 tablets (1,000 mg total) by mouth daily.  . Omega-3 Fatty Acids (FISH OIL) 1000 MG CAPS Take by mouth.    . simvastatin (ZOCOR) 40 MG tablet Take 1 tablet (40 mg total) by mouth at bedtime.  Marland Kitchen SYNTHROID 75 MCG tablet TAKE 1 TABLET DAILY  . [DISCONTINUED] albuterol (PROAIR HFA) 108 (90 Base) MCG/ACT inhaler Inhale 2 puffs into the lungs  every 6 (six) hours as needed for wheezing or shortness of breath. (Patient not taking: Reported on 01/25/2019)   No facility-administered encounter medications on file as of 01/25/2019.     EXAM:  BP 138/80 (BP Location: Right Arm, Patient Position: Sitting, Cuff Size: Normal)   Pulse 65   Temp (!) 97.5 F (36.4  C) (Temporal)   Ht 4' 11.5" (1.511 m)   Wt 120 lb 6.4 oz (54.6 kg)   SpO2 98%   BMI 23.91 kg/m   Body mass index is 23.91 kg/m.  Physical Exam: Vital signs reviewed [id number] is a well-developed well-nourished alert cooperative   who appears stated age in no acute distress.  HEENT: normocephalic atraumatic , Eyes: PERRL EOM's full, conjunctiva clear, Nares:ne , Ears: no deformity EAC's clear TMs with normal landmarks. Mouth: clear OP,masked NECK: supple without masses, thyromegaly or bruits. CHEST/PULM:  Clear to auscultation and percussion breath sounds equal no wheeze , rales or rhonchi. No chest wall deformities or tenderness. CV: PMI is nondisplaced, S1 S2 no gallops, murmurs, rubs. Peripheral pulses are full without delay.No JVD . Breast: normal by inspection . No dimpling, discharge, masses, tenderness or discharge .  ABDOMEN: Bowel sounds normal nontender  No guard or rebound, no hepato splenomegal no CVA tenderness.   Extremtities:  No clubbing cyanosis or edema, no acute joint swelling or redness no focal atrophy righ knee crepitus  No instabi;ity shoulder  Nl rom  NEURO:  Oriented x3, cranial nerves 3-12 appear to be intact, no obvious focal weakness,gait within normal limits no abnormal reflexes or asymmetrical SKIN: No acute rashes normal turgor, color, no bruising or petechiae. PSYCH: Oriented, good eye contact, no obvious depression anxiety, cognition and judgment appear normal. LN: no cervical axillary inguinal adenopathy No noted deficits in memory, attention, and speech.  Diabetic Foot Exam - Simple   Simple Foot Form Diabetic Foot exam was performed with  the following findings: Yes 01/25/2019 10:41 AM  Visual Inspection No deformities, no ulcerations, no other skin breakdown bilaterally: Yes Sensation Testing Intact to touch and monofilament testing bilaterally: Yes Pulse Check Posterior Tibialis and Dorsalis pulse intact bilaterally: Yes Comments     Lab Results  Component Value Date   WBC 5.3 01/25/2019   HGB 13.8 01/25/2019   HCT 41.9 01/25/2019   PLT 200.0 01/25/2019   GLUCOSE 100 (H) 01/25/2019   CHOL 124 01/25/2019   TRIG 113.0 01/25/2019   HDL 48.80 01/25/2019   LDLDIRECT 163.6 10/23/2006   LDLCALC 52 01/25/2019   ALT 16 01/25/2019   AST 18 01/25/2019   NA 142 01/25/2019   K 4.1 01/25/2019   CL 106 01/25/2019   CREATININE 0.66 01/25/2019   BUN 12 01/25/2019   CO2 28 01/25/2019   TSH 0.08 (L) 01/25/2019   INR 1.0 ratio 11/14/2008   HGBA1C 6.6 (H) 01/25/2019   MICROALBUR <0.7 06/21/2018   Fasting.    Water.  ASSESSMENT AND PLAN:  Discussed the following assessment and plan:  Visit for preventive health examination  Medication management - Plan: Basic metabolic panel, CBC with Differential/Platelet, Hemoglobin A1c, Hepatic function panel, Lipid panel, TSH  Hyperlipidemia, unspecified hyperlipidemia type - Plan: Basic metabolic panel, CBC with Differential/Platelet, Hemoglobin A1c, Hepatic function panel, Lipid panel, TSH  Essential hypertension - Plan: Basic metabolic panel, CBC with Differential/Platelet, Hemoglobin A1c, Hepatic function panel, Lipid panel, TSH  Hypothyroidism, unspecified type - Plan: Basic metabolic panel, CBC with Differential/Platelet, Hemoglobin A1c, Hepatic function panel, Lipid panel, TSH  Type 2 diabetes mellitus without complication, without long-term current use of insulin (HCC) - Plan: Basic metabolic panel, CBC with Differential/Platelet, Hemoglobin A1c, Hepatic function panel, Lipid panel, TSH  Need for immunization against influenza - Plan: Flu Vaccine QUAD High Dose(Fluad)   Left medial knee pain - Plan: Basic metabolic panel, CBC with Differential/Platelet, Hemoglobin A1c, Hepatic function panel, Lipid  panel, TSH Plans on colonoscopy when    As is due?  Disc  Patient Care Team: Burnis Medin, MD as PCP - General  Patient Instructions   Can check knee rehab exercise, to continue  Ice  After  otc voltaren gel over joint  .   Will notify you  of labs when available.  Get your eye exam  Get  Your colon cancer screening. Continue lifestyle intervention healthy eating and exercise .   Advise see  ENT   If persistent loss of smell again . Could be recurrence of polyps .    If you change to generic thyroid then check tsh in 3-6 months to ensure still in range     Health Maintenance, Female Adopting a healthy lifestyle and getting preventive care are important in promoting health and wellness. Ask your health care provider about:  The right schedule for you to have regular tests and exams.  Things you can do on your own to prevent diseases and keep yourself healthy. What should I know about diet, weight, and exercise? Eat a healthy diet   Eat a diet that includes plenty of vegetables, fruits, low-fat dairy products, and lean protein.  Do not eat a lot of foods that are high in solid fats, added sugars, or sodium. Maintain a healthy weight Body mass index (BMI) is used to identify weight problems. It estimates body fat based on height and weight. Your health care provider can help determine your BMI and help you achieve or maintain a healthy weight. Get regular exercise Get regular exercise. This is one of the most important things you can do for your health. Most adults should:  Exercise for at least 150 minutes each week. The exercise should increase your heart rate and make you sweat (moderate-intensity exercise).  Do strengthening exercises at least twice a week. This is in addition to the moderate-intensity exercise.  Spend less time sitting.  Even light physical activity can be beneficial. Watch cholesterol and blood lipids Have your blood tested for lipids and cholesterol at 71 years of age, then have this test every 5 years. Have your cholesterol levels checked more often if:  Your lipid or cholesterol levels are high.  You are older than 71 years of age.  You are at high risk for heart disease. What should I know about cancer screening? Depending on your health history and family history, you may need to have cancer screening at various ages. This may include screening for:  Breast cancer.  Cervical cancer.  Colorectal cancer.  Skin cancer.  Lung cancer. What should I know about heart disease, diabetes, and high blood pressure? Blood pressure and heart disease  High blood pressure causes heart disease and increases the risk of stroke. This is more likely to develop in people who have high blood pressure readings, are of African descent, or are overweight.  Have your blood pressure checked: ? Every 3-5 years if you are 40-76 years of age. ? Every year if you are 2 years old or older. Diabetes Have regular diabetes screenings. This checks your fasting blood sugar level. Have the screening done:  Once every three years after age 44 if you are at a normal weight and have a low risk for diabetes.  More often and at a younger age if you are overweight or have a high risk for diabetes. What should I know about preventing infection? Hepatitis B If you have a higher risk for hepatitis B, you should  be screened for this virus. Talk with your health care provider to find out if you are at risk for hepatitis B infection. Hepatitis C Testing is recommended for:  Everyone born from [birth year] through 1963.  Anyone with known risk factors for hepatitis C. Sexually transmitted infections (STIs)  Get screened for STIs, including gonorrhea and chlamydia, if: ? You are sexually active and are younger than 71 years of age. ?  You are older than 71 years of age and your health care provider tells you that you are at risk for this type of infection. ? Your sexual activity has changed since you were last screened, and you are at increased risk for chlamydia or gonorrhea. Ask your health care provider if you are at risk.  Ask your health care provider about whether you are at high risk for HIV. Your health care provider may recommend a prescription medicine to help prevent HIV infection. If you choose to take medicine to prevent HIV, you should first get tested for HIV. You should then be tested every 3 months for as long as you are taking the medicine. Pregnancy  If you are about to stop having your period (premenopausal) and you may become pregnant, seek counseling before you get pregnant.  Take 400 to 800 micrograms (mcg) of folic acid every day if you become pregnant.  Ask for birth control (contraception) if you want to prevent pregnancy. Osteoporosis and menopause Osteoporosis is a disease in which the bones lose minerals and strength with aging. This can result in bone fractures. If you are 30 years old or older, or if you are at risk for osteoporosis and fractures, ask your health care provider if you should:  Be screened for bone loss.  Take a calcium or vitamin D supplement to lower your risk of fractures.  Be given hormone replacement therapy (HRT) to treat symptoms of menopause. Follow these instructions at home: Lifestyle  Do not use any products that contain nicotine or tobacco, such as cigarettes, e-cigarettes, and chewing tobacco. If you need help quitting, ask your health care provider.  Do not use street drugs.  Do not share needles.  Ask your health care provider for help if you need support or information about quitting drugs. Alcohol use  Do not drink alcohol if: ? Your health care provider tells you not to drink. ? You are pregnant, may be pregnant, or are planning to become pregnant.   If you drink alcohol: ? Limit how much you use to 0-1 drink a day. ? Limit intake if you are breastfeeding.  Be aware of how much alcohol is in your drink. In the [place] one drink equals one 12 oz bottle of beer (355 mL), one 5 oz glass of wine (148 mL), or one 1 oz glass of hard liquor (44 mL). General instructions  Schedule regular health, dental, and eye exams.  Stay current with your vaccines.  Tell your health care provider if: ? You often feel depressed. ? You have ever been abused or do not feel safe at home. Summary  Adopting a healthy lifestyle and getting preventive care are important in promoting health and wellness.  Follow your health care provider's instructions about healthy diet, exercising, and getting tested or screened for diseases.  Follow your health care provider's instructions on monitoring your cholesterol and blood pressure. This information is not intended to replace advice given to you by your health care provider. Make sure you discuss any questions you have with your  health care provider. Document Released: 10/11/2010 Document Revised: 03/21/2018 Document Reviewed: 03/21/2018 Elsevier Patient Education  2020 [place] for Nurse Practitioners, 15(4), [id number]. Retrieved January 15, 2018 from http://clinicalkey.com/nursing">  Knee Exercises Ask your health care provider which exercises are safe for you. Do exercises exactly as told by your health care provider and adjust them as directed. It is normal to feel mild stretching, pulling, tightness, or discomfort as you do these exercises. Stop right away if you feel sudden pain or your pain gets worse. Do not begin these exercises until told by your health care provider. Stretching and range-of-motion exercises These exercises warm up your muscles and joints and improve the movement and flexibility of your knee. These exercises also help to relieve pain and swelling. Knee extension, prone 1.  Lie on your abdomen (prone position) on a bed. 2. Place your left / right knee just beyond the edge of the surface so your knee is not on the bed. You can put a towel under your left / right thigh just above your kneecap for comfort. 3. Relax your leg muscles and allow gravity to straighten your knee (extension). You should feel a stretch behind your left / right knee. 4. Hold this position for __________ seconds. 5. Scoot up so your knee is supported between repetitions. Repeat __________ times. Complete this exercise __________ times a day. Knee flexion, active  1. Lie on your back with both legs straight. If this causes back discomfort, bend your left / right knee so your foot is flat on the floor. 2. Slowly slide your left / right heel back toward your buttocks. Stop when you feel a gentle stretch in the front of your knee or thigh (flexion). 3. Hold this position for __________ seconds. 4. Slowly slide your left / right heel back to the starting position. Repeat __________ times. Complete this exercise __________ times a day. Quadriceps stretch, prone  12. Lie on your abdomen on a firm surface, such as a bed or padded floor. 36. Bend your left / right knee and hold your ankle. If you cannot reach your ankle or pant leg, loop a belt around your foot and grab the belt instead. 14. Gently pull your heel toward your buttocks. Your knee should not slide out to the side. You should feel a stretch in the front of your thigh and knee (quadriceps). 15. Hold this position for __________ seconds. Repeat __________ times. Complete this exercise __________ times a day. Hamstring, supine 1. Lie on your back (supine position). 2. Loop a belt or towel over the ball of your left / right foot. The ball of your foot is on the walking surface, right under your toes. 3. Straighten your left / right knee and slowly pull on the belt to raise your leg until you feel a gentle stretch behind your knee (hamstring).  ? Do not let your knee bend while you do this. ? Keep your other leg flat on the floor. 4. Hold this position for __________ seconds. Repeat __________ times. Complete this exercise __________ times a day. Strengthening exercises These exercises build strength and endurance in your knee. Endurance is the ability to use your muscles for a long time, even after they get tired. Quadriceps, isometric This exercise stretches the muscles in front of your thigh (quadriceps) without moving your knee joint (isometric). 1. Lie on your back with your left / right leg extended and your other knee bent. Put a rolled towel or  small pillow under your knee if told by your health care provider. 2. Slowly tense the muscles in the front of your left / right thigh. You should see your kneecap slide up toward your hip or see increased dimpling just above the knee. This motion will push the back of the knee toward the floor. 3. For __________ seconds, hold the muscle as tight as you can without increasing your pain. 4. Relax the muscles slowly and completely. Repeat __________ times. Complete this exercise __________ times a day. Straight leg raises This exercise stretches the muscles in front of your thigh (quadriceps) and the muscles that move your hips (hip flexors). 1. Lie on your back with your left / right leg extended and your other knee bent. 2. Tense the muscles in the front of your left / right thigh. You should see your kneecap slide up or see increased dimpling just above the knee. Your thigh may even shake a bit. 3. Keep these muscles tight as you raise your leg 4-6 inches (10-15 cm) off the floor. Do not let your knee bend. 4. Hold this position for __________ seconds. 5. Keep these muscles tense as you lower your leg. 6. Relax your muscles slowly and completely after each repetition. Repeat __________ times. Complete this exercise __________ times a day. Hamstring, isometric 1. Lie on your back on a  firm surface. 2. Bend your left / right knee about __________ degrees. 3. Dig your left / right heel into the surface as if you are trying to pull it toward your buttocks. Tighten the muscles in the back of your thighs (hamstring) to "dig" as hard as you can without increasing any pain. 4. Hold this position for __________ seconds. 5. Release the tension gradually and allow your muscles to relax completely for __________ seconds after each repetition. Repeat __________ times. Complete this exercise __________ times a day. Hamstring curls If told by your health care provider, do this exercise while wearing ankle weights. Begin with __________ lb weights. Then increase the weight by 1 lb (0.5 kg) increments. Do not wear ankle weights that are more than __________ lb. 1. Lie on your abdomen with your legs straight. 2. Bend your left / right knee as far as you can without feeling pain. Keep your hips flat against the floor. 3. Hold this position for __________ seconds. 4. Slowly lower your leg to the starting position. Repeat __________ times. Complete this exercise __________ times a day. Squats This exercise strengthens the muscles in front of your thigh and knee (quadriceps). 1. Stand in front of a table, with your feet and knees pointing straight ahead. You may rest your hands on the table for balance but not for support. 2. Slowly bend your knees and lower your hips like you are going to sit in a chair. ? Keep your weight over your heels, not over your toes. ? Keep your lower legs upright so they are parallel with the table legs. ? Do not let your hips go lower than your knees. ? Do not bend lower than told by your health care provider. ? If your knee pain increases, do not bend as low. 3. Hold the squat position for __________ seconds. 4. Slowly push with your legs to return to standing. Do not use your hands to pull yourself to standing. Repeat __________ times. Complete this exercise  __________ times a day. Wall slides This exercise strengthens the muscles in front of your thigh and knee (quadriceps). 1. Lean your back  against a smooth wall or door, and walk your feet out 18-24 inches (46-61 cm) from it. 2. Place your feet hip-width apart. 3. Slowly slide down the wall or door until your knees bend __________ degrees. Keep your knees over your heels, not over your toes. Keep your knees in line with your hips. 4. Hold this position for __________ seconds. Repeat __________ times. Complete this exercise __________ times a day. Straight leg raises This exercise strengthens the muscles that rotate the leg at the hip and move it away from your body (hip abductors). 1. Lie on your side with your left / right leg in the top position. Lie so your head, shoulder, knee, and hip line up. You may bend your bottom knee to help you keep your balance. 2. Roll your hips slightly forward so your hips are stacked directly over each other and your left / right knee is facing forward. 3. Leading with your heel, lift your top leg 4-6 inches (10-15 cm). You should feel the muscles in your outer hip lifting. ? Do not let your foot drift forward. ? Do not let your knee roll toward the ceiling. 4. Hold this position for __________ seconds. 5. Slowly return your leg to the starting position. 6. Let your muscles relax completely after each repetition. Repeat __________ times. Complete this exercise __________ times a day. Straight leg raises This exercise stretches the muscles that move your hips away from the front of the pelvis (hip extensors). 1. Lie on your abdomen on a firm surface. You can put a pillow under your hips if that is more comfortable. 2. Tense the muscles in your buttocks and lift your left / right leg about 4-6 inches (10-15 cm). Keep your knee straight as you lift your leg. 3. Hold this position for __________ seconds. 4. Slowly lower your leg to the starting position. 5. Let  your leg relax completely after each repetition. Repeat __________ times. Complete this exercise __________ times a day. This information is not intended to replace advice given to you by your health care provider. Make sure you discuss any questions you have with your health care provider. Document Released: 02/09/2005 Document Revised: 01/16/2018 Document Reviewed: 01/16/2018 Elsevier Patient Education  2020 [place] Krystopher Kuenzel M.D.

## 2019-01-25 ENCOUNTER — Other Ambulatory Visit: Payer: Self-pay

## 2019-01-25 ENCOUNTER — Ambulatory Visit (INDEPENDENT_AMBULATORY_CARE_PROVIDER_SITE_OTHER): Payer: Medicare Other | Admitting: Internal Medicine

## 2019-01-25 ENCOUNTER — Encounter: Payer: Self-pay | Admitting: Internal Medicine

## 2019-01-25 VITALS — BP 138/80 | HR 65 | Temp 97.5°F | Ht 59.5 in | Wt 120.4 lb

## 2019-01-25 DIAGNOSIS — Z79899 Other long term (current) drug therapy: Secondary | ICD-10-CM | POA: Diagnosis not present

## 2019-01-25 DIAGNOSIS — I10 Essential (primary) hypertension: Secondary | ICD-10-CM | POA: Diagnosis not present

## 2019-01-25 DIAGNOSIS — M25562 Pain in left knee: Secondary | ICD-10-CM

## 2019-01-25 DIAGNOSIS — Z0000 Encounter for general adult medical examination without abnormal findings: Secondary | ICD-10-CM | POA: Diagnosis not present

## 2019-01-25 DIAGNOSIS — Z23 Encounter for immunization: Secondary | ICD-10-CM

## 2019-01-25 DIAGNOSIS — E785 Hyperlipidemia, unspecified: Secondary | ICD-10-CM

## 2019-01-25 DIAGNOSIS — E039 Hypothyroidism, unspecified: Secondary | ICD-10-CM

## 2019-01-25 DIAGNOSIS — E119 Type 2 diabetes mellitus without complications: Secondary | ICD-10-CM

## 2019-01-25 LAB — CBC WITH DIFFERENTIAL/PLATELET
Basophils Absolute: 0 10*3/uL (ref 0.0–0.1)
Basophils Relative: 0.9 % (ref 0.0–3.0)
Eosinophils Absolute: 0.3 10*3/uL (ref 0.0–0.7)
Eosinophils Relative: 5.7 % — ABNORMAL HIGH (ref 0.0–5.0)
HCT: 41.9 % (ref 36.0–46.0)
Hemoglobin: 13.8 g/dL (ref 12.0–15.0)
Lymphocytes Relative: 38.2 % (ref 12.0–46.0)
Lymphs Abs: 2 10*3/uL (ref 0.7–4.0)
MCHC: 33 g/dL (ref 30.0–36.0)
MCV: 91.1 fl (ref 78.0–100.0)
Monocytes Absolute: 0.3 10*3/uL (ref 0.1–1.0)
Monocytes Relative: 6.4 % (ref 3.0–12.0)
Neutro Abs: 2.6 10*3/uL (ref 1.4–7.7)
Neutrophils Relative %: 48.8 % (ref 43.0–77.0)
Platelets: 200 10*3/uL (ref 150.0–400.0)
RBC: 4.6 Mil/uL (ref 3.87–5.11)
RDW: 13.2 % (ref 11.5–15.5)
WBC: 5.3 10*3/uL (ref 4.0–10.5)

## 2019-01-25 LAB — BASIC METABOLIC PANEL
BUN: 12 mg/dL (ref 6–23)
CO2: 28 mEq/L (ref 19–32)
Calcium: 9.6 mg/dL (ref 8.4–10.5)
Chloride: 106 mEq/L (ref 96–112)
Creatinine, Ser: 0.66 mg/dL (ref 0.40–1.20)
GFR: 88.13 mL/min (ref >60.00)
Glucose, Bld: 100 mg/dL — ABNORMAL HIGH (ref 70–99)
Potassium: 4.1 mEq/L (ref 3.5–5.1)
Sodium: 142 mEq/L (ref 135–145)

## 2019-01-25 LAB — LIPID PANEL
Cholesterol: 124 mg/dL (ref 0–200)
HDL: 48.8 mg/dL (ref >39.00)
LDL Cholesterol: 52 mg/dL (ref 0–99)
NonHDL: 74.85
Total CHOL/HDL Ratio: 3
Triglycerides: 113 mg/dL (ref 0.0–149.0)
VLDL: 22.6 mg/dL (ref 0.0–40.0)

## 2019-01-25 LAB — HEPATIC FUNCTION PANEL
ALT: 16 U/L (ref 0–35)
AST: 18 U/L (ref 0–37)
Albumin: 4.5 g/dL (ref 3.5–5.2)
Alkaline Phosphatase: 56 U/L (ref 39–117)
Bilirubin, Direct: 0.2 mg/dL (ref 0.0–0.3)
Total Bilirubin: 0.8 mg/dL (ref 0.2–1.2)
Total Protein: 6.6 g/dL (ref 6.0–8.3)

## 2019-01-25 LAB — HEMOGLOBIN A1C: Hgb A1c MFr Bld: 6.6 % — ABNORMAL HIGH (ref 4.6–6.5)

## 2019-01-25 LAB — TSH: TSH: 0.08 u[IU]/mL — ABNORMAL LOW (ref 0.35–4.50)

## 2019-01-25 NOTE — Patient Instructions (Addendum)
Can check knee rehab exercise, to continue  Ice  After  otc voltaren gel over joint  .   Will notify you  of labs when available.  Get your eye exam  Get  Your colon cancer screening. Continue lifestyle intervention healthy eating and exercise .   Advise see  ENT   If persistent loss of smell again . Could be recurrence of polyps .    If you change to generic thyroid then check tsh in 3-6 months to ensure still in range     Health Maintenance, Female Adopting a healthy lifestyle and getting preventive care are important in promoting health and wellness. Ask your health care provider about:  The right schedule for you to have regular tests and exams.  Things you can do on your own to prevent diseases and keep yourself healthy. What should I know about diet, weight, and exercise? Eat a healthy diet   Eat a diet that includes plenty of vegetables, fruits, low-fat dairy products, and lean protein.  Do not eat a lot of foods that are high in solid fats, added sugars, or sodium. Maintain a healthy weight Body mass index (BMI) is used to identify weight problems. It estimates body fat based on height and weight. Your health care provider can help determine your BMI and help you achieve or maintain a healthy weight. Get regular exercise Get regular exercise. This is one of the most important things you can do for your health. Most adults should:  Exercise for at least 150 minutes each week. The exercise should increase your heart rate and make you sweat (moderate-intensity exercise).  Do strengthening exercises at least twice a week. This is in addition to the moderate-intensity exercise.  Spend less time sitting. Even light physical activity can be beneficial. Watch cholesterol and blood lipids Have your blood tested for lipids and cholesterol at 71 years of age, then have this test every 5 years. Have your cholesterol levels checked more often if:  Your lipid or cholesterol levels  are high.  You are older than 71 years of age.  You are at high risk for heart disease. What should I know about cancer screening? Depending on your health history and family history, you may need to have cancer screening at various ages. This may include screening for:  Breast cancer.  Cervical cancer.  Colorectal cancer.  Skin cancer.  Lung cancer. What should I know about heart disease, diabetes, and high blood pressure? Blood pressure and heart disease  High blood pressure causes heart disease and increases the risk of stroke. This is more likely to develop in people who have high blood pressure readings, are of African descent, or are overweight.  Have your blood pressure checked: ? Every 3-5 years if you are 65-19 years of age. ? Every year if you are 87 years old or older. Diabetes Have regular diabetes screenings. This checks your fasting blood sugar level. Have the screening done:  Once every three years after age 55 if you are at a normal weight and have a low risk for diabetes.  More often and at a younger age if you are overweight or have a high risk for diabetes. What should I know about preventing infection? Hepatitis B If you have a higher risk for hepatitis B, you should be screened for this virus. Talk with your health care provider to find out if you are at risk for hepatitis B infection. Hepatitis C Testing is recommended for:  Everyone  born from [birth year] through 1963.  Anyone with known risk factors for hepatitis C. Sexually transmitted infections (STIs)  Get screened for STIs, including gonorrhea and chlamydia, if: ? You are sexually active and are younger than 71 years of age. ? You are older than 71 years of age and your health care provider tells you that you are at risk for this type of infection. ? Your sexual activity has changed since you were last screened, and you are at increased risk for chlamydia or gonorrhea. Ask your health care provider if  you are at risk.  Ask your health care provider about whether you are at high risk for HIV. Your health care provider may recommend a prescription medicine to help prevent HIV infection. If you choose to take medicine to prevent HIV, you should first get tested for HIV. You should then be tested every 3 months for as long as you are taking the medicine. Pregnancy  If you are about to stop having your period (premenopausal) and you may become pregnant, seek counseling before you get pregnant.  Take 400 to 800 micrograms (mcg) of folic acid every day if you become pregnant.  Ask for birth control (contraception) if you want to prevent pregnancy. Osteoporosis and menopause Osteoporosis is a disease in which the bones lose minerals and strength with aging. This can result in bone fractures. If you are 39 years old or older, or if you are at risk for osteoporosis and fractures, ask your health care provider if you should:  Be screened for bone loss.  Take a calcium or vitamin D supplement to lower your risk of fractures.  Be given hormone replacement therapy (HRT) to treat symptoms of menopause. Follow these instructions at home: Lifestyle  Do not use any products that contain nicotine or tobacco, such as cigarettes, e-cigarettes, and chewing tobacco. If you need help quitting, ask your health care provider.  Do not use street drugs.  Do not share needles.  Ask your health care provider for help if you need support or information about quitting drugs. Alcohol use  Do not drink alcohol if: ? Your health care provider tells you not to drink. ? You are pregnant, may be pregnant, or are planning to become pregnant.  If you drink alcohol: ? Limit how much you use to 0-1 drink a day. ? Limit intake if you are breastfeeding.  Be aware of how much alcohol is in your drink. In the [place] one drink equals one 12 oz bottle of beer (355 mL), one 5 oz glass of wine (148 mL), or one 1 oz glass of  hard liquor (44 mL). General instructions  Schedule regular health, dental, and eye exams.  Stay current with your vaccines.  Tell your health care provider if: ? You often feel depressed. ? You have ever been abused or do not feel safe at home. Summary  Adopting a healthy lifestyle and getting preventive care are important in promoting health and wellness.  Follow your health care provider's instructions about healthy diet, exercising, and getting tested or screened for diseases.  Follow your health care provider's instructions on monitoring your cholesterol and blood pressure. This information is not intended to replace advice given to you by your health care provider. Make sure you discuss any questions you have with your health care provider. Document Released: 10/11/2010 Document Revised: 03/21/2018 Document Reviewed: 03/21/2018 Elsevier Patient Education  2020 [place] for Nurse Practitioners, 15(4), [phone]. Retrieved January 15, 2018 from http://clinicalkey.com/nursing">  Knee Exercises Ask your health care provider which exercises are safe for you. Do exercises exactly as told by your health care provider and adjust them as directed. It is normal to feel mild stretching, pulling, tightness, or discomfort as you do these exercises. Stop right away if you feel sudden pain or your pain gets worse. Do not begin these exercises until told by your health care provider. Stretching and range-of-motion exercises These exercises warm up your muscles and joints and improve the movement and flexibility of your knee. These exercises also help to relieve pain and swelling. Knee extension, prone 1. Lie on your abdomen (prone position) on a bed. 2. Place your left / right knee just beyond the edge of the surface so your knee is not on the bed. You can put a towel under your left / right thigh just above your kneecap for comfort. 3. Relax your leg muscles and allow gravity to  straighten your knee (extension). You should feel a stretch behind your left / right knee. 4. Hold this position for __________ seconds. 5. Scoot up so your knee is supported between repetitions. Repeat __________ times. Complete this exercise __________ times a day. Knee flexion, active  1. Lie on your back with both legs straight. If this causes back discomfort, bend your left / right knee so your foot is flat on the floor. 2. Slowly slide your left / right heel back toward your buttocks. Stop when you feel a gentle stretch in the front of your knee or thigh (flexion). 3. Hold this position for __________ seconds. 4. Slowly slide your left / right heel back to the starting position. Repeat __________ times. Complete this exercise __________ times a day. Quadriceps stretch, prone  1. Lie on your abdomen on a firm surface, such as a bed or padded floor. 2. Bend your left / right knee and hold your ankle. If you cannot reach your ankle or pant leg, loop a belt around your foot and grab the belt instead. 3. Gently pull your heel toward your buttocks. Your knee should not slide out to the side. You should feel a stretch in the front of your thigh and knee (quadriceps). 4. Hold this position for __________ seconds. Repeat __________ times. Complete this exercise __________ times a day. Hamstring, supine 1. Lie on your back (supine position). 2. Loop a belt or towel over the ball of your left / right foot. The ball of your foot is on the walking surface, right under your toes. 3. Straighten your left / right knee and slowly pull on the belt to raise your leg until you feel a gentle stretch behind your knee (hamstring). ? Do not let your knee bend while you do this. ? Keep your other leg flat on the floor. 4. Hold this position for __________ seconds. Repeat __________ times. Complete this exercise __________ times a day. Strengthening exercises These exercises build strength and endurance in  your knee. Endurance is the ability to use your muscles for a long time, even after they get tired. Quadriceps, isometric This exercise stretches the muscles in front of your thigh (quadriceps) without moving your knee joint (isometric). 1. Lie on your back with your left / right leg extended and your other knee bent. Put a rolled towel or small pillow under your knee if told by your health care provider. 2. Slowly tense the muscles in the front of your left / right thigh. You should see your kneecap  slide up toward your hip or see increased dimpling just above the knee. This motion will push the back of the knee toward the floor. 3. For __________ seconds, hold the muscle as tight as you can without increasing your pain. 4. Relax the muscles slowly and completely. Repeat __________ times. Complete this exercise __________ times a day. Straight leg raises This exercise stretches the muscles in front of your thigh (quadriceps) and the muscles that move your hips (hip flexors). 1. Lie on your back with your left / right leg extended and your other knee bent. 2. Tense the muscles in the front of your left / right thigh. You should see your kneecap slide up or see increased dimpling just above the knee. Your thigh may even shake a bit. 3. Keep these muscles tight as you raise your leg 4-6 inches (10-15 cm) off the floor. Do not let your knee bend. 4. Hold this position for __________ seconds. 5. Keep these muscles tense as you lower your leg. 6. Relax your muscles slowly and completely after each repetition. Repeat __________ times. Complete this exercise __________ times a day. Hamstring, isometric 1. Lie on your back on a firm surface. 2. Bend your left / right knee about __________ degrees. 3. Dig your left / right heel into the surface as if you are trying to pull it toward your buttocks. Tighten the muscles in the back of your thighs (hamstring) to "dig" as hard as you can without increasing any  pain. 4. Hold this position for __________ seconds. 5. Release the tension gradually and allow your muscles to relax completely for __________ seconds after each repetition. Repeat __________ times. Complete this exercise __________ times a day. Hamstring curls If told by your health care provider, do this exercise while wearing ankle weights. Begin with __________ lb weights. Then increase the weight by 1 lb (0.5 kg) increments. Do not wear ankle weights that are more than __________ lb. 1. Lie on your abdomen with your legs straight. 2. Bend your left / right knee as far as you can without feeling pain. Keep your hips flat against the floor. 3. Hold this position for __________ seconds. 4. Slowly lower your leg to the starting position. Repeat __________ times. Complete this exercise __________ times a day. Squats This exercise strengthens the muscles in front of your thigh and knee (quadriceps). 1. Stand in front of a table, with your feet and knees pointing straight ahead. You may rest your hands on the table for balance but not for support. 2. Slowly bend your knees and lower your hips like you are going to sit in a chair. ? Keep your weight over your heels, not over your toes. ? Keep your lower legs upright so they are parallel with the table legs. ? Do not let your hips go lower than your knees. ? Do not bend lower than told by your health care provider. ? If your knee pain increases, do not bend as low. 3. Hold the squat position for __________ seconds. 4. Slowly push with your legs to return to standing. Do not use your hands to pull yourself to standing. Repeat __________ times. Complete this exercise __________ times a day. Wall slides This exercise strengthens the muscles in front of your thigh and knee (quadriceps). 1. Lean your back against a smooth wall or door, and walk your feet out 18-24 inches (46-61 cm) from it. 2. Place your feet hip-width apart. 3. Slowly slide down  the wall or door  until your knees bend __________ degrees. Keep your knees over your heels, not over your toes. Keep your knees in line with your hips. 4. Hold this position for __________ seconds. Repeat __________ times. Complete this exercise __________ times a day. Straight leg raises This exercise strengthens the muscles that rotate the leg at the hip and move it away from your body (hip abductors). 1. Lie on your side with your left / right leg in the top position. Lie so your head, shoulder, knee, and hip line up. You may bend your bottom knee to help you keep your balance. 2. Roll your hips slightly forward so your hips are stacked directly over each other and your left / right knee is facing forward. 3. Leading with your heel, lift your top leg 4-6 inches (10-15 cm). You should feel the muscles in your outer hip lifting. ? Do not let your foot drift forward. ? Do not let your knee roll toward the ceiling. 4. Hold this position for __________ seconds. 5. Slowly return your leg to the starting position. 6. Let your muscles relax completely after each repetition. Repeat __________ times. Complete this exercise __________ times a day. Straight leg raises This exercise stretches the muscles that move your hips away from the front of the pelvis (hip extensors). 1. Lie on your abdomen on a firm surface. You can put a pillow under your hips if that is more comfortable. 2. Tense the muscles in your buttocks and lift your left / right leg about 4-6 inches (10-15 cm). Keep your knee straight as you lift your leg. 3. Hold this position for __________ seconds. 4. Slowly lower your leg to the starting position. 5. Let your leg relax completely after each repetition. Repeat __________ times. Complete this exercise __________ times a day. This information is not intended to replace advice given to you by your health care provider. Make sure you discuss any questions you have with your health care  provider. Document Released: 02/09/2005 Document Revised: 01/16/2018 Document Reviewed: 01/16/2018 Elsevier Patient Education  2020 [employer].

## 2019-01-28 ENCOUNTER — Other Ambulatory Visit: Payer: Self-pay

## 2019-01-28 DIAGNOSIS — E039 Hypothyroidism, unspecified: Secondary | ICD-10-CM

## 2019-01-29 ENCOUNTER — Telehealth: Payer: Self-pay | Admitting: *Deleted

## 2019-01-29 NOTE — Telephone Encounter (Signed)
Pt wanted to let us know she has stopped taking metformin

## 2019-01-29 NOTE — Telephone Encounter (Signed)
Copied from [name] [id number]. Topic: Quick Communication - Lab Results (Clinic Use ONLY) >> Jan 29, 2019  1:05 PM Debbie Marshall wrote: Pt is calling and would like callback to go over her blood work results from last week

## 2019-02-04 NOTE — Telephone Encounter (Signed)
Please send her this response   That is interesting  Stopping the metformin could be the issue    Future orders have been place for tsh and free t4   Please help her get a lab appt .   She may still want to be on metformin to help prevent further progression of diabetes control     . But I'm ok  With either at this time    ...she can choose .

## 2019-02-26 ENCOUNTER — Other Ambulatory Visit (INDEPENDENT_AMBULATORY_CARE_PROVIDER_SITE_OTHER): Payer: Medicare Other

## 2019-02-26 ENCOUNTER — Other Ambulatory Visit: Payer: Self-pay

## 2019-02-26 DIAGNOSIS — E119 Type 2 diabetes mellitus without complications: Secondary | ICD-10-CM

## 2019-02-26 DIAGNOSIS — E039 Hypothyroidism, unspecified: Secondary | ICD-10-CM

## 2019-02-26 DIAGNOSIS — E785 Hyperlipidemia, unspecified: Secondary | ICD-10-CM

## 2019-02-26 DIAGNOSIS — Z0000 Encounter for general adult medical examination without abnormal findings: Secondary | ICD-10-CM

## 2019-02-26 DIAGNOSIS — Z79899 Other long term (current) drug therapy: Secondary | ICD-10-CM | POA: Diagnosis not present

## 2019-02-26 DIAGNOSIS — R0981 Nasal congestion: Secondary | ICD-10-CM | POA: Diagnosis not present

## 2019-02-26 LAB — T4, FREE: Free T4: 1.03 ng/dL (ref 0.60–1.60)

## 2019-02-26 LAB — TSH: TSH: 0.34 u[IU]/mL — ABNORMAL LOW (ref 0.35–4.50)

## 2019-02-27 ENCOUNTER — Other Ambulatory Visit: Payer: Self-pay

## 2019-02-27 DIAGNOSIS — E039 Hypothyroidism, unspecified: Secondary | ICD-10-CM

## 2019-03-21 DIAGNOSIS — E119 Type 2 diabetes mellitus without complications: Secondary | ICD-10-CM

## 2019-03-22 MED ORDER — SIMVASTATIN 40 MG PO TABS: 40.0000 mg | ORAL_TABLET | Freq: Every day | ORAL | 0 refills | Status: DC

## 2019-03-22 MED ORDER — SYNTHROID 75 MCG PO TABS: 75.0000 ug | ORAL_TABLET | Freq: Every day | ORAL | 1 refills | Status: DC

## 2019-03-22 MED ORDER — METFORMIN HCL ER 500 MG PO TB24: 1000.0000 mg | ORAL_TABLET | Freq: Every day | ORAL | 0 refills | Status: DC

## 2019-03-26 MED ORDER — ACCU-CHEK AVIVA CONNECT W/DEVICE KIT: 1.0000 | PACK | Freq: Two times a day (BID) | 0 refills | Status: DC

## 2019-03-26 NOTE — Addendum Note (Signed)
Addended by: Modena Morrow R on: 03/26/2019 01:24 PM   Modules accepted: Orders

## 2019-04-02 MED ORDER — LEVOTHYROXINE SODIUM 75 MCG PO TABS: 75.0000 ug | ORAL_TABLET | Freq: Every day | ORAL | 1 refills | Status: DC

## 2019-04-02 NOTE — Addendum Note (Signed)
Addended by: Modena Morrow R on: 04/02/2019 12:29 PM   Modules accepted: Orders

## 2019-04-04 MED ORDER — ACCU-CHEK SOFT TOUCH LANCETS MISC: 12 refills | Status: DC

## 2019-04-04 MED ORDER — ACCU-CHEK AVIVA PLUS VI STRP: ORAL_STRIP | 12 refills | Status: DC

## 2019-04-04 NOTE — Addendum Note (Signed)
Addended by: Modena Morrow R on: 04/04/2019 08:41 AM   Modules accepted: Orders

## 2019-04-15 NOTE — Telephone Encounter (Signed)
Called pt. Pt has been made appt

## 2019-04-16 ENCOUNTER — Encounter: Payer: Self-pay | Admitting: Family Medicine

## 2019-04-16 ENCOUNTER — Other Ambulatory Visit: Payer: Self-pay

## 2019-04-16 ENCOUNTER — Ambulatory Visit (INDEPENDENT_AMBULATORY_CARE_PROVIDER_SITE_OTHER): Payer: Medicare Other | Admitting: Family Medicine

## 2019-04-16 VITALS — BP 118/72 | HR 67 | Temp 97.1°F | Ht 59.5 in | Wt 116.1 lb

## 2019-04-16 DIAGNOSIS — M5416 Radiculopathy, lumbar region: Secondary | ICD-10-CM

## 2019-04-16 DIAGNOSIS — R2990 Unspecified symptoms and signs involving the nervous system: Secondary | ICD-10-CM | POA: Diagnosis not present

## 2019-04-16 NOTE — Patient Instructions (Signed)
Radicular Pain Radicular pain is a type of pain that spreads from your back or neck along a spinal nerve. Spinal nerves are nerves that leave the spinal cord and go to the muscles. Radicular pain is sometimes called radiculopathy, radiculitis, or a pinched nerve. When you have this type of pain, you may also have weakness, numbness, or tingling in the area of your body that is supplied by the nerve. The pain may feel sharp and burning. Depending on which spinal nerve is affected, the pain may occur in the:  Neck area (cervical radicular pain). You may also feel pain, numbness, weakness, or tingling in the arms.  Mid-spine area (thoracic radicular pain). You would feel this pain in the back and chest. This type is rare.  Lower back area (lumbar radicular pain). You would feel this pain as low back pain. You may feel pain, numbness, weakness, or tingling in the buttocks or legs. Sciatica is a type of lumbar radicular pain that shoots down the back of the leg. Radicular pain occurs when one of the spinal nerves becomes irritated or squeezed (compressed). It is often caused by something pushing on a spinal nerve, such as one of the bones of the spine (vertebrae) or one of the round cushions between vertebrae (intervertebral disks). This can result from:  An injury.  Wear and tear or aging of a disk.  The growth of a bone spur that pushes on the nerve. Radicular pain often goes away when you follow instructions from your health care provider for relieving pain at home. Follow these instructions at home: Managing pain      If directed, put ice on the affected area: ? Put ice in a plastic bag. ? Place a towel between your skin and the bag. ? Leave the ice on for 20 minutes, 2-3 times a day.  If directed, apply heat to the affected area as often as told by your health care provider. Use the heat source that your health care provider recommends, such as a moist heat pack or a heating pad. ? Place  a towel between your skin and the heat source. ? Leave the heat on for 20-30 minutes. ? Remove the heat if your skin turns bright red. This is especially important if you are unable to feel pain, heat, or cold. You may have a greater risk of getting burned. Activity   Do not sit or rest in bed for long periods of time.  Try to stay as active as possible. Ask your health care provider what type of exercise or activity is best for you.  Avoid activities that make your pain worse, such as bending and lifting.  Do not lift anything that is heavier than 10 lb (4.5 kg), or the limit that you are told, until your health care provider says that it is safe.  Practice using proper technique when lifting items. Proper lifting technique involves bending your knees and rising up.  Do strength and range-of-motion exercises only as told by your health care provider or physical therapist. General instructions  Take over-the-counter and prescription medicines only as told by your health care provider.  Pay attention to any changes in your symptoms.  Keep all follow-up visits as told by your health care provider. This is important. ? Your health care provider may send you to a physical therapist to help with this pain. Contact a health care provider if:  Your pain and other symptoms get worse.  Your pain medicine is not   helping.  Your pain has not improved after a few weeks of home care.  You have a fever. Get help right away if:  You have severe pain, weakness, or numbness.  You have difficulty with bladder or bowel control. Summary  Radicular pain is a type of pain that spreads from your back or neck along a spinal nerve.  When you have radicular pain, you may also have weakness, numbness, or tingling in the area of your body that is supplied by the nerve.  The pain may feel sharp or burning.  Radicular pain may be treated with ice, heat, medicines, or physical therapy. This  information is not intended to replace advice given to you by your health care provider. Make sure you discuss any questions you have with your health care provider. Document Revised: 10/10/2017 Document Reviewed: 10/10/2017 Elsevier Patient Education  2020 [employer]  

## 2019-04-16 NOTE — Progress Notes (Signed)
Subjective:     Patient ID: Debbie Marshall, female   DOB: February 03, 1946, 74 y.o.   MRN: [id number]  HPI Debbie Marshall is seen with low back pain with right radiculitis symptoms.  She states she has had some low back pain intermittently for years.  Current episode started around December 25.  She has spent considerable amount of time the day before cooking but denies any specific injury.  She has some pain radiating from the right lower lumbar area into the right thigh.  She has noticed some numbness involving the right thigh and right lateral foot and also weakness with plantar flexion on the right side.  No urine or stool incontinence.  No fever.  Her back pain is actually some improved at this time but she is mostly concerned about the neurologic symptoms of numbness and weakness which she has not had previously with her back flareups.  No history of any back surgery.  She has taken Advil which helps.  She does not have any associated appetite or weight changes.  No history of malignancy  Past Medical History:  Diagnosis Date  . ABNORMAL EXAM-BILIARY TRACT 11/14/2008  . ABNORMAL TRANSAMINASE, (LFT'S) 11/14/2008    2005 US shows fatty liver  . Cancer ([place]) 1996   tongue cancer  . Diabetes mellitus without complication ([place])   . FASTING HYPERGLYCEMIA 09/07/2006  . GERD 09/07/2006  . HYPERLIPIDEMIA 09/07/2006  . HYPOTHYROIDISM 10/30/2006  . Nonspecific abnormal results of liver function study 09/07/2006  . Normal nuclear stress test 2005   stress cardiolite   . OSTEOARTHRITIS 09/07/2006  . Positive PPD    received vaccine [place] home [place]- shows positive after receiving  . 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 VITAMIN D DEFICIENCY 08/14/2009   Past Surgical History:  Procedure Laterality Date  . COLONOSCOPY    . [place]   Tumor removed on back of tongue and salivary gland    reports that she has never smoked. She has never used smokeless tobacco. She  reports current alcohol use of about 1.0 standard drinks of alcohol per week. She reports that she does not use drugs. family history includes Arthritis in her brother and mother; Colon cancer in an other family member; Gout in her brother; Rheum arthritis in her brother; Stroke (age of onset: 75) in her mother. No Known Allergies   Review of Systems  Constitutional: Negative for appetite change, chills, fever and unexpected weight change.  Respiratory: Negative for shortness of breath.   Cardiovascular: Negative for chest pain.  Gastrointestinal: Negative for abdominal pain.  Genitourinary: Negative for dysuria.  Musculoskeletal: Positive for back pain.  Neurological: Positive for weakness and numbness.       Objective:   Physical Exam Vitals reviewed.  Constitutional:      Appearance: Normal appearance.  Cardiovascular:     Rate and Rhythm: Normal rate and regular rhythm.  Pulmonary:     Effort: Pulmonary effort is normal.     Breath sounds: Normal breath sounds.  Musculoskeletal:     Right lower leg: No edema.     Left lower leg: No edema.     Comments: Straight leg raise are negative bilaterally  Neurological:     Mental Status: She is alert.     Comments: Knee reflexes are 2+ bilaterally.  She has 2+ reflex left ankle and diminished reflex right ankle.  She has fairly good strength with dorsiflexion  bilaterally but has diminished strength with plantarflexion on the right compared to the left.  Good strength with knee extension bilaterally She has some decrease sensation to touch right lateral and posterior thigh        Assessment:     Right lumbar radiculitis symptoms with neurologic deficits noted including decreased sensation right lateral thigh, decreased right ankle reflex, and weakness with right plantar flexion    Plan:     -Given multiple neuro deficits as above (which are new) proceed with MRI to further assess -Follow-up immediately for any urine or stool  incontinence or any progressive pain or weakness  Eulas Post MD [facility] at [facility]

## 2019-04-22 ENCOUNTER — Ambulatory Visit
Admission: RE | Admit: 2019-04-22 | Discharge: 2019-04-22 | Disposition: A | Discharge status: Home / Self Care | Payer: Medicare Other | Source: Ambulatory Visit | Attending: Family Medicine | Admitting: Family Medicine

## 2019-04-22 ENCOUNTER — Other Ambulatory Visit: Payer: Self-pay

## 2019-04-22 DIAGNOSIS — M5416 Radiculopathy, lumbar region: Secondary | ICD-10-CM

## 2019-04-22 DIAGNOSIS — R2990 Unspecified symptoms and signs involving the nervous system: Secondary | ICD-10-CM

## 2019-04-22 DIAGNOSIS — M48061 Spinal stenosis, lumbar region without neurogenic claudication: Secondary | ICD-10-CM | POA: Diagnosis not present

## 2019-04-23 ENCOUNTER — Other Ambulatory Visit: Payer: Self-pay

## 2019-04-23 DIAGNOSIS — M5416 Radiculopathy, lumbar region: Secondary | ICD-10-CM

## 2019-05-06 DIAGNOSIS — M545 Low back pain: Secondary | ICD-10-CM | POA: Diagnosis not present

## 2019-05-06 DIAGNOSIS — M5116 Intervertebral disc disorders with radiculopathy, lumbar region: Secondary | ICD-10-CM | POA: Diagnosis not present

## 2019-07-10 ENCOUNTER — Other Ambulatory Visit: Payer: Self-pay | Admitting: Internal Medicine

## 2019-08-27 ENCOUNTER — Telehealth: Payer: Self-pay | Admitting: Internal Medicine

## 2019-08-27 NOTE — Chronic Care Management (AMB) (Signed)
  Chronic Care Management   Note  08/27/2019 Name: Cyncere Morea MRN: [id number] DOB: [birth year]  Landynn Macadams is a 74 y.o. year old female who is a primary care patient of Panosh, Standley Brooking, MD. I reached out to Heath Gold by phone today in response to a referral sent by Ms. Pricilla Bumpass's PCP, Panosh, Standley Brooking, MD.   Ms. Macchione was given information about Chronic Care Management services today including:  1. CCM service includes personalized support from designated clinical staff supervised by her physician, including individualized plan of care and coordination with other care providers 2. 24/7 contact phone numbers for assistance for urgent and routine care needs. 3. Service will only be billed when office clinical staff spend 20 minutes or more in a month to coordinate care. 4. Only one practitioner may furnish and bill the service in a calendar month. 5. The patient may stop CCM services at any time (effective at the end of the month) by phone call to the office staff.   Patient agreed to services and verbal consent obtained.   Follow up plan:   [place]

## 2019-09-06 ENCOUNTER — Other Ambulatory Visit: Payer: Self-pay

## 2019-09-06 ENCOUNTER — Encounter: Payer: Self-pay | Admitting: Internal Medicine

## 2019-09-06 ENCOUNTER — Ambulatory Visit (INDEPENDENT_AMBULATORY_CARE_PROVIDER_SITE_OTHER): Payer: Medicare Other | Admitting: Internal Medicine

## 2019-09-06 VITALS — BP 126/84 | HR 64 | Temp 98.0°F | Ht 59.5 in | Wt 118.4 lb

## 2019-09-06 DIAGNOSIS — Z79899 Other long term (current) drug therapy: Secondary | ICD-10-CM

## 2019-09-06 DIAGNOSIS — E119 Type 2 diabetes mellitus without complications: Secondary | ICD-10-CM | POA: Diagnosis not present

## 2019-09-06 DIAGNOSIS — E785 Hyperlipidemia, unspecified: Secondary | ICD-10-CM | POA: Diagnosis not present

## 2019-09-06 DIAGNOSIS — E039 Hypothyroidism, unspecified: Secondary | ICD-10-CM | POA: Diagnosis not present

## 2019-09-06 LAB — HEPATIC FUNCTION PANEL
ALT: 15 U/L (ref 0–35)
AST: 20 U/L (ref 0–37)
Albumin: 4.6 g/dL (ref 3.5–5.2)
Alkaline Phosphatase: 72 U/L (ref 39–117)
Bilirubin, Direct: 0.1 mg/dL (ref 0.0–0.3)
Total Bilirubin: 0.6 mg/dL (ref 0.2–1.2)
Total Protein: 7.1 g/dL (ref 6.0–8.3)

## 2019-09-06 LAB — CBC WITH DIFFERENTIAL/PLATELET
Basophils Absolute: 0 10*3/uL (ref 0.0–0.1)
Basophils Relative: 0.7 % (ref 0.0–3.0)
Eosinophils Absolute: 0.3 10*3/uL (ref 0.0–0.7)
Eosinophils Relative: 4.6 % (ref 0.0–5.0)
HCT: 43.4 % (ref 36.0–46.0)
Hemoglobin: 14.5 g/dL (ref 12.0–15.0)
Lymphocytes Relative: 29.7 % (ref 12.0–46.0)
Lymphs Abs: 2.1 10*3/uL (ref 0.7–4.0)
MCHC: 33.3 g/dL (ref 30.0–36.0)
MCV: 91 fl (ref 78.0–100.0)
Monocytes Absolute: 0.4 10*3/uL (ref 0.1–1.0)
Monocytes Relative: 5.3 % (ref 3.0–12.0)
Neutro Abs: 4.1 10*3/uL (ref 1.4–7.7)
Neutrophils Relative %: 59.7 % (ref 43.0–77.0)
Platelets: 221 10*3/uL (ref 150.0–400.0)
RBC: 4.77 Mil/uL (ref 3.87–5.11)
RDW: 13.2 % (ref 11.5–15.5)
WBC: 6.9 10*3/uL (ref 4.0–10.5)

## 2019-09-06 LAB — LIPID PANEL
Cholesterol: 176 mg/dL (ref 0–200)
HDL: 54.6 mg/dL (ref >39.00)
LDL Cholesterol: 93 mg/dL (ref 0–99)
NonHDL: 121.8
Total CHOL/HDL Ratio: 3
Triglycerides: 144 mg/dL (ref 0.0–149.0)
VLDL: 28.8 mg/dL (ref 0.0–40.0)

## 2019-09-06 LAB — MICROALBUMIN / CREATININE URINE RATIO
Creatinine,U: 185 mg/dL
Microalb Creat Ratio: 0.6 mg/g (ref 0.0–30.0)
Microalb, Ur: 1.2 mg/dL (ref 0.0–1.9)

## 2019-09-06 LAB — BASIC METABOLIC PANEL
BUN: 16 mg/dL (ref 6–23)
CO2: 30 mEq/L (ref 19–32)
Calcium: 9.8 mg/dL (ref 8.4–10.5)
Chloride: 101 mEq/L (ref 96–112)
Creatinine, Ser: 0.68 mg/dL (ref 0.40–1.20)
GFR: 85 mL/min (ref >60.00)
Glucose, Bld: 104 mg/dL — ABNORMAL HIGH (ref 70–99)
Potassium: 4.5 mEq/L (ref 3.5–5.1)
Sodium: 140 mEq/L (ref 135–145)

## 2019-09-06 LAB — T4, FREE: Free T4: 0.84 ng/dL (ref 0.60–1.60)

## 2019-09-06 LAB — HEMOGLOBIN A1C: Hgb A1c MFr Bld: 6.4 % (ref 4.6–6.5)

## 2019-09-06 LAB — TSH: TSH: 4.69 u[IU]/mL — ABNORMAL HIGH (ref 0.35–4.50)

## 2019-09-06 NOTE — Progress Notes (Signed)
Chief Complaint  Patient presents with  . Follow-up    Doing okay    HPI: Debbie Marshall 74 y.o. come in for Chronic disease management  And fu   Since last visit   We had changed thryoid dose to qod   And metformin once a day .  Not checking sugar at this time  But doing fine.  Radiculopathy  From disc protrusion   Right S1  Better but residual   numbless lateral right  foot .  No weakness   Has had eye check   To visit  gruam in July but will be back .  Has had covid vaccine ROS: See pertinent positives and negatives per HPI.  Has ocass rlq pelvic inguinal pain that is not severe off and on not sure if related to back or other   No change bowel habits or other assoc sx . Pain is transeint and not here today   Past Medical History:  Diagnosis Date  . ABNORMAL EXAM-BILIARY TRACT 11/14/2008  . ABNORMAL TRANSAMINASE, (LFT'S) 11/14/2008    2005 US shows fatty liver  . Cancer ([place]) 1996   tongue cancer  . Diabetes mellitus without complication ([place])   . FASTING HYPERGLYCEMIA 09/07/2006  . GERD 09/07/2006  . HYPERLIPIDEMIA 09/07/2006  . HYPOTHYROIDISM 10/30/2006  . Nonspecific abnormal results of liver function study 09/07/2006  . Normal nuclear stress test 2005   stress cardiolite   . OSTEOARTHRITIS 09/07/2006  . Positive PPD    received vaccine in home country- shows positive after receiving  . 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 VITAMIN D DEFICIENCY 08/14/2009    Family History  Problem Relation Age of Onset  . Stroke Mother 29  . Arthritis Mother   . Rheum arthritis Brother   . Arthritis Brother   . Gout Brother   . Colon cancer Other   . Esophageal cancer Neg Hx   . Stomach cancer Neg Hx   . Rectal cancer Neg Hx     Social History   Socioeconomic History  . Marital status: Single    Spouse name: Not on file  . Number of children: Not on file  . Years of education: Not on file  . Highest education level: Not on file  Occupational  History  . Occupation: PhD Professor  Tobacco Use  . Smoking status: Never Smoker  . Smokeless tobacco: Never Used  Substance and Sexual Activity  . Alcohol use: Yes    Alcohol/week: 1.0 standard drinks    Types: 1 Glasses of wine per week    Comment: rarely  . Drug use: No  . Sexual activity: Not on file  Other Topics Concern  . Not on file  Social History Narrative   HH of 2 Father and her    cat     PhD professor in information systems    No tobacco or alcohol exercising regularly currently   Social Determinants of Health   Financial Resource Strain:   . Difficulty of Paying Living Expenses:   Food Insecurity:   . Worried About Charity fundraiser in the Last Year:   . Arboriculturist in the Last Year:   Transportation Needs:   . Film/video editor (Medical):   Marland Kitchen Lack of Transportation (Non-Medical):   Physical Activity:   . Days of Exercise per Week:   . Minutes of Exercise per Session:   Stress:   .  Feeling of Stress :   Social Connections:   . Frequency of Communication with Friends and Family:   . Frequency of Social Gatherings with Friends and Family:   . Attends Religious Services:   . Active Member of Clubs or Organizations:   . Attends Archivist Meetings:   Marland Kitchen Marital Status:     Outpatient Medications Prior to Visit  Medication Sig Dispense Refill  . Ascorbic Acid (VITAMIN C) 500 MG CAPS Take 1 capsule by mouth daily.    . Blood Glucose Monitoring Suppl (ACCU-CHEK AVIVA CONNECT) w/Device KIT 1 kit by Does not apply route 2 (two) times daily. 1 kit 0  . Cholecalciferol 1000 UNITS capsule Take 1 capsule (1,000 Units total) by mouth daily. 90 capsule 3  . fluticasone (FLONASE) 50 MCG/ACT nasal spray Place 2 sprays into both nostrils daily. 16 g 2  . glucose blood (ACCU-CHEK AVIVA PLUS) test strip Test twice daily. 100 each 12  . Lancet Devices (ACCU-CHEK SOFTCLIX) lancets Test twice daily. 1 each 12  . Lancets (ACCU-CHEK SOFT TOUCH) lancets  Use as instructed 100 each 12  . levothyroxine (SYNTHROID) 75 MCG tablet Take 1 tablet (75 mcg total) by mouth daily. (Patient taking differently: Take 75 mcg by mouth every other day. ) 90 tablet 1  . metFORMIN (GLUCOPHAGE-XR) 500 MG 24 hr tablet Take 2 tablets (1,000 mg total) by mouth daily. (Patient taking differently: Take 500 mg by mouth daily. ) 180 tablet 0  . MULTIPLE VITAMIN PO Take 1 capsule by mouth daily.    . Omega-3 Fatty Acids (FISH OIL) 1000 MG CAPS Take by mouth.      . simvastatin (ZOCOR) 40 MG tablet TAKE 1 TABLET BY MOUTH AT  BEDTIME 90 tablet 3   No facility-administered medications prior to visit.     EXAM:  BP 126/84   Pulse 64   Temp 98 F (36.7 C) (Temporal)   Ht 4' 11.5" (1.511 m)   Wt 118 lb 6.4 oz (53.7 kg)   SpO2 98%   BMI 23.51 kg/m   Body mass index is 23.51 kg/m.  GENERAL: vitals reviewed and listed above, alert, oriented, appears well hydrated and in no acute distress HEENT: atraumatic, conjunctiva  clear, no obvious abnormalities on inspection of external nose and ears tm clear  OP :masked  NECK: no obvious masses on inspection palpation  LUNGS: clear to auscultation bilaterally, no wheezes, rales or rhonchi, good air movement CV: HRRR, no clubbing cyanosis or  peripheral edema nl cap refill  Abdomen:  Sof,t normal bowel sounds without hepatosplenomegaly, no guarding rebound or masses no CVA tenderness  Points to very low  Right pelvic almost groin area but  No mass tenderness   rom hip.  MS: moves all extremities without noticeable focal  Abnormality nl gait  PSYCH: pleasant and cooperative, no obvious depression or anxiety Lab Results  Component Value Date   WBC 6.9 09/06/2019   HGB 14.5 09/06/2019   HCT 43.4 09/06/2019   PLT 221.0 09/06/2019   GLUCOSE 104 (H) 09/06/2019   CHOL 176 09/06/2019   TRIG 144.0 09/06/2019   HDL 54.60 09/06/2019   LDLDIRECT 163.6 10/23/2006   LDLCALC 93 09/06/2019   ALT 15 09/06/2019   AST 20 09/06/2019    NA 140 09/06/2019   K 4.5 09/06/2019   CL 101 09/06/2019   CREATININE 0.68 09/06/2019   BUN 16 09/06/2019   CO2 30 09/06/2019   TSH 4.69 (H) 09/06/2019   INR 1.0  ratio 11/14/2008   HGBA1C 6.4 09/06/2019   MICROALBUR 1.2 09/06/2019   BP Readings from Last 3 Encounters:  09/06/19 126/84  04/16/19 118/72  01/25/19 138/80    ASSESSMENT AND PLAN:  Discussed the following assessment and plan:  Type 2 diabetes mellitus without complication, without long-term current use of insulin (HCC) - Plan: TSH, T4, free, Basic metabolic panel, Hemoglobin A1c, Lipid panel, Microalbumin / creatinine urine ratio, Hepatic function panel, CBC with Differential/Platelet  Hypothyroidism, unspecified type - now on qd and  change to generic  - Plan: TSH, T4, free, Basic metabolic panel, Hemoglobin A1c, Lipid panel, Microalbumin / creatinine urine ratio, Hepatic function panel, CBC with Differential/Platelet  Medication management - Plan: TSH, T4, free, Basic metabolic panel, Hemoglobin A1c, Lipid panel, Microalbumin / creatinine urine ratio, Hepatic function panel, CBC with Differential/Platelet  Hyperlipidemia, unspecified hyperlipidemia type - Plan: TSH, T4, free, Basic metabolic panel, Hemoglobin A1c, Lipid panel, Microalbumin / creatinine urine ratio, Hepatic function panel, CBC with Differential/Platelet r  s1 radiculopathy . Colon due October.  ifd  Pain is  persistent or progressive consider ct  Scan of abd pelvis  Exam normal  Checking   Thyroid a1c  eye exam utd  -Patient advised to return or notify health care team  if  new concerns arise. In interim   Patient Instructions  Lab today  Colonscopy is due  Oct 2021 If pain  persistent or progressive we can do more evaluation but exam is good.   Will notify you  of labs when available.        Standley Brooking. Brenon Antosh M.D.

## 2019-09-06 NOTE — Patient Instructions (Signed)
Lab today  Colonscopy is due  Oct 2021 If pain  persistent or progressive we can do more evaluation but exam is good.   Will notify you  of labs when available.

## 2019-09-07 NOTE — Progress Notes (Signed)
Thyroid tsh up  slightly now .Every other day is too low a dose on the generic   Increase levothyroxine  to taking 6 days per week    ex take every day except Saturday ( megan record  change on sig please)     Recheck tsh in 3-4 months  .  Rest of labs are stable

## 2019-09-10 ENCOUNTER — Other Ambulatory Visit: Payer: Self-pay | Admitting: Internal Medicine

## 2019-09-11 ENCOUNTER — Telehealth: Payer: Self-pay | Admitting: Internal Medicine

## 2019-09-11 ENCOUNTER — Other Ambulatory Visit: Payer: Self-pay

## 2019-09-11 DIAGNOSIS — E039 Hypothyroidism, unspecified: Secondary | ICD-10-CM

## 2019-09-11 NOTE — Telephone Encounter (Signed)
Debbie Marshall pt returned your call for lab results

## 2019-09-11 NOTE — Telephone Encounter (Signed)
Called patient and LMOVM to return call  Need to go over lab results.

## 2019-09-12 NOTE — Telephone Encounter (Signed)
Patient called back and I gave her the lab results. Please see lab results.

## 2019-09-25 ENCOUNTER — Other Ambulatory Visit: Payer: Self-pay

## 2019-09-25 DIAGNOSIS — E119 Type 2 diabetes mellitus without complications: Secondary | ICD-10-CM

## 2019-09-25 DIAGNOSIS — E039 Hypothyroidism, unspecified: Secondary | ICD-10-CM

## 2019-09-26 ENCOUNTER — Ambulatory Visit: Payer: Medicare Other

## 2019-09-27 ENCOUNTER — Telehealth: Payer: Self-pay

## 2019-09-27 NOTE — Telephone Encounter (Signed)
°  Chronic Care Management   Outreach Note  09/27/2019 Name: Debbie Marshall MRN: [id number] DOB: 11-02-1945  Referred by: Burnis Medin, MD Reason for referral : Chronic Care Management  Called patient for missed in-person visit. Explained CCM services and patient agreed to reschedule initial visit.   Follow Up Plan:  Initial CCM in-person visit rescheduled to 10/03/19 at 12:30 PM    Anson Crofts, PharmD Clinical Pharmacist [facility] [place] [phone]

## 2019-10-03 ENCOUNTER — Ambulatory Visit: Payer: Medicare Other

## 2019-10-03 ENCOUNTER — Other Ambulatory Visit: Payer: Self-pay

## 2019-10-03 ENCOUNTER — Telehealth: Payer: Self-pay

## 2019-10-03 DIAGNOSIS — E039 Hypothyroidism, unspecified: Secondary | ICD-10-CM

## 2019-10-03 DIAGNOSIS — E785 Hyperlipidemia, unspecified: Secondary | ICD-10-CM

## 2019-10-03 DIAGNOSIS — M85851 Other specified disorders of bone density and structure, right thigh: Secondary | ICD-10-CM

## 2019-10-03 DIAGNOSIS — E119 Type 2 diabetes mellitus without complications: Secondary | ICD-10-CM

## 2019-10-03 NOTE — Telephone Encounter (Signed)
  Chronic Care Management   Outreach Note  10/03/2019 Name: Debbie Marshall MRN: [id number] DOB: 1945/11/18  Referred by: Burnis Medin, MD Reason for referral : No chief complaint on file.   An unsuccessful telephone outreach was attempted today. The patient was referred to the pharmacist for assistance with care management and care coordination.   Follow Up Plan:  Left message for patient to return call to: [phone]. If do not hear back from patient today, will forward patient information to scheduling team to reschedule initial CCM visit.   Anson Crofts, PharmD Clinical Pharmacist [facility] [place] [phone]

## 2019-10-03 NOTE — Telephone Encounter (Signed)
  Chronic Care Management   Outreach Note  10/03/2019 Name: [name] MRN: [id number] DOB: [birth year]  Referred by: Burnis Medin, MD Reason for referral : No chief complaint on file.  Received call from patient.  Patient requested in-person visit in 10 minutes.   Follow Up Plan:  Updated schedule to reflect this   Anson Crofts, PharmD Clinical Pharmacist [facility] [place] [phone]

## 2019-10-03 NOTE — Chronic Care Management (AMB) (Signed)
Erroneous encounter

## 2019-10-04 NOTE — Chronic Care Management (AMB) (Signed)
Chronic Care Management Pharmacy  Name: Debbie Marshall  MRN: [id number] DOB: 1946-03-27  Initial Questions: 1. Have you seen any other providers since your last visit? NA 2. Any changes in your medicines or health? No   Chief Complaint/ HPI   Debbie Marshall,  74 y.o. , female presents for their Initial CCM visit with the clinical pharmacist In office.  PCP : Debbie Medin, MD  Their chronic conditions include: Hypothyroidism, DM,, osteopenia, vitamin D deficiency, HLD  Office Visits: 09/06/2019- Debbie Ace, MD- Patient presented for office visit for follow up.  Patient to obtain following labs for further assessment: TSH, T4 free, BMP, A1c, lipid panel, microalbumin/ creatinine urine ratio, hepatic function panel, CBC.   04/16/2019- Debbie Littler, MD- Patient presented for office visit for low back pain with right radiculitis symptoms. Patient to obtain MRI for further assesessment.   Consult Visit: 05/06/2019- Orthopedic surgery- Debbie Marshall- Patient presented for office visit for low back pain. Unable to access notes.   Medications: Outpatient Encounter Medications as of 10/03/2019  Medication Sig  . Ascorbic Acid (VITAMIN C) 500 MG CAPS Take 1 capsule by mouth daily.  . Blood Glucose Monitoring Suppl (ACCU-CHEK AVIVA CONNECT) w/Device KIT 1 kit by Does not apply route 2 (two) times daily.  . Cholecalciferol 1000 UNITS capsule Take 1 capsule (1,000 Units total) by mouth daily.  Marland Kitchen glucose blood (ACCU-CHEK AVIVA PLUS) test strip Test twice daily.  Elmore Guise Devices (ACCU-CHEK SOFTCLIX) lancets Test twice daily.  . Lancets (ACCU-CHEK SOFT TOUCH) lancets Use as instructed  . levothyroxine (SYNTHROID) 75 MCG tablet NEW INSTRUCTIONS--Take 1 tablet only 6 days a week. Monday through Saturday. Do not take on Sunday.  . metFORMIN (GLUCOPHAGE-XR) 500 MG 24 hr tablet Take 2 tablets (1,000 mg total) by mouth daily. (Patient taking differently: Take 500 mg by mouth daily. )  . MULTIPLE VITAMIN PO  Take 1 capsule by mouth daily.  . Omega-3 Fatty Acids (FISH OIL) 435 MG CAPS Take 1 capsule by mouth in the morning and at bedtime.   . simvastatin (ZOCOR) 40 MG tablet TAKE 1 TABLET BY MOUTH AT  BEDTIME  . fluticasone (FLONASE) 50 MCG/ACT nasal spray Place 2 sprays into both nostrils daily. (Patient not taking: Reported on 10/04/2019)   No facility-administered encounter medications on file as of 10/03/2019.    Current Diagnosis/Assessment:  Goals Addressed            This Visit's Progress   . Pharmacy Care Plan       CARE PLAN ENTRY (see longitudinal plan of care for additional care plan information)  Current Barriers:  . Chronic Disease Management support, education, and care coordination needs related to Hyperlipidemia, Diabetes, Hypothyroidism, and Osteopenia  Hyperlipidemia Lab Results  Component Value Date/Time   LDLCALC 93 09/06/2019 10:16 AM   LDLDIRECT 163.6 10/23/2006 10:02 AM   . Pharmacist Clinical Goal(s): o Over the next 180 days, patient will work with PharmD and providers to maintain LDL goal < 100 . Current regimen:  . Simvastatin 40m, 1 tablet at bedtime  . Omega-3 fatty acids 10072m  1 capsule in the morning and at bedtime  . Interventions: o We discussed:  diet and exercise extensively  . How to reduce cholesterol through diet/weight management and physical activity.    . We discussed how a diet high in plant sterols (fruits/vegetables/nuts/whole grains/legumes) may reduce your cholesterol.  Encouraged increasing fiber to a daily intake of 10-25g/day . Patient self care activities - Over the next  180 days, patient will: o Continue current medications and work on lifestyle modifications (diet and exercise).   Diabetes Lab Results  Component Value Date/Time   HGBA1C 6.4 09/06/2019 10:16 AM   HGBA1C 6.6 (H) 01/25/2019 10:46 AM   . Pharmacist Clinical Goal(s): o Over the next 180 days, patient will work with PharmD and providers to maintain A1c goal  <7% . Current regimen:  o Metformin ER 527m, 1 tablet once daily  . Interventions: o We discussed: diet and exercise extensively (see above). . Patient self care activities - Over the next 180 days, patient will: o Check blood sugar twice daily, document, and provide at future appointments  Hypothryoidism . Pharmacist Clinical Goal(s) o Over the next 180 days, patient will work with PharmD and providers to acheive TSH: 0.35- 4.50 uIU/mL . Current regimen:  o Levothyroxine 735m, 1 tablet six days a week (do not take on Sunday)  . Interventions: o We discussed:  administration time of levothyroxine (at least 30 minutes before first meal and other medications . Patient self care activities - Over the next 180 days, patient will: o Continue current medication as instructed..   Osteopenia  . Pharmacist Clinical Goal(s) o Over the next 180 days, patient will work with PharmD and providers to maintain bone health and decrease risk of fractures.  . Current regimen:  o Vitamin D3 (cholecalciferol) 1000 units, 1 capsule once daily . Interventions: o Recommend 1200 mg of calcium daily from dietary and supplemental sources. Recommend weight-bearing and muscle strengthening exercises for building and maintaining bone density. . Patient self care activities o Patient will continue current medications and exercises.   Medication management . Pharmacist Clinical Goal(s): o Over the next 180 days, patient will work with PharmD and providers to achieve optimal medication adherence . Current pharmacy: [facility] . Interventions o Comprehensive medication review performed. o Continue current medication management strategy o Recommend pill box . Patient self care activities - Over the next 180 days, patient will: o Focus on medication adherence by using pill box to help remember taking medications.  o Take medications as prescribed o Report any questions or concerns to PharmD and/or  provider(s)  Initial goal documentation        SDOH Interventions     Most Recent Value  SDOH Interventions  Financial Strain Interventions Intervention Not Indicated  Transportation Interventions Intervention Not Indicated      Diabetes   Patient denies increasing metformin dose to 2 tabs daily as previously instructed. She opted to improve diet and exercise to improve A1c.    Recent Relevant Labs: Lab Results  Component Value Date/Time   HGBA1C 6.4 09/06/2019 10:16 AM   HGBA1C 6.6 (H) 01/25/2019 10:46 AM   MICROALBUR 1.2 09/06/2019 10:16 AM   MICROALBUR <0.7 06/21/2018 10:26 AM    Checking BG: 2x per Day  Recent BG Readings:103-105  Patient has failed these meds in past: none   Patient is currently controlled on the following medications:   Metformin ER 50028m1 tablet once daily   Last diabetic Eye exam:  Lab Results  Component Value Date/Time   HMDIABEYEEXA No Retinopathy 11/19/2014 12:00 AM    Last diabetic Foot exam: No results found for: HMDIABFOOTEX   We discussed: diet and exercise extensively  Plan Continue current medications  Requesting updated RX for metformin with once daily sig.   Hyperlipidemia   LDL goal < 100  Lipid Panel     Component Value Date/Time   CHOL 176 09/06/2019 1016  TRIG 144.0 09/06/2019 1016   HDL 54.60 09/06/2019 1016   LDLCALC 93 09/06/2019 1016   LDLDIRECT 163.6 10/23/2006 1002    Hepatic Function Latest Ref Rng & Units 09/06/2019 01/25/2019 12/22/2017  Total Protein 6.0 - 8.3 g/dL 7.1 6.6 6.4  Albumin 3.5 - 5.2 g/dL 4.6 4.5 4.2  AST 0 - 37 U/L _0 ALT 0 - 35 U/L _1 Alk Phosphatase 39 - 117 U/L 72 56 48  Total Bilirubin 0.2 - 1.2 mg/dL 0.6 0.8 0.6  Bilirubin, Direct 0.0 - 0.3 mg/dL 0.1 0.2 0.1     The 10-year ASCVD risk score Mikey Bussing DC Jr., et al., 2013) is: 20.4%   Values used to calculate the score:     Age: 33 years     Sex: Female     Is Non-Hispanic African American: No     Diabetic: Yes      Tobacco smoker: No     Systolic Blood Pressure: 841 mmHg     Is BP treated: No     HDL Cholesterol: 54.6 mg/dL     Total Cholesterol: 176 mg/dL   Patient has failed these meds in past: none  Patient is currently controlled on the following medications:  . Simvastatin 77m, 1 tablet at bedtime  . Omega-3 fatty acids 10079m  1 capsule in the morning and at bedtime   We discussed:  diet and exercise extensively  . How to reduce cholesterol through diet/weight management and physical activity.    . We discussed how a diet high in plant sterols (fruits/vegetables/nuts/whole grains/legumes) may reduce your cholesterol.  Encouraged increasing fiber to a daily intake of 10-25g/day   Plan Continue current medications  Hypothyroidism  Patient reported taking levothyroxine about 15 minutes before breakfast and recently underwent change of Synthroid to generic levothyroxine (due to cost).    Lab Results  Component Value Date/Time   TSH 4.69 (H) 09/06/2019 10:16 AM   TSH 0.34 (L) 02/26/2019 02:51 PM   TSH 0.08 (L) 01/25/2019 10:46 AM    Patient has failed these meds in past: none   Patient is currently uncontrolled on the following medications:  . Levothyroxine 7574m 1 tablet six days a week (do not take on Sunday)   We discussed:  administration time of levothyroxine (at least 30 minutes before first meal and other medications).   Plan Recommended patient to schedule lab visit at end of August or September for repeat TSH (3 to 4 months after last TSH).  Continue current medications  Osteopenia/ vitamin D deficiency   Patient did not recall more recent  DEXA scan. She reports getting dairy in milk, cheese, yogurt. And is planning on including weight bearing exercises.   Last DEXA Scan: 11/01/2012   T-Score femoral neck: LFN and RFN: -1.7   VITD  Date Value Ref Range Status  05/06/2015 24.76 (L) 30.00 - 100.00 ng/mL Final    Patient is not a candidate for pharmacologic  treatment  Patient is currently controlled on the following medications:   Vitamin D3 (cholecalciferol 1000 units, 1 capsule once daily   We discussed:  Recommend 1200 mg of calcium daily from dietary and supplemental sources. Recommend weight-bearing and muscle strengthening exercises for building and maintaining bone density.  Plan Recommended DEXA scan every 3 to 5 years.  Recommend repeat vitamin D level  Continue current medications  Allergic rhinitis    Patient has failed these meds in past: none  Patient is currently controlled on  the following medications:  . Fluticasone (Flonase) nasal spray, 2 sprays into both nostrils once daily (current not using)   Plan Continue current medications  OTC/ Supplements    Patient is currently on the following medications:   Vitamin C 521m, 1 capsule once daily  Multivitamin, 1 capsule once daily  Plan Continue current medications   Medication Management  Patient organizes medications: patient reports having own system. Keeps medications in pill bottles and endorses missing doses at least 1-2x/ month.   Recommended use of pill box  Primary pharmacy: [facility] Adherence:  - metformin (no recent fill for year 2021)- patient decreased dose to once daily (needed updated prescription sent to [facility].     Follow up Follow up visit with PharmD in 6 months  AAnson Crofts PharmD Clinical Pharmacist [facility]([phone]

## 2019-10-04 NOTE — Patient Instructions (Addendum)
Visit Information  Goals Addressed            This Visit's Progress   . Pharmacy Care Plan       CARE PLAN ENTRY (see longitudinal plan of care for additional care plan information)  Current Barriers:  . Chronic Disease Management support, education, and care coordination needs related to Hyperlipidemia, Diabetes, Hypothyroidism, and Osteopenia  Hyperlipidemia Lab Results  Component Value Date/Time   LDLCALC 93 09/06/2019 10:16 AM   LDLDIRECT 163.6 10/23/2006 10:02 AM   . Pharmacist Clinical Goal(s): o Over the next 180 days, patient will work with PharmD and providers to maintain LDL goal < 100 . Current regimen:  . Simvastatin 40mg , 1 tablet at bedtime  . Omega-3 fatty acids 1000mg ,  1 capsule in the morning and at bedtime  . Interventions: o We discussed:  diet and exercise extensively  . How to reduce cholesterol through diet/weight management and physical activity.    . We discussed how a diet high in plant sterols (fruits/vegetables/nuts/whole grains/legumes) may reduce your cholesterol.  Encouraged increasing fiber to a daily intake of 10-25g/day . Patient self care activities - Over the next 180 days, patient will: o Continue current medications and work on lifestyle modifications (diet and exercise).   Diabetes Lab Results  Component Value Date/Time   HGBA1C 6.4 09/06/2019 10:16 AM   HGBA1C 6.6 (H) 01/25/2019 10:46 AM   . Pharmacist Clinical Goal(s): o Over the next 180 days, patient will work with PharmD and providers to maintain A1c goal <7% . Current regimen:  o Metformin ER 500mg , 1 tablet once daily  . Interventions: o We discussed: diet and exercise extensively (see above). . Patient self care activities - Over the next 180 days, patient will: o Check blood sugar twice daily, document, and provide at future appointments  Hypothryoidism . Pharmacist Clinical Goal(s) o Over the next 180 days, patient will work with PharmD and providers to acheive TSH:  0.35- 4.50 uIU/mL . Current regimen:  o Levothyroxine 80mcg, 1 tablet six days a week (do not take on Sunday)  . Interventions: o We discussed:  administration time of levothyroxine (at least 30 minutes before first meal and other medications . Patient self care activities - Over the next 180 days, patient will: o Continue current medication as instructed..   Osteopenia  . Pharmacist Clinical Goal(s) o Over the next 180 days, patient will work with PharmD and providers to maintain bone health and decrease risk of fractures.  . Current regimen:  o Vitamin D3 (cholecalciferol) 1000 units, 1 capsule once daily . Interventions: o Recommend 1200 mg of calcium daily from dietary and supplemental sources. Recommend weight-bearing and muscle strengthening exercises for building and maintaining bone density. . Patient self care activities o Patient will continue current medications and exercises.   Medication management . Pharmacist Clinical Goal(s): o Over the next 180 days, patient will work with PharmD and providers to achieve optimal medication adherence . Current pharmacy: [facility] . Interventions o Comprehensive medication review performed. o Continue current medication management strategy o Recommend pill box . Patient self care activities - Over the next 180 days, patient will: o Focus on medication adherence by using pill box to help remember taking medications.  o Take medications as prescribed o Report any questions or concerns to PharmD and/or provider(s)  Initial goal documentation        Ms. Popowski was given information about Chronic Care Management services today including:  1. CCM service includes personalized support  from designated clinical staff supervised by her physician, including individualized plan of care and coordination with other care providers 2. 24/7 contact phone numbers for assistance for urgent and routine care needs. 3. Standard insurance, coinsurance,  copays and deductibles apply for chronic care management only during months in which we provide at least 20 minutes of these services. Most insurances cover these services at 100%, however patients may be responsible for any copay, coinsurance and/or deductible if applicable. This service may help you avoid the need for more expensive face-to-face services. 4. Only one practitioner may furnish and bill the service in a calendar month. 5. The patient may stop CCM services at any time (effective at the end of the month) by phone call to the office staff.  Patient agreed to services and verbal consent obtained.   The patient verbalized understanding of instructions provided today and agreed to receive a mailed copy of patient instruction and/or educational materials. Telephone follow up appointment with pharmacy team member scheduled for: 03/25/2020  Anson Crofts, PharmD Clinical Pharmacist [facility] [place] [phone]    Hypothyroidism  Hypothyroidism is when the thyroid gland does not make enough of certain hormones (it is underactive). The thyroid gland is a small gland located in the lower front part of the neck, just in front of the windpipe (trachea). This gland makes hormones that help control how the body uses food for energy (metabolism) as well as how the heart and brain function. These hormones also play a role in keeping your bones strong. When the thyroid is underactive, it produces too little of the hormones thyroxine (T4) and triiodothyronine (T3). What are the causes? This condition may be caused by:  Hashimoto's disease. This is a disease in which the body's disease-fighting system (immune system) attacks the thyroid gland. This is the most common cause.  Viral infections.  Pregnancy.  Certain medicines.  Birth defects.  Past radiation treatments to the head or neck for cancer.  Past treatment with radioactive iodine.  Past exposure to  radiation in the environment.  Past surgical removal of part or all of the thyroid.  Problems with a gland in the center of the brain (pituitary gland).  Lack of enough iodine in the diet. What increases the risk? You are more likely to develop this condition if:  You are female.  You have a family history of thyroid conditions.  You use a medicine called lithium.  You take medicines that affect the immune system (immunosuppressants). What are the signs or symptoms? Symptoms of this condition include:  Feeling as though you have no energy (lethargy).  Not being able to tolerate cold.  Weight gain that is not explained by a change in diet or exercise habits.  Lack of appetite.  Dry skin.  Coarse hair.  Menstrual irregularity.  Slowing of thought processes.  Constipation.  Sadness or depression. How is this diagnosed? This condition may be diagnosed based on:  Your symptoms, your medical history, and a physical exam.  Blood tests. You may also have imaging tests, such as an ultrasound or MRI. How is this treated? This condition is treated with medicine that replaces the thyroid hormones that your body does not make. After you begin treatment, it may take several weeks for symptoms to go away. Follow these instructions at home:  Take over-the-counter and prescription medicines only as told by your health care provider.  If you start taking any new medicines, tell your health care provider.  Keep all follow-up visits as told by your health care provider. This is important. ? As your condition improves, your dosage of thyroid hormone medicine may change. ? You will need to have blood tests regularly so that your health care provider can monitor your condition. Contact a health care provider if:  Your symptoms do not get better with treatment.  You are taking thyroid replacement medicine and you: ? Sweat a lot. ? Have tremors. ? Feel anxious. ? Lose weight  rapidly. ? Cannot tolerate heat. ? Have emotional swings. ? Have diarrhea. ? Feel weak. Get help right away if you have:  Chest pain.  An irregular heartbeat.  A rapid heartbeat.  Difficulty breathing. Summary  Hypothyroidism is when the thyroid gland does not make enough of certain hormones (it is underactive).  When the thyroid is underactive, it produces too little of the hormones thyroxine (T4) and triiodothyronine (T3).  The most common cause is Hashimoto's disease, a disease in which the body's disease-fighting system (immune system) attacks the thyroid gland. The condition can also be caused by viral infections, medicine, pregnancy, or past radiation treatment to the head or neck.  Symptoms may include weight gain, dry skin, constipation, feeling as though you do not have energy, and not being able to tolerate cold.  This condition is treated with medicine to replace the thyroid hormones that your body does not make. This information is not intended to replace advice given to you by your health care provider. Make sure you discuss any questions you have with your health care provider. Document Revised: 03/10/2017 Document Reviewed: 03/08/2017 Elsevier Patient Education  2020 [employer].

## 2019-11-29 ENCOUNTER — Other Ambulatory Visit: Payer: Self-pay

## 2019-11-29 ENCOUNTER — Other Ambulatory Visit: Payer: Medicare Other

## 2019-11-29 DIAGNOSIS — E039 Hypothyroidism, unspecified: Secondary | ICD-10-CM | POA: Diagnosis not present

## 2019-11-29 LAB — TSH: TSH: 4.26 mIU/L (ref 0.40–4.50)

## 2019-11-30 NOTE — Progress Notes (Signed)
Acceptable range of tsh  Continue  taking 6 days  per week

## 2019-12-03 ENCOUNTER — Telehealth (INDEPENDENT_AMBULATORY_CARE_PROVIDER_SITE_OTHER): Payer: Medicare Other | Admitting: Internal Medicine

## 2019-12-03 ENCOUNTER — Encounter: Payer: Self-pay | Admitting: Internal Medicine

## 2019-12-03 ENCOUNTER — Other Ambulatory Visit: Payer: Self-pay

## 2019-12-03 VITALS — Ht 59.4 in | Wt 120.0 lb

## 2019-12-03 DIAGNOSIS — Z8709 Personal history of other diseases of the respiratory system: Secondary | ICD-10-CM

## 2019-12-03 DIAGNOSIS — R0981 Nasal congestion: Secondary | ICD-10-CM

## 2019-12-03 DIAGNOSIS — J329 Chronic sinusitis, unspecified: Secondary | ICD-10-CM | POA: Diagnosis not present

## 2019-12-03 MED ORDER — PREDNISONE 20 MG PO TABS: 20.0000 mg | ORAL_TABLET | Freq: Two times a day (BID) | ORAL | 0 refills | Status: DC

## 2019-12-03 NOTE — Progress Notes (Signed)
Virtual Visit via Video Note  I connected with@ on 12/03/19 at  3:30 PM EDT by a video enabled telemedicine application and verified that I am speaking with the correct person using two identifiers. Location patient: home Location provider:workoffice Persons participating in the virtual visit: patient, provider  [place] national recommendations  regarding COVID 19 pandemic   video visit is advised over in office visit for this patient.  Patient aware  of the limitations of evaluation and management by telemedicine and  availability of in person appointments. and agreed to proceed.   HPI: Debbie Marshall presents for video visit  For new nasal congestion  Severe with some sinus pressure progressing upt to 10 days   No sig cough or fever  Body aches or exposures    allergy and polyps have  Sx similar and t hinks that steroids will work .  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 She had covid vaccine. ROS: See pertinent positives and negatives per HPI.  Past Medical History:  Diagnosis Date  . ABNORMAL EXAM-BILIARY TRACT 11/14/2008  . ABNORMAL TRANSAMINASE, (LFT'S) 11/14/2008    2005 US shows fatty liver  . Cancer ([place]) 1996   tongue cancer  . Diabetes mellitus without complication ([place])   . FASTING HYPERGLYCEMIA 09/07/2006  . GERD 09/07/2006  . HYPERLIPIDEMIA 09/07/2006  . HYPOTHYROIDISM 10/30/2006  . Nonspecific abnormal results of liver function study 09/07/2006  . Normal nuclear stress test 2005   stress cardiolite   . OSTEOARTHRITIS 09/07/2006  . Positive PPD    [place] vaccine [place]- shows positive after receiving  . 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 VITAMIN D DEFICIENCY 08/14/2009    Past Surgical History:  Procedure Laterality Date  . COLONOSCOPY    . [place]   Tumor removed on back of tongue and salivary gland    Family History  Problem Relation Age of Onset  . Stroke Mother 47  . Arthritis Mother    . Rheum arthritis Brother   . Arthritis Brother   . Gout Brother   . Colon cancer Other   . Esophageal cancer Neg Hx   . Stomach cancer Neg Hx   . Rectal cancer Neg Hx     Social History   Tobacco Use  . Smoking status: Never Smoker  . Smokeless tobacco: Never Used  Vaping Use  . Vaping Use: Never used  Substance Use Topics  . Alcohol use: Yes    Alcohol/week: 1.0 standard drink    Types: 1 Glasses of wine per week    Comment: rarely  . Drug use: No      Current Outpatient Medications:  .  Ascorbic Acid (VITAMIN C) 500 MG CAPS, Take 1 capsule by mouth daily., Disp: , Rfl:  .  Blood Glucose Monitoring Suppl (ACCU-CHEK AVIVA CONNECT) w/Device KIT, 1 kit by Does not apply route 2 (two) times daily., Disp: 1 kit, Rfl: 0 .  Cholecalciferol 1000 UNITS capsule, Take 1 capsule (1,000 Units total) by mouth daily., Disp: 90 capsule, Rfl: 3 .  fluticasone (FLONASE) 50 MCG/ACT nasal spray, Place 2 sprays into both nostrils daily., Disp: 16 g, Rfl: 2 .  glucose blood (ACCU-CHEK AVIVA PLUS) test strip, Test twice daily., Disp: 100 each, Rfl: 12 .  Lancet Devices (ACCU-CHEK SOFTCLIX) lancets, Test twice daily., Disp: 1 each, Rfl: 12 .  Lancets (ACCU-CHEK SOFT TOUCH) lancets, Use as instructed, Disp:  100 each, Rfl: 12 .  levothyroxine (SYNTHROID) 75 MCG tablet, NEW INSTRUCTIONS--Take 1 tablet only 6 days a week. Monday through Saturday. Do not take on Sunday., Disp: 90 tablet, Rfl: 0 .  metFORMIN (GLUCOPHAGE-XR) 500 MG 24 hr tablet, Take 2 tablets (1,000 mg total) by mouth daily. (Patient taking differently: Take 500 mg by mouth daily. ), Disp: 180 tablet, Rfl: 0 .  MULTIPLE VITAMIN PO, Take 1 capsule by mouth daily., Disp: , Rfl:  .  Omega-3 Fatty Acids (FISH OIL) 435 MG CAPS, Take 1 capsule by mouth in the morning and at bedtime. , Disp: , Rfl:  .  simvastatin (ZOCOR) 40 MG tablet, TAKE 1 TABLET BY MOUTH AT  BEDTIME, Disp: 90 tablet, Rfl: 3 .  predniSONE (DELTASONE) 20 MG tablet, Take 1  tablet (20 mg total) by mouth 2 (two) times daily., Disp: 10 tablet, Rfl: 0  EXAM: BP Readings from Last 3 Encounters:  09/06/19 126/84  04/16/19 118/72  01/25/19 138/80    VITALS per patient if applicable:  GENERAL: alert, oriented, appears well and in no acute distress very nasally congested but looks well and no cough resp distress during visit  HEENT: atraumatic, conjunttiva clear, no obvious abnormalities on inspection of external nose and ears no edema NECK: normal movements of the head and neck LUNGS: on inspection no signs of respiratory distress, breathing rate appears normal, no obvious gross SOB, gasping or wheezing CV: no obvious cyanosis PSYCH/NEURO: pleasant and cooperative, no obvious depression or anxiety, speech and thought processing grossly intact Lab Results  Component Value Date   WBC 6.9 09/06/2019   HGB 14.5 09/06/2019   HCT 43.4 09/06/2019   PLT 221.0 09/06/2019   GLUCOSE 104 (H) 09/06/2019   CHOL 176 09/06/2019   TRIG 144.0 09/06/2019   HDL 54.60 09/06/2019   LDLDIRECT 163.6 10/23/2006   LDLCALC 93 09/06/2019   ALT 15 09/06/2019   AST 20 09/06/2019   NA 140 09/06/2019   K 4.5 09/06/2019   CL 101 09/06/2019   CREATININE 0.68 09/06/2019   BUN 16 09/06/2019   CO2 30 09/06/2019   TSH 4.26 11/29/2019   INR 1.0 ratio 11/14/2008   HGBA1C 6.4 09/06/2019   MICROALBUR 1.2 09/06/2019    ASSESSMENT AND PLAN:  Discussed the following assessment and plan:    ICD-10-CM   1. Nasal congestion  R09.81   2. Sinusitis, unspecified chronicity, unspecified location  J32.9   3. History of nasal polyp  Z87.09    Severe nasal congestion with hx of same and nasal polyps     Has responded to steroids in past  And says feels the same   No systemic sx   So reasonable to treat as such  Counseled.   Encouraged to get covid tested  As sx are consistent with break through  Infection albeit mild, she has risk and her elderly father does also   Expectant management and  discussion of plan and treatment with opportunity to ask questions and all were answered. The patient agreed with the plan and demonstrated an understanding of the instructions.   Advised to call back or seek an in-person evaluation if worsening  or having  further concerns . Return if symptoms worsen or fail to improve as expected.   Shanon Ace, MD

## 2019-12-18 ENCOUNTER — Other Ambulatory Visit: Payer: Self-pay | Admitting: Internal Medicine

## 2020-01-03 ENCOUNTER — Encounter: Payer: Self-pay | Admitting: Internal Medicine

## 2020-01-03 ENCOUNTER — Other Ambulatory Visit: Payer: Self-pay

## 2020-01-03 ENCOUNTER — Telehealth: Payer: Self-pay | Admitting: Internal Medicine

## 2020-01-03 ENCOUNTER — Telehealth (INDEPENDENT_AMBULATORY_CARE_PROVIDER_SITE_OTHER): Payer: Medicare Other | Admitting: Internal Medicine

## 2020-01-03 VITALS — Ht 59.5 in | Wt 120.0 lb

## 2020-01-03 DIAGNOSIS — J988 Other specified respiratory disorders: Secondary | ICD-10-CM

## 2020-01-03 DIAGNOSIS — R0981 Nasal congestion: Secondary | ICD-10-CM | POA: Diagnosis not present

## 2020-01-03 DIAGNOSIS — Z8709 Personal history of other diseases of the respiratory system: Secondary | ICD-10-CM

## 2020-01-03 DIAGNOSIS — J329 Chronic sinusitis, unspecified: Secondary | ICD-10-CM

## 2020-01-03 DIAGNOSIS — J398 Other specified diseases of upper respiratory tract: Secondary | ICD-10-CM

## 2020-01-03 MED ORDER — PREDNISONE 20 MG PO TABS: 20.0000 mg | ORAL_TABLET | Freq: Two times a day (BID) | ORAL | 0 refills | Status: DC

## 2020-01-03 MED ORDER — AMOXICILLIN-POT CLAVULANATE 875-125 MG PO TABS: 1.0000 | ORAL_TABLET | Freq: Two times a day (BID) | ORAL | 0 refills | Status: DC

## 2020-01-03 NOTE — Telephone Encounter (Signed)
Need  Visit  Add on virtual today. End of schedule

## 2020-01-03 NOTE — Telephone Encounter (Signed)
Called patient and scheduled a virtual visit today at 3:30pm. Patient verbalized an understanding.

## 2020-01-03 NOTE — Progress Notes (Signed)
Virtual Visit via Video Note  I connected with@ on 01/03/20 at  3:30 PM EDT by a video enabled telemedicine application and verified that I am speaking with the correct person using two identifiers. Location patient: home Location provider:work  office Persons participating in the virtual visit: patient, provider  [place] national recommendations  regarding COVID 19 pandemic   video visit is advised over in office visit for this patient.  Patient aware  of the limitations of evaluation and management by telemedicine and  availability of in person appointments. and agreed to proceed.   HPI: Debbie Marshall presents for video visit  See messages    Add on  sda viist   Before weekend Got better from the 12/03/2019 visit after 5 days of prednisone 3 to 4 days ago began having nasal congestion and some sneezing like her allergies but feeling like she is going to begin a cough. This is similar to what she gets with her allergies or sinus problems.  There is no fever does not wanted to get as bad as it did a while back. No known recent exposures right nasal congestion is worse than the left she is going to follow-up with ENT. She still taking care of her elderly father. Does not want to go to the weekend and get bad.  Drainage is currently white.  No shortness of breath.    ROS: See pertinent positives and negatives per HPI.  Past Medical History:  Diagnosis Date  . ABNORMAL EXAM-BILIARY TRACT 11/14/2008  . ABNORMAL TRANSAMINASE, (LFT'S) 11/14/2008    2005 US shows fatty liver  . Cancer ([place]) 1996   tongue cancer  . Diabetes mellitus without complication ([place])   . FASTING HYPERGLYCEMIA 09/07/2006  . GERD 09/07/2006  . HYPERLIPIDEMIA 09/07/2006  . HYPOTHYROIDISM 10/30/2006  . Nonspecific abnormal results of liver function study 09/07/2006  . Normal nuclear stress test 2005   stress cardiolite   . OSTEOARTHRITIS 09/07/2006  . Positive PPD    [place] vaccine [place]- shows positive after  receiving  . 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 VITAMIN D DEFICIENCY 08/14/2009    Past Surgical History:  Procedure Laterality Date  . COLONOSCOPY    . [place]   Tumor removed on back of tongue and salivary gland    Family History  Problem Relation Age of Onset  . Stroke Mother 16  . Arthritis Mother   . Rheum arthritis Brother   . Arthritis Brother   . Gout Brother   . Colon cancer Other   . Esophageal cancer Neg Hx   . Stomach cancer Neg Hx   . Rectal cancer Neg Hx     Social History   Tobacco Use  . Smoking status: Never Smoker  . Smokeless tobacco: Never Used  Vaping Use  . Vaping Use: Never used  Substance Use Topics  . Alcohol use: Yes    Alcohol/week: 1.0 standard drink    Types: 1 Glasses of wine per week    Comment: rarely  . Drug use: No      Current Outpatient Medications:  .  Ascorbic Acid (VITAMIN C) 500 MG CAPS, Take 1 capsule by mouth daily., Disp: , Rfl:  .  Blood Glucose Monitoring Suppl (ACCU-CHEK GUIDE) w/Device KIT, USE TO CHECK BLOOD SUGAR  TWICE DAILY, Disp: 1 kit, Rfl: 1 .  Cholecalciferol 1000 UNITS capsule, Take 1 capsule (1,000 Units total) by  mouth daily., Disp: 90 capsule, Rfl: 3 .  fluticasone (FLONASE) 50 MCG/ACT nasal spray, Place 2 sprays into both nostrils daily., Disp: 16 g, Rfl: 2 .  glucose blood (ACCU-CHEK AVIVA PLUS) test strip, Test twice daily., Disp: 100 each, Rfl: 12 .  Lancet Devices (ACCU-CHEK SOFTCLIX) lancets, Test twice daily., Disp: 1 each, Rfl: 12 .  Lancets (ACCU-CHEK SOFT TOUCH) lancets, Use as instructed, Disp: 100 each, Rfl: 12 .  levothyroxine (SYNTHROID) 75 MCG tablet, NEW INSTRUCTIONS--Take 1 tablet only 6 days a week. Monday through Saturday. Do not take on Sunday., Disp: 90 tablet, Rfl: 0 .  metFORMIN (GLUCOPHAGE-XR) 500 MG 24 hr tablet, TAKE 2 TABLETS BY MOUTH  DAILY, Disp: 180 tablet, Rfl: 1 .  MULTIPLE VITAMIN PO, Take 1 capsule by mouth daily., Disp:  , Rfl:  .  Omega-3 Fatty Acids (FISH OIL) 435 MG CAPS, Take 1 capsule by mouth in the morning and at bedtime. , Disp: , Rfl:  .  predniSONE (DELTASONE) 20 MG tablet, Take 1 tablet (20 mg total) by mouth 2 (two) times daily., Disp: 10 tablet, Rfl: 0 .  simvastatin (ZOCOR) 40 MG tablet, TAKE 1 TABLET BY MOUTH AT  BEDTIME, Disp: 90 tablet, Rfl: 3 .  amoxicillin-clavulanate (AUGMENTIN) 875-125 MG tablet, Take 1 tablet by mouth every 12 (twelve) hours. If needed for sinusitis, Disp: 14 tablet, Rfl: 0  EXAM: BP Readings from Last 3 Encounters:  09/06/19 126/84  04/16/19 118/72  01/25/19 138/80    VITALS per patient if applicable:  GENERAL: alert, oriented, appears well and in no acute distress moderately congested nontoxic normal speech no cough during exam.  HEENT: atraumatic, conjunttiva clear, no obvious abnormalities on inspection of external nose and ears  NECK: normal movements of the head and neck  LUNGS: on inspection no signs of respiratory distress, breathing rate appears normal, no obvious gross SOB, gasping or wheezing  CV: no obvious cyanosis  MS: moves all visible extremities without noticeable abnormality  PSYCH/NEURO: pleasant and cooperative, no obvious depression or anxiety, speech and thought processing grossly intact   ASSESSMENT AND PLAN:  Discussed the following assessment and plan:    ICD-10-CM   1. Congestion of upper respiratory tract  J98.8   2. Recurrent sinusitis  J32.9   3. History of nasal polyp  Z87.09   4. Nasal congestion  R09.81    History of recurrent sinusitis and nasal polyps. In past responded to prednisone in addition to physical measures. This is a new illness discussed SarsCOv2  delta can present like allergies or sinus problems so advise getting tested anyway especially since she care takes an elderly father. In the meantime we will send in prednisone for 5 days if drainage becomes infected looking she can begin antibiotic. Follow-up if  persistent progressive and do agree with following up with specialist.  Consider allergy evaluation. Patient aware Counseled.   Expectant management and discussion of plan and treatment with opportunity to ask questions and all were answered. The patient agreed with the plan and demonstrated an understanding of the instructions.   Advised to call back or seek an in-person evaluation if worsening  or having  further concerns . Return if symptoms worsen or fail to improve as expected. I   Shanon Ace, MD

## 2020-01-03 NOTE — Telephone Encounter (Signed)
Patient is requesting an antibiotic and an inhaler because she does not want to get bronchitis and she is feeling like she could be getting an infection possibly. Please advise if she needs to be on schedule or if you can send?

## 2020-01-03 NOTE — Telephone Encounter (Signed)
If she  Doesn't have fever and severe pain like infection  Ok to refill the prednisone   I think she should get back with ENT  .

## 2020-01-03 NOTE — Telephone Encounter (Signed)
Called patient and she stated that she improved on her nasal congestion and it has started again and her allergies came back and her congestion is more severe and getting worse. Has had this happen before and she does not want bronchitis because she is just now starting to cough.  Please advise if OK to fill?

## 2020-01-03 NOTE — Telephone Encounter (Signed)
Pt is calling to see if she can get a refill on a medication   predniSONE (DELTASONE) 20 MG tablet   [id number] [address], [place] [address], [place] [address]  Phone:  [phone] Fax:  [phone]   Please advise

## 2020-01-06 ENCOUNTER — Telehealth (INDEPENDENT_AMBULATORY_CARE_PROVIDER_SITE_OTHER): Payer: Medicare Other | Admitting: Family Medicine

## 2020-01-06 ENCOUNTER — Other Ambulatory Visit: Payer: Self-pay

## 2020-01-06 ENCOUNTER — Encounter: Payer: Self-pay | Admitting: Family Medicine

## 2020-01-06 VITALS — BP 115/59 | Ht 59.5 in | Wt 120.0 lb

## 2020-01-06 DIAGNOSIS — R0981 Nasal congestion: Secondary | ICD-10-CM | POA: Diagnosis not present

## 2020-01-06 NOTE — Progress Notes (Signed)
Patient ID: Debbie Marshall, female   DOB: 29-Aug-1945, 74 y.o.   MRN: [id number]  This visit type was conducted due to national recommendations for restrictions regarding the COVID-19 pandemic in an effort to limit this patient's exposure and mitigate transmission in our community.   Virtual Visit via Video Note  I connected with Debbie Marshall on 01/06/20 at 10:45 AM EDT by a video enabled telemedicine application and verified that I am speaking with the correct person using two identifiers.  Location patient: home Location provider:work or home office Persons participating in the virtual visit: patient, provider  I discussed the limitations of evaluation and management by telemedicine and the availability of in person appointments. The patient expressed understanding and agreed to proceed.   HPI: Debbie Marshall is seen in follow-up from recent video visit on the 24th.  She has history of known nasal polyps and allergic rhinitis issues.  She had recently developed some sinus congestion and was started by her primary on prednisone and Augmentin.  She feels better at this time.  She has never had any fever.  No dyspnea.  Her sinus symptoms are improving.  She has had Covid vaccine but no recent Covid testing.  No sick exposures.  She does have known nasal polyps and has pending follow-up with ENT.  She had declined surgery previously.  No major loss of taste or smell.  No significant myalgias.  No nausea, vomiting, or diarrhea.   ROS: See pertinent positives and negatives per HPI.  Past Medical History:  Diagnosis Date  . ABNORMAL EXAM-BILIARY TRACT 11/14/2008  . ABNORMAL TRANSAMINASE, (LFT'S) 11/14/2008    2005 US shows fatty liver  . Cancer ([place]) 1996   tongue cancer  . Diabetes mellitus without complication ([place])   . FASTING HYPERGLYCEMIA 09/07/2006  . GERD 09/07/2006  . HYPERLIPIDEMIA 09/07/2006  . HYPOTHYROIDISM 10/30/2006  . Nonspecific abnormal results of liver function study 09/07/2006  . Normal nuclear  stress test 2005   stress cardiolite   . OSTEOARTHRITIS 09/07/2006  . Positive PPD    [place]- shows positive after receiving  . 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 VITAMIN D DEFICIENCY 08/14/2009    Past Surgical History:  Procedure Laterality Date  . COLONOSCOPY    . [place]   Tumor removed on back of tongue and salivary gland    Family History  Problem Relation Age of Onset  . Stroke Mother 68  . Arthritis Mother   . Rheum arthritis Brother   . Arthritis Brother   . Gout Brother   . Colon cancer Other   . Esophageal cancer Neg Hx   . Stomach cancer Neg Hx   . Rectal cancer Neg Hx     SOCIAL HX: Non-smoker   Current Outpatient Medications:  .  amoxicillin-clavulanate (AUGMENTIN) 875-125 MG tablet, Take 1 tablet by mouth every 12 (twelve) hours. If needed for sinusitis, Disp: 14 tablet, Rfl: 0 .  Ascorbic Acid (VITAMIN C) 500 MG CAPS, Take 1 capsule by mouth daily., Disp: , Rfl:  .  Blood Glucose Monitoring Suppl (ACCU-CHEK GUIDE) w/Device KIT, USE TO CHECK BLOOD SUGAR  TWICE DAILY, Disp: 1 kit, Rfl: 1 .  Cholecalciferol 1000 UNITS capsule, Take 1 capsule (1,000 Units total) by mouth daily., Disp: 90 capsule, Rfl: 3 .  fluticasone (FLONASE) 50 MCG/ACT nasal spray, Place 2 sprays into both nostrils daily., Disp: 16 g, Rfl: 2 .  glucose blood (ACCU-CHEK AVIVA PLUS) test strip, Test twice daily., Disp: 100 each, Rfl: 12 .  Lancet Devices (ACCU-CHEK SOFTCLIX) lancets, Test twice daily., Disp: 1 each, Rfl: 12 .  Lancets (ACCU-CHEK SOFT TOUCH) lancets, Use as instructed, Disp: 100 each, Rfl: 12 .  levothyroxine (SYNTHROID) 75 MCG tablet, NEW INSTRUCTIONS--Take 1 tablet only 6 days a week. Monday through Saturday. Do not take on Sunday., Disp: 90 tablet, Rfl: 0 .  metFORMIN (GLUCOPHAGE-XR) 500 MG 24 hr tablet, TAKE 2 TABLETS BY MOUTH  DAILY, Disp: 180 tablet, Rfl: 1 .  MULTIPLE VITAMIN PO, Take 1 capsule  by mouth daily., Disp: , Rfl:  .  Omega-3 Fatty Acids (FISH OIL) 435 MG CAPS, Take 1 capsule by mouth in the morning and at bedtime. , Disp: , Rfl:  .  predniSONE (DELTASONE) 20 MG tablet, Take 1 tablet (20 mg total) by mouth 2 (two) times daily., Disp: 10 tablet, Rfl: 0 .  simvastatin (ZOCOR) 40 MG tablet, TAKE 1 TABLET BY MOUTH AT  BEDTIME, Disp: 90 tablet, Rfl: 3  EXAM:  VITALS per patient if applicable:  GENERAL: alert, oriented, appears well and in no acute distress  HEENT: atraumatic, conjunttiva clear, no obvious abnormalities on inspection of external nose and ears  NECK: normal movements of the head and neck  LUNGS: on inspection no signs of respiratory distress, breathing rate appears normal, no obvious gross SOB, gasping or wheezing  CV: no obvious cyanosis  MS: moves all visible extremities without noticeable abnormality  PSYCH/NEURO: pleasant and cooperative, no obvious depression or anxiety, speech and thought processing grossly intact  ASSESSMENT AND PLAN:  Discussed the following assessment and plan:  Recent sinusitis symptoms improved currently on Augmentin and prednisone  -Stay hydrated -Follow-up with ENT as scheduled -Follow-up immediately for any fever, increased shortness of breath, or other concerns     I discussed the assessment and treatment plan with the patient. The patient was provided an opportunity to ask questions and all were answered. The patient agreed with the plan and demonstrated an understanding of the instructions.   The patient was advised to call back or seek an in-person evaluation if the symptoms worsen or if the condition fails to improve as anticipated.     Carolann Littler, MD

## 2020-01-29 DIAGNOSIS — Z20822 Contact with and (suspected) exposure to covid-19: Secondary | ICD-10-CM | POA: Diagnosis not present

## 2020-01-30 ENCOUNTER — Ambulatory Visit (INDEPENDENT_AMBULATORY_CARE_PROVIDER_SITE_OTHER): Payer: Medicare Other | Admitting: Otolaryngology

## 2020-02-06 ENCOUNTER — Encounter (INDEPENDENT_AMBULATORY_CARE_PROVIDER_SITE_OTHER): Payer: Self-pay | Admitting: Otolaryngology

## 2020-02-06 ENCOUNTER — Ambulatory Visit (INDEPENDENT_AMBULATORY_CARE_PROVIDER_SITE_OTHER): Payer: Medicare Other | Admitting: Otolaryngology

## 2020-02-06 ENCOUNTER — Other Ambulatory Visit: Payer: Self-pay

## 2020-02-06 VITALS — Temp 97.5°F

## 2020-02-06 DIAGNOSIS — J339 Nasal polyp, unspecified: Secondary | ICD-10-CM | POA: Diagnosis not present

## 2020-02-06 DIAGNOSIS — J310 Chronic rhinitis: Secondary | ICD-10-CM

## 2020-02-06 NOTE — Progress Notes (Signed)
HPI: Debbie Marshall is a 74 y.o. female who returns today for evaluation of nasal polyps.  She has been on steroids 3 times this year.  She has been using nasal steroid sprays but not using it regularly.  She also notices decreased sense of smell.  She presents today to discuss possible surgery.  She has been doing better since taking the steroids which she just completed recently..  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  Dr Lucia Gaskins   VITAMIN D DEFICIENCY 08/14/2009   Past Surgical History:  Procedure Laterality Date   COLONOSCOPY     [place]   Tumor removed on back of tongue and salivary gland   Social History   Socioeconomic History   Marital status: Single    Spouse name: Not on file   Number of children: Not on file   Years of education: Not on file   Highest education level: Not on file  Occupational History   Occupation: PhD Professor  Tobacco Use   Smoking status: Never Smoker   Smokeless tobacco: Never Used  Vaping Use   Vaping Use: Never used  Substance and Sexual Activity   Alcohol use: Yes    Alcohol/week: 1.0 standard drink    Types: 1 Glasses of wine per week    Comment: rarely   Drug use: No   Sexual activity: Not on file  Other Topics Concern   Not on file  Social History Narrative   HH of 2 Father and her    cat     PhD  professor in information systems    No tobacco or alcohol exercising regularly currently   Social Determinants of Health   Financial Resource Strain: Low Risk    Difficulty of Paying Living Expenses: Not hard at all  Food Insecurity:    Worried About Charity fundraiser in the Last Year: Not on file   [employer] of Food in the Last Year: Not on file  Transportation Needs: No Transportation Needs   Lack of Transportation (Medical): No   Lack of Transportation (Non-Medical): No  Physical Activity:    Days of Exercise per Week: Not on file   Minutes of Exercise per Session: Not on file  Stress:    Feeling of Stress : Not on file  Social Connections:    Frequency of Communication with Friends and Family: Not on file   Frequency of Social Gatherings with Friends and Family: Not on file   Attends Religious Services: Not on file   Active Member of Clubs or Organizations: Not on file   Attends Archivist Meetings: Not on file   Marital Status: Not on file   Family History  Problem Relation Age of Onset   Stroke Mother 7   Arthritis Mother    Rheum arthritis Brother  Arthritis Brother    Gout Brother    Colon cancer Other    Esophageal cancer Neg Hx    Stomach cancer Neg Hx    Rectal cancer Neg Hx    No Known Allergies Prior to Admission medications   Medication Sig Start Date End Date Taking? Authorizing Provider  amoxicillin-clavulanate (AUGMENTIN) 875-125 MG tablet Take 1 tablet by mouth every 12 (twelve) hours. If needed for sinusitis 01/03/20  Yes Panosh, Standley Brooking, MD  Ascorbic Acid (VITAMIN C) 500 MG CAPS Take 1 capsule by mouth daily.   Yes [provider]  Blood Glucose Monitoring Suppl (ACCU-CHEK GUIDE) w/Device KIT USE TO CHECK BLOOD SUGAR  TWICE DAILY 12/18/19  Yes Panosh, Standley Brooking, MD  Cholecalciferol 1000 UNITS capsule Take 1 capsule (1,000 Units total) by mouth daily. 10/14/11  Yes Panosh, Standley Brooking, MD  fluticasone (FLONASE) 50  MCG/ACT nasal spray Place 2 sprays into both nostrils daily. 09/13/17  Yes Martinique, Betty G, MD  glucose blood (ACCU-CHEK AVIVA PLUS) test strip Test twice daily. 04/04/19  Yes Panosh, Standley Brooking, MD  Lancet Devices [facility]) lancets Test twice daily. 06/12/13  Yes Panosh, Standley Brooking, MD  Lancets (ACCU-CHEK SOFT TOUCH) lancets Use as instructed 04/04/19  Yes Panosh, Standley Brooking, MD  levothyroxine (SYNTHROID) 75 MCG tablet NEW INSTRUCTIONS--Take 1 tablet only 6 days a week. Monday through Saturday. Do not take on Sunday. 09/10/19  Yes Panosh, Standley Brooking, MD  metFORMIN (GLUCOPHAGE-XR) 500 MG 24 hr tablet TAKE 2 TABLETS BY MOUTH  DAILY 12/18/19  Yes Panosh, Standley Brooking, MD  MULTIPLE VITAMIN PO Take 1 capsule by mouth daily.   Yes [provider]  Omega-3 Fatty Acids (FISH OIL) 435 MG CAPS Take 1 capsule by mouth in the morning and at bedtime.    Yes [provider]  predniSONE (DELTASONE) 20 MG tablet Take 1 tablet (20 mg total) by mouth 2 (two) times daily. 01/03/20  Yes Panosh, Standley Brooking, MD  simvastatin (ZOCOR) 40 MG tablet TAKE 1 TABLET BY MOUTH AT  BEDTIME 07/11/19  Yes Burchette, Alinda Sierras, MD     Positive ROS: Otherwise negative  All other systems have been reviewed and were otherwise negative with the exception of those mentioned in the HPI and as above.  Physical Exam: Constitutional: Alert, well-appearing, no acute distress Ears: External ears without lesions or tenderness. Ear canals are clear bilaterally with intact, clear TMs.  Nasal: External nose without lesions. Septum with minimal deformity.  She has polyps on the right side with minimal polyps on the left side.. Clear nasal passages  with no signs of infection  Oral: Lips and gums without lesions. Tongue and palate mucosa without lesions. Posterior oropharynx clear. Neck: No palpable adenopathy or masses Respiratory: Breathing comfortably  Skin: No facial/neck lesions or rash noted.  Procedures  Assessment: Recurrent sinonasal  polyps  Plan: Briefly reviewed surgery with the patient today in the office which would include FESS and turbinate reductions. However her symptoms are not too bad right now and would wait until symptoms are worse.  Reviewed with her that surgery would help improve her breathing but not necessarily improve her smelling.  Discussed with her concerning regular use of the nasal steroid spray and try to get it to the roof of the nose as this will be more beneficial concerning her sense of smell. She will contact us when symptoms are worse concerning possible surgical intervention.   Radene Journey, MD

## 2020-02-18 ENCOUNTER — Other Ambulatory Visit: Payer: Self-pay | Admitting: Internal Medicine

## 2020-02-25 NOTE — Progress Notes (Signed)
Chief Complaint  Patient presents with  . Annual Exam    having right hip pain,  makes right foot numb.  begin in 2021    HPI: Debbie Marshall 74 y.o. comes in today for Preventive Medicare exam/ wellness visit  And Chronic disease management .  DM  98   110 some readings were up recently could be related to prednisone.  Steroid care. Sinusitis   Polyps  pred  One more time to see   One month of  Nose   spray     2 weeks  One side better    Right    Thyroid : Medication no change. Right back radiculopathy: Did see specialist symptoms are better but she has persistent numbness in the right lateral foot RI had shown ruptured nucleus pulposus consistent with those symptoms.  She has no weakness at this time. Care takes for older father in his 57s generally well. Gets tested frequently for Covid because of church community. HLD taking medication no change   Health Maintenance  Topic Date Due  . OPHTHALMOLOGY EXAM  11/19/2015  . COLONOSCOPY  01/12/2018  . HEMOGLOBIN A1C  08/25/2020  . URINE MICROALBUMIN  09/05/2020  . MAMMOGRAM  01/22/2021  . FOOT EXAM  02/25/2021  . TETANUS/TDAP  06/13/2023  . INFLUENZA VACCINE  Completed  . DEXA SCAN  Completed  . COVID-19 Vaccine  Completed  . Hepatitis C Screening  Completed  . PNA vac Low Risk Adult  Completed   Health Maintenance Review LIFESTYLE:  Exercise:   recently not normal  Trying to walk for now   30  Minutes    Tobacco/ETS: no Alcohol:   ocass  Sugar beverages:  No rare  Sleep:  Problem some   7-8  Hours   Drug use: no HH:2     One cat       Hearing: ok  Vision:  No limitations at present . Last eye check UTD  Safety:  Has smoke detector and wears seat belts.   No excess sun exposure. Sees dentist regularly.  Falls: n  Memory: Felt to be good  , no concern from her or her family.  Depression: No anhedonia unusual crying or depressive symptoms  Nutrition: Eats well balanced diet; adequate calcium and vitamin D. No  swallowing chewing problems.  Injury: no major injuries in the last six months.  Other healthcare providers:  Reviewed today .  Preventive parameters: up-to-date  Reviewed   ADLS:   There are no problems or need for assistance  driving, feeding, obtaining food, dressing, toileting and bathing, managing money using phone. She is independent.   ROS:  See hpi GEN/ HEENT: No fever, significant weight changes sweats headaches vision problems hearing changes, CV/ PULM; No chest pain shortness of breath cough, syncope,edema  change in exercise tolerance. GI /GU: No adominal pain, vomiting, change in bowel habits. No blood in the stool. No significant GU symptoms. SKIN/HEME: ,no acute skin rashes suspicious lesions or bleeding. No lymphadenopathy, nodules, masses.  NEURO/ PSYCH:  Nosig . No depression anxiety. IMM/ Allergy: No unusual infections.  Allergy .   REST of 12 system review negative except as per HPI   Past Medical History:  Diagnosis Date  . ABNORMAL EXAM-BILIARY TRACT 11/14/2008  . ABNORMAL TRANSAMINASE, (LFT'S) 11/14/2008    2005 US shows fatty liver  . Cancer ([place]) 1996   tongue cancer  . Diabetes mellitus without complication ([place])   . FASTING HYPERGLYCEMIA 09/07/2006  . GERD  09/07/2006  . HYPERLIPIDEMIA 09/07/2006  . HYPOTHYROIDISM 10/30/2006  . Nonspecific abnormal results of liver function study 09/07/2006  . Normal nuclear stress test 2005   stress cardiolite   . OSTEOARTHRITIS 09/07/2006  . Positive PPD    received vaccine [place]- shows positive after receiving  . 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 VITAMIN D DEFICIENCY 08/14/2009    Family History  Problem Relation Age of Onset  . Stroke Mother 58  . Arthritis Mother   . Rheum arthritis Brother   . Arthritis Brother   . Gout Brother   . Colon cancer Other   . Esophageal cancer Neg Hx   . Stomach cancer Neg Hx   . Rectal cancer Neg Hx     Social History    Socioeconomic History  . Marital status: Single    Spouse name: Not on file  . Number of children: Not on file  . Years of education: Not on file  . Highest education level: Not on file  Occupational History  . Occupation: PhD Professor  Tobacco Use  . Smoking status: Never Smoker  . Smokeless tobacco: Never Used  Vaping Use  . Vaping Use: Never used  Substance and Sexual Activity  . Alcohol use: Yes    Alcohol/week: 1.0 standard drink    Types: 1 Glasses of wine per week    Comment: rarely  . Drug use: No  . Sexual activity: Not on file  Other Topics Concern  . Not on file  Social History Narrative   HH of 2 Father and her    cat     PhD professor in information systems    No tobacco or alcohol exercising regularly currently   Social Determinants of Health   Financial Resource Strain: Low Risk   . Difficulty of Paying Living Expenses: Not hard at all  Food Insecurity:   . Worried About Charity fundraiser in the Last Year: Not on file  . Ran Out of Food in the Last Year: Not on file  Transportation Needs: No Transportation Needs  . Lack of Transportation (Medical): No  . Lack of Transportation (Non-Medical): No  Physical Activity:   . Days of Exercise per Week: Not on file  . Minutes of Exercise per Session: Not on file  Stress:   . Feeling of Stress : Not on file  Social Connections:   . Frequency of Communication with Friends and Family: Not on file  . Frequency of Social Gatherings with Friends and Family: Not on file  . Attends Religious Services: Not on file  . Active Member of Clubs or Organizations: Not on file  . Attends Archivist Meetings: Not on file  . Marital Status: Not on file    Outpatient Encounter Medications as of 02/26/2020  Medication Sig  . Ascorbic Acid (VITAMIN C) 500 MG CAPS Take 1 capsule by mouth daily.  . Blood Glucose Monitoring Suppl (ACCU-CHEK GUIDE) w/Device KIT USE TO CHECK BLOOD SUGAR  TWICE DAILY  .  Cholecalciferol 1000 UNITS capsule Take 1 capsule (1,000 Units total) by mouth daily.  . fluticasone (FLONASE) 50 MCG/ACT nasal spray Place 2 sprays into both nostrils daily.  Marland Kitchen glucose blood (ACCU-CHEK AVIVA PLUS) test strip Test twice daily.  El[employer] Devices (ACCU-CHEK SOFTCLIX) lancets Test twice daily.  . Lancets (ACCU-CHEK SOFT TOUCH) lancets Use as instructed  . levothyroxine (SYNTHROID) 75 MCG tablet TAKE 1  TABLET BY MOUTH 6  DAYS A WEEK MONDAY THROUGH  SATURDAY, DO NOT TAKE ON  SUNDAY  . metFORMIN (GLUCOPHAGE-XR) 500 MG 24 hr tablet TAKE 2 TABLETS BY MOUTH  DAILY  . MULTIPLE VITAMIN PO Take 1 capsule by mouth daily.  . Omega-3 Fatty Acids (FISH OIL) 435 MG CAPS Take 1 capsule by mouth in the morning and at bedtime.   . simvastatin (ZOCOR) 40 MG tablet TAKE 1 TABLET BY MOUTH AT  BEDTIME  . [DISCONTINUED] predniSONE (DELTASONE) 20 MG tablet Take 1 tablet (20 mg total) by mouth 2 (two) times daily.  . [DISCONTINUED] amoxicillin-clavulanate (AUGMENTIN) 875-125 MG tablet Take 1 tablet by mouth every 12 (twelve) hours. If needed for sinusitis   No facility-administered encounter medications on file as of 02/26/2020.    EXAM:  There were no vitals taken for this visit.  There is no height or weight on file to calculate BMI.  Physical Exam: Vital signs reviewed [id number] is a well-developed well-nourished alert cooperative   who appears stated age in no acute distress.  HEENT: normocephalic atraumatic , Eyes: PERRL EOM's full, conjunctiva clear, Nares:congested   Ears: no deformity EAC's clear TMs with normal landmarks. Mouth: masked NECK: supple without masses, thyromegaly or bruits. CHEST/PULM:  Clear to auscultation and percussion breath sounds equal no wheeze , rales or rhonchi. No chest wall deformities or tenderness. Breast: normal by inspection . No dimpling, discharge, masses, tenderness or discharge . CV: PMI is nondisplaced, S1 S2 no gallops, murmurs, rubs. Peripheral pulses are  full without delay.No JVD .  ABDOMEN: Bowel sounds normal nontender  No guard or rebound, no hepato splenomegal no CVA tenderness.   Extremtities:  No clubbing cyanosis or edema, no acute joint swelling or redness no focal atrophy NEURO:  Oriented x3, cranial nerves 3-12 appear to be intact, no obvious focal weakness,gait within normal limits no abnormal reflexes dec sens grossly  righ lateral foot  SKIN: No acute rashes normal turgor, color, no bruising or petechiae. PSYCH: Oriented, good eye contact, no obvious depression anxiety, cognition and judgment appear normal. LN: no cervical axillary inguinal adenopathy No noted deficits in memory, attention, and speech. Diabetic Foot Exam - Simple   Simple Foot Form Diabetic Foot exam was performed with the following findings: Yes 02/26/2020 11:01 AM  Visual Inspection No deformities, no ulcerations, no other skin breakdown bilaterally: Yes Sensation Testing See comments: Yes Pulse Check Posterior Tibialis and Dorsalis pulse intact bilaterally: Yes Comments Dec sense  s1 distribution otherwise nl      Lab Results  Component Value Date   WBC 6.9 09/06/2019   HGB 14.5 09/06/2019   HCT 43.4 09/06/2019   PLT 221.0 09/06/2019   GLUCOSE 92 02/26/2020   CHOL 126 02/26/2020   TRIG 113 02/26/2020   HDL 47 (L) 02/26/2020   LDLDIRECT 163.6 10/23/2006   LDLCALC 59 02/26/2020   ALT 15 09/06/2019   AST 20 09/06/2019   NA 143 02/26/2020   K 5.2 02/26/2020   CL 104 02/26/2020   CREATININE 0.70 02/26/2020   BUN 19 02/26/2020   CO2 29 02/26/2020   TSH 0.81 02/26/2020   INR 1.0 ratio 11/14/2008   HGBA1C 6.7 (H) 02/26/2020   MICROALBUR 1.2 09/06/2019    ASSESSMENT AND PLAN:  Discussed the following assessment and plan:  Encounter for preventative adult health care exam with abnormal findings  Hypothyroidism, unspecified type - Plan: TSH, Lipid panel, BASIC METABOLIC PANEL WITH GFR, Hemoglobin A1c, TSH, Lipid panel, BASIC METABOLIC PANEL  WITH GFR, Hemoglobin A1c  Hyperlipidemia, unspecified hyperlipidemia type - Plan: TSH, Lipid panel, BASIC METABOLIC PANEL WITH GFR, Hemoglobin A1c, TSH, Lipid panel, BASIC METABOLIC PANEL WITH GFR, Hemoglobin A1c  Medication management - Plan: TSH, Lipid panel, BASIC METABOLIC PANEL WITH GFR, Hemoglobin A1c, TSH, Lipid panel, BASIC METABOLIC PANEL WITH GFR, Hemoglobin A1c  Controlled type 2 diabetes mellitus with complication, without long-term current use of insulin (HCC)  Fasting hyperglycemia - Plan: TSH, Lipid panel, BASIC METABOLIC PANEL WITH GFR, Hemoglobin A1c, TSH, Lipid panel, BASIC METABOLIC PANEL WITH GFR, Hemoglobin A1c  Lumbar radiculopathy, right - Plan: TSH, Lipid panel, BASIC METABOLIC PANEL WITH GFR, Hemoglobin A1c, TSH, Lipid panel, BASIC METABOLIC PANEL WITH GFR, Hemoglobin A1c  Need for influenza vaccination - Plan: Flu Vaccine QUAD High Dose(Fluad) Updated labs today She will continue steroid topical trial for her nasal polyp surgery may be in the future. Reviewed data on her right lumbar radiculopathy expectant management if progressing or weakness we can get a neurosurgery consult second opinion. Continue lifestyle intervention medications blood sugar control off steroids should be easier. Reviewed goals. Influenza shot today can get Covid booster later.  Patient Care Team: Karolynn Infantino, Standley Brooking, MD as PCP - General Earnie Larsson, [facility] as Pharmacist (Pharmacist)  Patient Instructions  Fasting lab today   Let us know if you want [place] to get second opinion  Neurosurgery appt for the back disc and foot numbness.   Continue lifestyle intervention healthy eating and exercise . prednione  May have mad your sugar levels risk temporarily.   Ok to get the covid booster in a week or so .   FU depending on labs    Health Maintenance, Female Adopting a healthy lifestyle and getting preventive care are important in promoting health and wellness. Ask your health care  provider about:  The right schedule for you to have regular tests and exams.  Things you can do on your own to prevent diseases and keep yourself healthy. What should I know about diet, weight, and exercise? Eat a healthy diet   Eat a diet that includes plenty of vegetables, fruits, low-fat dairy products, and lean protein.  Do not eat a lot of foods that are high in solid fats, added sugars, or sodium. Maintain a healthy weight Body mass index (BMI) is used to identify weight problems. It estimates body fat based on height and weight. Your health care provider can help determine your BMI and help you achieve or maintain a healthy weight. Get regular exercise Get regular exercise. This is one of the most important things you can do for your health. Most adults should:  Exercise for at least 150 minutes each week. The exercise should increase your heart rate and make you sweat (moderate-intensity exercise).  Do strengthening exercises at least twice a week. This is in addition to the moderate-intensity exercise.  Spend less time sitting. Even light physical activity can be beneficial. Watch cholesterol and blood lipids Have your blood tested for lipids and cholesterol at 72 years of age, then have this test every 5 years. Have your cholesterol levels checked more often if:  Your lipid or cholesterol levels are high.  You are older than 72 years of age.  You are at high risk for heart disease. What should I know about cancer screening? Depending on your health history and family history, you may need to have cancer screening at various ages. This may include screening for:  Breast cancer.  Cervical cancer.  Colorectal cancer.  Skin cancer.  Lung cancer. What should I know about heart disease, diabetes, and high blood pressure? Blood pressure and heart disease  High blood pressure causes heart disease and increases the risk of stroke. This is more likely to develop in  people who have high blood pressure readings, are of African descent, or are overweight.  Have your blood pressure checked: ? Every 3-5 years if you are 69-33 years of age. ? Every year if you are 26 years old or older. Diabetes Have regular diabetes screenings. This checks your fasting blood sugar level. Have the screening done:  Once every three years after age 63 if you are at a normal weight and have a low risk for diabetes.  More often and at a younger age if you are overweight or have a high risk for diabetes. What should I know about preventing infection? Hepatitis B If you have a higher risk for hepatitis B, you should be screened for this virus. Talk with your health care provider to find out if you are at risk for hepatitis B infection. Hepatitis C Testing is recommended for:  Everyone born from [birth year] through 1963.  Anyone with known risk factors for hepatitis C. Sexually transmitted infections (STIs)  Get screened for STIs, including gonorrhea and chlamydia, if: ? You are sexually active and are younger than 72 years of age. ? You are older than 72 years of age and your health care provider tells you that you are at risk for this type of infection. ? Your sexual activity has changed since you were last screened, and you are at increased risk for chlamydia or gonorrhea. Ask your health care provider if you are at risk.  Ask your health care provider about whether you are at high risk for HIV. Your health care provider may recommend a prescription medicine to help prevent HIV infection. If you choose to take medicine to prevent HIV, you should first get tested for HIV. You should then be tested every 3 months for as long as you are taking the medicine. Pregnancy  If you are about to stop having your period (premenopausal) and you may become pregnant, seek counseling before you get pregnant.  Take 400 to 800 micrograms (mcg) of folic acid every day if you become  pregnant.  Ask for birth control (contraception) if you want to prevent pregnancy. Osteoporosis and menopause Osteoporosis is a disease in which the bones lose minerals and strength with aging. This can result in bone fractures. If you are 28 years old or older, or if you are at risk for osteoporosis and fractures, ask your health care provider if you should:  Be screened for bone loss.  Take a calcium or vitamin D supplement to lower your risk of fractures.  Be given hormone replacement therapy (HRT) to treat symptoms of menopause. Follow these instructions at home: Lifestyle  Do not use any products that contain nicotine or tobacco, such as cigarettes, e-cigarettes, and chewing tobacco. If you need help quitting, ask your health care provider.  Do not use street drugs.  Do not share needles.  Ask your health care provider for help if you need support or information about quitting drugs. Alcohol use  Do not drink alcohol if: ? Your health care provider tells you not to drink. ? You are pregnant, may be pregnant, or are planning to become pregnant.  If you drink alcohol: ? Limit how much you use to 0-1 drink a day. ? Limit intake if you  are breastfeeding.  Be aware of how much alcohol is in your drink. In the [place] one drink equals one 12 oz bottle of beer (355 mL), one 5 oz glass of wine (148 mL), or one 1 oz glass of hard liquor (44 mL). General instructions  Schedule regular health, dental, and eye exams.  Stay current with your vaccines.  Tell your health care provider if: ? You often feel depressed. ? You have ever been abused or do not feel safe at home. Summary  Adopting a healthy lifestyle and getting preventive care are important in promoting health and wellness.  Follow your health care provider's instructions about healthy diet, exercising, and getting tested or screened for diseases.  Follow your health care provider's instructions on monitoring your  cholesterol and blood pressure. This information is not intended to replace advice given to you by your health care provider. Make sure you discuss any questions you have with your health care provider. Document Revised: 03/21/2018 Document Reviewed: 03/21/2018 Elsevier Patient Education  2020 [place] Ople Girgis M.D.

## 2020-02-26 ENCOUNTER — Other Ambulatory Visit: Payer: Self-pay

## 2020-02-26 ENCOUNTER — Ambulatory Visit (INDEPENDENT_AMBULATORY_CARE_PROVIDER_SITE_OTHER): Payer: Medicare Other | Admitting: Internal Medicine

## 2020-02-26 ENCOUNTER — Encounter: Payer: Self-pay | Admitting: Internal Medicine

## 2020-02-26 VITALS — BP 130/70 | HR 64 | Temp 98.6°F | Ht 59.5 in | Wt 119.0 lb

## 2020-02-26 DIAGNOSIS — Z0001 Encounter for general adult medical examination with abnormal findings: Secondary | ICD-10-CM

## 2020-02-26 DIAGNOSIS — Z79899 Other long term (current) drug therapy: Secondary | ICD-10-CM | POA: Diagnosis not present

## 2020-02-26 DIAGNOSIS — Z23 Encounter for immunization: Secondary | ICD-10-CM | POA: Diagnosis not present

## 2020-02-26 DIAGNOSIS — R7301 Impaired fasting glucose: Secondary | ICD-10-CM | POA: Diagnosis not present

## 2020-02-26 DIAGNOSIS — E785 Hyperlipidemia, unspecified: Secondary | ICD-10-CM

## 2020-02-26 DIAGNOSIS — E118 Type 2 diabetes mellitus with unspecified complications: Secondary | ICD-10-CM

## 2020-02-26 DIAGNOSIS — M5416 Radiculopathy, lumbar region: Secondary | ICD-10-CM

## 2020-02-26 DIAGNOSIS — E039 Hypothyroidism, unspecified: Secondary | ICD-10-CM

## 2020-02-26 NOTE — Patient Instructions (Signed)
Fasting lab today   Let us know if you want [place] to get second opinion  Neurosurgery appt for the back disc and foot numbness.   Continue lifestyle intervention healthy eating and exercise . prednione  May have mad your sugar levels risk temporarily.   Ok to get the covid booster in a week or so .   FU depending on labs    Health Maintenance, Female Adopting a healthy lifestyle and getting preventive care are important in promoting health and wellness. Ask your health care provider about:  The right schedule for you to have regular tests and exams.  Things you can do on your own to prevent diseases and keep yourself healthy. What should I know about diet, weight, and exercise? Eat a healthy diet   Eat a diet that includes plenty of vegetables, fruits, low-fat dairy products, and lean protein.  Do not eat a lot of foods that are high in solid fats, added sugars, or sodium. Maintain a healthy weight Body mass index (BMI) is used to identify weight problems. It estimates body fat based on height and weight. Your health care provider can help determine your BMI and help you achieve or maintain a healthy weight. Get regular exercise Get regular exercise. This is one of the most important things you can do for your health. Most adults should:  Exercise for at least 150 minutes each week. The exercise should increase your heart rate and make you sweat (moderate-intensity exercise).  Do strengthening exercises at least twice a week. This is in addition to the moderate-intensity exercise.  Spend less time sitting. Even light physical activity can be beneficial. Watch cholesterol and blood lipids Have your blood tested for lipids and cholesterol at 72 years of age, then have this test every 5 years. Have your cholesterol levels checked more often if:  Your lipid or cholesterol levels are high.  You are older than 72 years of age.  You are at high risk for heart disease. What should I  know about cancer screening? Depending on your health history and family history, you may need to have cancer screening at various ages. This may include screening for:  Breast cancer.  Cervical cancer.  Colorectal cancer.  Skin cancer.  Lung cancer. What should I know about heart disease, diabetes, and high blood pressure? Blood pressure and heart disease  High blood pressure causes heart disease and increases the risk of stroke. This is more likely to develop in people who have high blood pressure readings, are of African descent, or are overweight.  Have your blood pressure checked: ? Every 3-5 years if you are 43-57 years of age. ? Every year if you are 35 years old or older. Diabetes Have regular diabetes screenings. This checks your fasting blood sugar level. Have the screening done:  Once every three years after age 39 if you are at a normal weight and have a low risk for diabetes.  More often and at a younger age if you are overweight or have a high risk for diabetes. What should I know about preventing infection? Hepatitis B If you have a higher risk for hepatitis B, you should be screened for this virus. Talk with your health care provider to find out if you are at risk for hepatitis B infection. Hepatitis C Testing is recommended for:  Everyone born from [birth year] through 1963.  Anyone with known risk factors for hepatitis C. Sexually transmitted infections (STIs)  Get screened for STIs, including gonorrhea  and chlamydia, if: ? You are sexually active and are younger than 72 years of age. ? You are older than 72 years of age and your health care provider tells you that you are at risk for this type of infection. ? Your sexual activity has changed since you were last screened, and you are at increased risk for chlamydia or gonorrhea. Ask your health care provider if you are at risk.  Ask your health care provider about whether you are at high risk for HIV. Your health  care provider may recommend a prescription medicine to help prevent HIV infection. If you choose to take medicine to prevent HIV, you should first get tested for HIV. You should then be tested every 3 months for as long as you are taking the medicine. Pregnancy  If you are about to stop having your period (premenopausal) and you may become pregnant, seek counseling before you get pregnant.  Take 400 to 800 micrograms (mcg) of folic acid every day if you become pregnant.  Ask for birth control (contraception) if you want to prevent pregnancy. Osteoporosis and menopause Osteoporosis is a disease in which the bones lose minerals and strength with aging. This can result in bone fractures. If you are 49 years old or older, or if you are at risk for osteoporosis and fractures, ask your health care provider if you should:  Be screened for bone loss.  Take a calcium or vitamin D supplement to lower your risk of fractures.  Be given hormone replacement therapy (HRT) to treat symptoms of menopause. Follow these instructions at home: Lifestyle  Do not use any products that contain nicotine or tobacco, such as cigarettes, e-cigarettes, and chewing tobacco. If you need help quitting, ask your health care provider.  Do not use street drugs.  Do not share needles.  Ask your health care provider for help if you need support or information about quitting drugs. Alcohol use  Do not drink alcohol if: ? Your health care provider tells you not to drink. ? You are pregnant, may be pregnant, or are planning to become pregnant.  If you drink alcohol: ? Limit how much you use to 0-1 drink a day. ? Limit intake if you are breastfeeding.  Be aware of how much alcohol is in your drink. In the [place] one drink equals one 12 oz bottle of beer (355 mL), one 5 oz glass of wine (148 mL), or one 1 oz glass of hard liquor (44 mL). General instructions  Schedule regular health, dental, and eye exams.  Stay  current with your vaccines.  Tell your health care provider if: ? You often feel depressed. ? You have ever been abused or do not feel safe at home. Summary  Adopting a healthy lifestyle and getting preventive care are important in promoting health and wellness.  Follow your health care provider's instructions about healthy diet, exercising, and getting tested or screened for diseases.  Follow your health care provider's instructions on monitoring your cholesterol and blood pressure. This information is not intended to replace advice given to you by your health care provider. Make sure you discuss any questions you have with your health care provider. Document Revised: 03/21/2018 Document Reviewed: 03/21/2018 Elsevier Patient Education  2020 [employer].

## 2020-02-27 ENCOUNTER — Encounter: Payer: Self-pay | Admitting: Internal Medicine

## 2020-02-27 LAB — BASIC METABOLIC PANEL WITH GFR
BUN: 19 mg/dL (ref 7–25)
CO2: 29 mmol/L (ref 20–32)
Calcium: 10 mg/dL (ref 8.6–10.4)
Chloride: 104 mmol/L (ref 98–110)
Creat: 0.7 mg/dL (ref 0.60–0.93)
GFR, Est African American: 100 mL/min/{1.73_m2} (ref >=60)
GFR, Est Non African American: 87 mL/min/{1.73_m2} (ref >=60)
Glucose, Bld: 92 mg/dL (ref 65–99)
Potassium: 5.2 mmol/L (ref 3.5–5.3)
Sodium: 143 mmol/L (ref 135–146)

## 2020-02-27 LAB — LIPID PANEL
Cholesterol: 126 mg/dL (ref <200)
HDL: 47 mg/dL — ABNORMAL LOW (ref >=50)
LDL Cholesterol (Calc): 59 mg/dL (calc)
Non-HDL Cholesterol (Calc): 79 mg/dL (calc) (ref <130)
Total CHOL/HDL Ratio: 2.7 (calc) (ref <5.0)
Triglycerides: 113 mg/dL (ref <150)

## 2020-02-27 LAB — HEMOGLOBIN A1C
Hgb A1c MFr Bld: 6.7 % of total Hgb — ABNORMAL HIGH (ref <5.7)
Mean Plasma Glucose: 146 (calc)
eAG (mmol/L): 8.1 (calc)

## 2020-02-27 LAB — TSH: TSH: 0.81 mIU/L (ref 0.40–4.50)

## 2020-02-29 DIAGNOSIS — Z20822 Contact with and (suspected) exposure to covid-19: Secondary | ICD-10-CM | POA: Diagnosis not present

## 2020-03-09 ENCOUNTER — Ambulatory Visit: Payer: Medicare Other | Admitting: Internal Medicine

## 2020-03-10 NOTE — Progress Notes (Signed)
Thyroid now in normal range  A1c up slightly to 6.7  not sure how much the  prednisone effected this . You can try to increase  metformin  500 to 3 per day  2 in am and 1 in pm   Or    plan fu in about 4 months and we can  redo hg a1c at that time  to decide on med  adjustment  Let us  know which way you want to go and plan fu in  4 month

## 2020-03-11 ENCOUNTER — Telehealth: Payer: Self-pay

## 2020-03-11 DIAGNOSIS — R200 Anesthesia of skin: Secondary | ICD-10-CM

## 2020-03-11 DIAGNOSIS — M5410 Radiculopathy, site unspecified: Secondary | ICD-10-CM

## 2020-03-11 NOTE — Telephone Encounter (Signed)
Spoke with patient she is requesting a referral for a specialist for the numbness in her feet, she stated that she discussed this at her last office visit

## 2020-03-18 ENCOUNTER — Ambulatory Visit (INDEPENDENT_AMBULATORY_CARE_PROVIDER_SITE_OTHER): Payer: Medicare Other | Admitting: Otolaryngology

## 2020-03-19 ENCOUNTER — Other Ambulatory Visit (INDEPENDENT_AMBULATORY_CARE_PROVIDER_SITE_OTHER): Payer: Self-pay

## 2020-03-19 ENCOUNTER — Telehealth (INDEPENDENT_AMBULATORY_CARE_PROVIDER_SITE_OTHER): Payer: Self-pay

## 2020-03-19 MED ORDER — AMOXICILLIN-POT CLAVULANATE 875-125 MG PO TABS: 1.0000 | ORAL_TABLET | Freq: Two times a day (BID) | ORAL | 0 refills | Status: DC

## 2020-03-25 ENCOUNTER — Other Ambulatory Visit (INDEPENDENT_AMBULATORY_CARE_PROVIDER_SITE_OTHER): Payer: Self-pay

## 2020-03-25 ENCOUNTER — Other Ambulatory Visit: Payer: Self-pay

## 2020-03-25 ENCOUNTER — Telehealth: Payer: Medicare Other

## 2020-03-25 ENCOUNTER — Ambulatory Visit (INDEPENDENT_AMBULATORY_CARE_PROVIDER_SITE_OTHER): Payer: Medicare Other | Admitting: Otolaryngology

## 2020-03-25 ENCOUNTER — Encounter (INDEPENDENT_AMBULATORY_CARE_PROVIDER_SITE_OTHER): Payer: Self-pay | Admitting: Otolaryngology

## 2020-03-25 VITALS — Temp 97.2°F

## 2020-03-25 DIAGNOSIS — J320 Chronic maxillary sinusitis: Secondary | ICD-10-CM

## 2020-03-25 DIAGNOSIS — J339 Nasal polyp, unspecified: Secondary | ICD-10-CM | POA: Diagnosis not present

## 2020-03-25 DIAGNOSIS — J322 Chronic ethmoidal sinusitis: Secondary | ICD-10-CM

## 2020-03-25 MED ORDER — PREDNISONE 10 MG (21) PO TBPK: ORAL_TABLET | ORAL | 0 refills | Status: DC

## 2020-03-25 NOTE — Chronic Care Management (AMB) (Deleted)
Chronic Care Management Pharmacy  Name: Debbie Marshall  MRN: [id number] DOB: [birth year]  Initial Questions: 1. Have you seen any other providers since your last visit? NA 2. Any changes in your medicines or health? No   Chief Complaint/ HPI   Debbie Marshall,  74 y.o. , female presents for their Initial CCM visit with the clinical pharmacist In office.  PCP : Burnis Medin, MD  Their chronic conditions include: Hypothyroidism, DM,, osteopenia, vitamin D deficiency, HLD  Office Visits: 02/26/20 Shanon Ace, MD: Patient presented for annual exam. A1c increased to 6.7%. Patient opted to not increase metformin and follow up in 4 months. Other labs were WNL.  01/06/20 Carolann Littler, MD: Patient presented with cough. Recent sinusitis symptoms improved currently on Augmentin and prednisone.  01/03/20 Shanon Ace, MD: Patient presented for nasal congestion. Prescribed Augmentin.  12/03/19 Shanon Ace, MD: Patient presented for nasal congestion. Prescribed prednisone.  09/06/2019- Shanon Ace, MD- Patient presented for office visit for follow up.  Patient to obtain following labs for further assessment: TSH, T4 free, BMP, A1c, lipid panel, microalbumin/ creatinine urine ratio, hepatic function panel, CBC.   04/16/2019- Carolann Littler, MD- Patient presented for office visit for low back pain with right radiculitis symptoms. Patient to obtain MRI for further assesessment.   Consult Visit: 03/25/20 Melony Overly, MD (otolaryngology): Patent presented for evaluation of nasal polyps follow up.   02/06/20 Melony Overly, MD (otolaryngology): Patent presented for evaluation of nasal polyps. Patient has improved with use of steroids.   05/06/2019- Orthopedic surgery- [facility]- Patient presented for office visit for low back pain. Unable to access notes.   Medications: Outpatient Encounter Medications as of 03/25/2020  Medication Sig  . amoxicillin-clavulanate (AUGMENTIN) 875-125 MG  tablet Take 1 tablet by mouth 2 (two) times daily.  . Ascorbic Acid (VITAMIN C) 500 MG CAPS Take 1 capsule by mouth daily.  . Blood Glucose Monitoring Suppl (ACCU-CHEK GUIDE) w/Device KIT USE TO CHECK BLOOD SUGAR  TWICE DAILY  . Cholecalciferol 1000 UNITS capsule Take 1 capsule (1,000 Units total) by mouth daily.  . fluticasone (FLONASE) 50 MCG/ACT nasal spray Place 2 sprays into both nostrils daily.  Marland Kitchen glucose blood (ACCU-CHEK AVIVA PLUS) test strip Test twice daily.  Elmore Guise Devices (ACCU-CHEK SOFTCLIX) lancets Test twice daily.  . Lancets (ACCU-CHEK SOFT TOUCH) lancets Use as instructed  . levothyroxine (SYNTHROID) 75 MCG tablet TAKE 1 TABLET BY MOUTH 6  DAYS A WEEK MONDAY THROUGH  SATURDAY, DO NOT TAKE ON  SUNDAY  . metFORMIN (GLUCOPHAGE-XR) 500 MG 24 hr tablet TAKE 2 TABLETS BY MOUTH  DAILY  . MULTIPLE VITAMIN PO Take 1 capsule by mouth daily.  . Omega-3 Fatty Acids (FISH OIL) 435 MG CAPS Take 1 capsule by mouth in the morning and at bedtime.   . predniSONE (STERAPRED UNI-PAK 21 TAB) 10 MG (21) TBPK tablet Pt on Day 1 take 6 tablets, Day 2 take 5, Day 3 take 4 tablets, day 4 take 3 tablets, day 5 take 2 tablets,day 6 take 1 tablet  . simvastatin (ZOCOR) 40 MG tablet TAKE 1 TABLET BY MOUTH AT  BEDTIME   No facility-administered encounter medications on file as of 03/25/2020.    Current Diagnosis/Assessment:  Goals Addressed   None       Diabetes   Patient denies increasing metformin dose to 2 tabs daily as previously instructed. She opted to improve diet and exercise to improve A1c.    Recent Relevant Labs: Lab Results  Component Value Date/Time  HGBA1C 6.7 (H) 02/26/2020 12:54 PM   HGBA1C 6.4 09/06/2019 10:16 AM   MICROALBUR 1.2 09/06/2019 10:16 AM   MICROALBUR <0.7 06/21/2018 10:26 AM    Checking BG: 2x per Day  Recent BG Readings:103-105  Patient has failed these meds in past: none   Patient is currently controlled on the following medications:   Metformin ER  583m, 1 tablet once daily   Last diabetic Eye exam:  Lab Results  Component Value Date/Time   HMDIABEYEEXA No Retinopathy 11/19/2014 12:00 AM    Last diabetic Foot exam: No results found for: HMDIABFOOTEX   We discussed: diet and exercise extensively  Plan Continue current medications  Requesting updated RX for metformin with once daily sig.   Hyperlipidemia   LDL goal < 100  Lipid Panel     Component Value Date/Time   CHOL 126 02/26/2020 1254   TRIG 113 02/26/2020 1254   HDL 47 (L) 02/26/2020 1254   LDLCALC 59 02/26/2020 1254   LDLDIRECT 163.6 10/23/2006 1002    Hepatic Function Latest Ref Rng & Units 09/06/2019 01/25/2019 12/22/2017  Total Protein 6.0 - 8.3 g/dL 7.1 6.6 6.4  Albumin 3.5 - 5.2 g/dL 4.6 4.5 4.2  AST 0 - 37 U/L 20 18 15   ALT 0 - 35 U/L 15 16 18   Alk Phosphatase 39 - 117 U/L 72 56 48  Total Bilirubin 0.2 - 1.2 mg/dL 0.6 0.8 0.6  Bilirubin, Direct 0.0 - 0.3 mg/dL 0.1 0.2 0.1     The ASCVD Risk score ([place]ats Bridge, et al., 2013) failed to calculate for the following reasons:   The valid total cholesterol range is 130 to 320 mg/dL   Patient has failed these meds in past: none  Patient is currently controlled on the following medications:  . Simvastatin 481m 1 tablet at bedtime  . Omega-3 fatty acids 100050m 1 capsule in the morning and at bedtime   We discussed:  diet and exercise extensively  . How to reduce cholesterol through diet/weight management and physical activity.    . We discussed how a diet high in plant sterols (fruits/vegetables/nuts/whole grains/legumes) may reduce your cholesterol.  Encouraged increasing fiber to a daily intake of 10-25g/day   Plan Continue current medications  Hypothyroidism  Patient reported taking levothyroxine about 15 minutes before breakfast and recently underwent change of Synthroid to generic levothyroxine (due to cost).    Lab Results  Component Value Date/Time   TSH 0.81 02/26/2020 12:54 PM   TSH 4.26  11/29/2019 08:13 AM   TSH 4.69 (H) 09/06/2019 10:16 AM    Patient has failed these meds in past: none   Patient is currently uncontrolled on the following medications:  . Levothyroxine 22m71m1 tablet six days a week (do not take on Sunday)   We discussed:  administration time of levothyroxine (at least 30 minutes before first meal and other medications).   Plan Recommended patient to schedule lab visit at end of August or September for repeat TSH (3 to 4 months after last TSH).  Continue current medications  Osteopenia/ vitamin D deficiency   Patient did not recall more recent  DEXA scan. She reports getting dairy in milk, cheese, yogurt. And is planning on including weight bearing exercises.   Last DEXA Scan: 11/01/2012   T-Score femoral neck: LFN and RFN: -1.7   VITD  Date Value Ref Range Status  05/06/2015 24.76 (L) 30.00 - 100.00 ng/mL Final    Patient is not a candidate for pharmacologic  treatment  Patient is currently controlled on the following medications:   Vitamin D3 (cholecalciferol 1000 units, 1 capsule once daily   We discussed:  Recommend 1200 mg of calcium daily from dietary and supplemental sources. Recommend weight-bearing and muscle strengthening exercises for building and maintaining bone density.  Plan Recommended DEXA scan every 3 to 5 years.  Recommend repeat vitamin D level  Continue current medications  Allergic rhinitis    Patient has failed these meds in past: none  Patient is currently controlled on the following medications:  . Fluticasone (Flonase) nasal spray, 2 sprays into both nostrils once daily (current not using)   Plan Continue current medications  OTC/ Supplements    Patient is currently on the following medications:   Vitamin C 530m, 1 capsule once daily  Multivitamin, 1 capsule once daily  Plan Continue current medications   Medication Management  Patient organizes medications: patient reports having own system. Keeps  medications in pill bottles and endorses missing doses at least 1-2x/ month.   Recommended use of pill box  Primary pharmacy: [facility] Adherence:  - metformin (no recent fill for year 2021)- patient decreased dose to once daily (needed updated prescription sent to [facility].     Follow up Follow up visit with PharmD in 6 months  AAnson Crofts PharmD Clinical Pharmacist [facility]([phone]

## 2020-03-25 NOTE — Progress Notes (Addendum)
HPI: Debbie Marshall is a 74 y.o. female who returns today for evaluation of chronic nasal obstruction and nasal polyps.  She also complains of chronic postnasal drainage.  She underwent a CT scan of her sinuses in 2018..  This showed bilateral sinonasal polyps worse on the right side with disease within the ethmoid area and maxillary sinuses.  Her nasal obstruction appears worse on the right side at that time.  She has been on steroids several times because of recurrent swelling of the polyps.  She is also on nasal steroid sprays. She is otherwise healthy.  No history of cardiac disease. She is on no blood thinners. 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  She has had no evidence of recurrence of this..  Past Medical History:  Diagnosis Date  . ABNORMAL EXAM-BILIARY TRACT 11/14/2008  . ABNORMAL TRANSAMINASE, (LFT'S) 11/14/2008    2005 US shows fatty liver  . Cancer ([place]) 1996   tongue cancer  . Diabetes mellitus without complication ([place])   . FASTING HYPERGLYCEMIA 09/07/2006  . GERD 09/07/2006  . HYPERLIPIDEMIA 09/07/2006  . HYPOTHYROIDISM 10/30/2006  . Nonspecific abnormal results of liver function study 09/07/2006  . Normal nuclear stress test 2005   stress cardiolite   . OSTEOARTHRITIS 09/07/2006  . Positive PPD    received vaccine in home country- shows positive after receiving  . 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 VITAMIN D DEFICIENCY 08/14/2009   Past Surgical History:  Procedure Laterality Date  . COLONOSCOPY    . [place]   Tumor removed on back of tongue and salivary gland   Social History   Socioeconomic History  . Marital status: Single    Spouse name: Not on file  . Number of children: Not on file  . Years of education: Not on file  . Highest education level: Not on file  Occupational History  . Occupation: PhD Professor  Tobacco Use  . Smoking status: Never Smoker  .  Smokeless tobacco: Never Used  Vaping Use  . Vaping Use: Never used  Substance and Sexual Activity  . Alcohol use: Yes    Alcohol/week: 1.0 standard drink    Types: 1 Glasses of wine per week    Comment: rarely  . Drug use: No  . Sexual activity: Not on file  Other Topics Concern  . Not on file  Social History Narrative   HH of 2 Father and her    cat     PhD professor in information systems    No tobacco or alcohol exercising regularly currently   Social Determinants of Health   Financial Resource Strain: Low Risk   . Difficulty of Paying Living Expenses: Not hard at all  Food Insecurity: Not on file  Transportation Needs: No Transportation Needs  . Lack of Transportation (Medical): No  . Lack of Transportation (Non-Medical): No  Physical Activity: Not on file  Stress: Not on file  Social Connections: Not on file   Family History  Problem Relation Age of Onset  . Stroke Mother 67  . Arthritis Mother   . Rheum arthritis Brother   . Arthritis Brother   . Gout Brother   . Colon cancer Other   . Esophageal cancer Neg Hx   . Stomach cancer Neg Hx   . Rectal cancer Neg Hx    No Known Allergies Prior to Admission medications  Medication Sig Start Date End Date Taking? Authorizing Provider  amoxicillin-clavulanate (AUGMENTIN) 875-125 MG tablet Take 1 tablet by mouth 2 (two) times daily. 03/19/20   Rozetta Nunnery, MD  Ascorbic Acid (VITAMIN C) 500 MG CAPS Take 1 capsule by mouth daily.    [provider]  Blood Glucose Monitoring Suppl (ACCU-CHEK GUIDE) w/Device KIT USE TO CHECK BLOOD SUGAR  TWICE DAILY 12/18/19   Panosh, Standley Brooking, MD  Cholecalciferol 1000 UNITS capsule Take 1 capsule (1,000 Units total) by mouth daily. 10/14/11   Panosh, Standley Brooking, MD  fluticasone (FLONASE) 50 MCG/ACT nasal spray Place 2 sprays into both nostrils daily. 09/13/17   Martinique, Betty G, MD  glucose blood (ACCU-CHEK AVIVA PLUS) test strip Test twice daily. 04/04/19   Panosh, Standley Brooking, MD   Lancet Devices [facility]) lancets Test twice daily. 06/12/13   Panosh, Standley Brooking, MD  Lancets (ACCU-CHEK SOFT TOUCH) lancets Use as instructed 04/04/19   Panosh, Standley Brooking, MD  levothyroxine (SYNTHROID) 75 MCG tablet TAKE 1 TABLET BY MOUTH 6  DAYS A WEEK MONDAY THROUGH  SATURDAY, DO NOT TAKE ON  SUNDAY 02/18/20   Panosh, Standley Brooking, MD  metFORMIN (GLUCOPHAGE-XR) 500 MG 24 hr tablet TAKE 2 TABLETS BY MOUTH  DAILY 12/18/19   Panosh, Standley Brooking, MD  MULTIPLE VITAMIN PO Take 1 capsule by mouth daily.    [provider]  Omega-3 Fatty Acids (FISH OIL) 435 MG CAPS Take 1 capsule by mouth in the morning and at bedtime.     [provider]  simvastatin (ZOCOR) 40 MG tablet TAKE 1 TABLET BY MOUTH AT  BEDTIME 07/11/19   Burchette, Alinda Sierras, MD     Positive ROS: Otherwise negative  All other systems have been reviewed and were otherwise negative with the exception of those mentioned in the HPI and as above.  Physical Exam: Constitutional: Alert, well-appearing, no acute distress Ears: External ears without lesions or tenderness. Ear canals are clear bilaterally with intact, clear TMs.  Nasal: External nose without lesions. Septum relatively midline with moderate rhinitis.  She has a large polyp in the right nasal cavity some smaller polyps on the left side.  Question whether the polyp on the right side could possibly be inverting papilloma.  But appears more like a typical polyp.. Oral: Lips and gums without lesions. Tongue and palate mucosa without lesions. Posterior oropharynx clear. Neck: No palpable adenopathy or masses Respiratory: Breathing comfortably  Skin: No facial/neck lesions or rash noted.  Procedures  Assessment: Sinonasal polyps worse on the right side with nasal obstruction. Chronic ethmoid and maxillary disease.  Plan: Discussed with patient today concerning endoscopic sinus surgery with removal of sinonasal polyps along with ethmoidectomy and maxillary ostia  enlargement.  We will also plan turbinate reductions.  She has minimal septal deformity. We will have to get a more recent CT scan and we will schedule fusion guided CT scan in the next couple weeks prior to surgical intervention. We will plan on scheduling this after the holidays.  At her request I placed her on a Sterapred 10 mg 6-day Dosepak as this has been beneficial in the past but she has already been on 2 rounds this year.   Radene Journey, MD

## 2020-04-02 NOTE — Telephone Encounter (Addendum)
Was out of office  . Please arrange  refer  To  Dr Ellene Route  Neurosurgery for opinion  For  Her  Right   Back radiculopathy  HNP and  Numbness of foot  I believe imaging done at [facility]( see my last  Ov pv note)

## 2020-04-06 NOTE — Addendum Note (Signed)
Addended by: Rodrigo Ran on: 04/06/2020 09:10 AM   Modules accepted: Orders

## 2020-04-06 NOTE — Telephone Encounter (Signed)
Referral has been entered.

## 2020-04-10 DIAGNOSIS — Z01818 Encounter for other preprocedural examination: Secondary | ICD-10-CM | POA: Diagnosis not present

## 2020-04-14 ENCOUNTER — Ambulatory Visit
Admission: RE | Admit: 2020-04-14 | Discharge: 2020-04-14 | Disposition: A | Discharge status: Home / Self Care | Payer: Medicare Other | Source: Ambulatory Visit | Attending: Otolaryngology | Admitting: Otolaryngology

## 2020-04-14 ENCOUNTER — Other Ambulatory Visit: Payer: Self-pay

## 2020-04-14 DIAGNOSIS — J320 Chronic maxillary sinusitis: Secondary | ICD-10-CM | POA: Diagnosis not present

## 2020-04-14 DIAGNOSIS — R0981 Nasal congestion: Secondary | ICD-10-CM | POA: Diagnosis not present

## 2020-04-14 DIAGNOSIS — J3489 Other specified disorders of nose and nasal sinuses: Secondary | ICD-10-CM | POA: Diagnosis not present

## 2020-04-14 DIAGNOSIS — J0100 Acute maxillary sinusitis, unspecified: Secondary | ICD-10-CM | POA: Diagnosis not present

## 2020-04-16 ENCOUNTER — Other Ambulatory Visit (INDEPENDENT_AMBULATORY_CARE_PROVIDER_SITE_OTHER): Payer: Self-pay | Admitting: Otolaryngology

## 2020-04-16 DIAGNOSIS — J322 Chronic ethmoidal sinusitis: Secondary | ICD-10-CM | POA: Diagnosis not present

## 2020-04-16 DIAGNOSIS — J339 Nasal polyp, unspecified: Secondary | ICD-10-CM | POA: Diagnosis not present

## 2020-04-16 DIAGNOSIS — J338 Other polyp of sinus: Secondary | ICD-10-CM | POA: Diagnosis not present

## 2020-04-16 DIAGNOSIS — J343 Hypertrophy of nasal turbinates: Secondary | ICD-10-CM | POA: Diagnosis not present

## 2020-04-16 DIAGNOSIS — J320 Chronic maxillary sinusitis: Secondary | ICD-10-CM | POA: Diagnosis not present

## 2020-04-16 HISTORY — PX: NASAL POLYP SURGERY: SHX186

## 2020-04-20 ENCOUNTER — Ambulatory Visit (INDEPENDENT_AMBULATORY_CARE_PROVIDER_SITE_OTHER): Payer: Medicare Other | Admitting: Otolaryngology

## 2020-04-20 ENCOUNTER — Other Ambulatory Visit: Payer: Self-pay

## 2020-04-20 VITALS — Temp 97.2°F

## 2020-04-20 DIAGNOSIS — Z4889 Encounter for other specified surgical aftercare: Secondary | ICD-10-CM

## 2020-04-20 NOTE — Progress Notes (Signed)
HPI: Debbie Marshall is a 75 y.o. female who presents 4 days s/p FESS and turbinate reductions for sinonasal polyps and nasal obstruction.  She is doing well with mild nasal obstruction more on the left side..   Past Medical History:  Diagnosis Date  . ABNORMAL EXAM-BILIARY TRACT 11/14/2008  . ABNORMAL TRANSAMINASE, (LFT'S) 11/14/2008    2005 US shows fatty liver  . Cancer ([place]) 1996   tongue cancer  . Diabetes mellitus without complication ([place])   . FASTING HYPERGLYCEMIA 09/07/2006  . GERD 09/07/2006  . HYPERLIPIDEMIA 09/07/2006  . HYPOTHYROIDISM 10/30/2006  . Nonspecific abnormal results of liver function study 09/07/2006  . Normal nuclear stress test 2005   stress cardiolite   . OSTEOARTHRITIS 09/07/2006  . Positive PPD    received vaccine in home country- shows positive after receiving  . 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 VITAMIN D DEFICIENCY 08/14/2009   Past Surgical History:  Procedure Laterality Date  . COLONOSCOPY    . [place]   Tumor removed on back of tongue and salivary gland   Social History   Socioeconomic History  . Marital status: Single    Spouse name: Not on file  . Number of children: Not on file  . Years of education: Not on file  . Highest education level: Not on file  Occupational History  . Occupation: PhD Professor  Tobacco Use  . Smoking status: Never Smoker  . Smokeless tobacco: Never Used  Vaping Use  . Vaping Use: Never used  Substance and Sexual Activity  . Alcohol use: Yes    Alcohol/week: 1.0 standard drink    Types: 1 Glasses of wine per week    Comment: rarely  . Drug use: No  . Sexual activity: Not on file  Other Topics Concern  . Not on file  Social History Narrative   HH of 2 Father and her    cat     PhD professor in information systems    No tobacco or alcohol exercising regularly currently   Social Determinants of Health   Financial Resource Strain: Low Risk   . Difficulty of  Paying Living Expenses: Not hard at all  Food Insecurity: Not on file  Transportation Needs: No Transportation Needs  . Lack of Transportation (Medical): No  . Lack of Transportation (Non-Medical): No  Physical Activity: Not on file  Stress: Not on file  Social Connections: Not on file   Family History  Problem Relation Age of Onset  . Stroke Mother 44  . Arthritis Mother   . Rheum arthritis Brother   . Arthritis Brother   . Gout Brother   . Colon cancer Other   . Esophageal cancer Neg Hx   . Stomach cancer Neg Hx   . Rectal cancer Neg Hx    No Known Allergies Prior to Admission medications   Medication Sig Start Date End Date Taking? Authorizing Provider  amoxicillin-clavulanate (AUGMENTIN) 875-125 MG tablet Take 1 tablet by mouth 2 (two) times daily. 03/19/20   Rozetta Nunnery, MD  Ascorbic Acid (VITAMIN C) 500 MG CAPS Take 1 capsule by mouth daily.    [provider]  Blood Glucose Monitoring Suppl (ACCU-CHEK GUIDE) w/Device KIT USE TO CHECK BLOOD SUGAR  TWICE DAILY 12/18/19   Panosh, Standley Brooking, MD  Cholecalciferol 1000 UNITS capsule Take 1 capsule (1,000 Units total) by mouth daily. 10/14/11   Panosh, Standley Brooking, MD  fluticasone (FLONASE) 50 MCG/ACT nasal spray Place 2 sprays into both nostrils daily. 09/13/17   Martin[place], Betty G, MD  glucose blood (ACCU-CHEK AVIVA PLUS) test strip Test twice daily. 04/04/19   Panosh, Standley Brooking, MD  Lancet Devices [facility]) lancets Test twice daily. 06/12/13   Panosh, Standley Brooking, MD  Lancets (ACCU-CHEK SOFT TOUCH) lancets Use as instructed 04/04/19   Panosh, Standley Brooking, MD  levothyroxine (SYNTHROID) 75 MCG tablet TAKE 1 TABLET BY MOUTH 6  DAYS A WEEK MONDAY THROUGH  SATURDAY, DO NOT TAKE ON  SUNDAY 02/18/20   Panosh, Standley Brooking, MD  metFORMIN (GLUCOPHAGE-XR) 500 MG 24 hr tablet TAKE 2 TABLETS BY MOUTH  DAILY 12/18/19   Panosh, Standley Brooking, MD  MULTIPLE VITAMIN PO Take 1 capsule by mouth daily.    [provider]  Omega-3 Fatty Acids (FISH OIL)  435 MG CAPS Take 1 capsule by mouth in the morning and at bedtime.     [provider]  predniSONE (STERAPRED UNI-PAK 21 TAB) 10 MG (21) TBPK tablet Pt on Day 1 take 6 tablets, Day 2 take 5, Day 3 take 4 tablets, day 4 take 3 tablets, day 5 take 2 tablets,day 6 take 1 tablet 03/25/20   Rozetta Nunnery, MD  simvastatin (ZOCOR) 40 MG tablet TAKE 1 TABLET BY MOUTH AT  BEDTIME 07/11/19   Burchette, Alinda Sierras, MD     Physical Exam: Nasal passages were clean with suction.  She has some crusting along the inferior turbinates and nasal pore packing still within the middle meatus bilaterally.   Assessment: S/p fess with turbinate reductions for sinonasal polyps.  Plan: She is doing well.  Recommended use of saline irrigation and gave her sample of NeilMed sinus rinse to use twice a day as well for follow-up in 1 week for recheck and cleaning.   Radene Journey, MD

## 2020-04-27 ENCOUNTER — Encounter (INDEPENDENT_AMBULATORY_CARE_PROVIDER_SITE_OTHER): Payer: Medicare Other | Admitting: Otolaryngology

## 2020-04-28 ENCOUNTER — Ambulatory Visit: Payer: Medicare Other

## 2020-04-30 ENCOUNTER — Other Ambulatory Visit: Payer: Self-pay

## 2020-04-30 ENCOUNTER — Ambulatory Visit (INDEPENDENT_AMBULATORY_CARE_PROVIDER_SITE_OTHER): Payer: Medicare Other

## 2020-04-30 DIAGNOSIS — Z1231 Encounter for screening mammogram for malignant neoplasm of breast: Secondary | ICD-10-CM

## 2020-04-30 DIAGNOSIS — Z1211 Encounter for screening for malignant neoplasm of colon: Secondary | ICD-10-CM | POA: Diagnosis not present

## 2020-04-30 DIAGNOSIS — Z780 Asymptomatic menopausal state: Secondary | ICD-10-CM | POA: Diagnosis not present

## 2020-04-30 DIAGNOSIS — Z0000 Encounter for general adult medical examination without abnormal findings: Secondary | ICD-10-CM

## 2020-04-30 NOTE — Patient Instructions (Signed)
Debbie Marshall , Thank you for taking time to come for your Medicare Wellness Visit. I appreciate your ongoing commitment to your health goals. Please review the following plan we discussed and let me know if I can assist you in the future.   Screening recommendations/referrals: Colonoscopy: Currently due, orders placed this visit  Mammogram: Currently due orders placed this visit  Bone Density: Currently due orders placed this visit  Recommended yearly ophthalmology/optometry visit for glaucoma screening and checkup Recommended yearly dental visit for hygiene and checkup  Vaccinations: Influenza vaccine: Up to date, next due fall 2022  Pneumococcal vaccine: Completed series  Tdap vaccine: Up to date, next due 06/13/2023 Shingles vaccine: Currently due for Shingrix, if you wish to receive we recommend that you do so at your pharmacy as it is less expensive then receiving in the office     Advanced directives: Advance directive discussed with you today. Even though you declined this today please call our office should you change your mind and we can give you the proper paperwork for you to fill out.   Conditions/risks identified: None   Next appointment: 06/10/2020 @ 10:30 am with Dr. Regis Bill    Preventive Care 65 Years and Older, Female Preventive care refers to lifestyle choices and visits with your health care provider that can promote health and wellness. What does preventive care include?  A yearly physical exam. This is also called an annual well check.  Dental exams once or twice a year.  Routine eye exams. Ask your health care provider how often you should have your eyes checked.  Personal lifestyle choices, including:  Daily care of your teeth and gums.  Regular physical activity.  Eating a healthy diet.  Avoiding tobacco and drug use.  Limiting alcohol use.  Practicing safe sex.  Taking low-dose aspirin every day.  Taking vitamin and mineral supplements as  recommended by your health care provider. What happens during an annual well check? The services and screenings done by your health care provider during your annual well check will depend on your age, overall health, lifestyle risk factors, and family history of disease. Counseling  Your health care provider may ask you questions about your:  Alcohol use.  Tobacco use.  Drug use.  Emotional well-being.  Home and relationship well-being.  Sexual activity.  Eating habits.  History of falls.  Memory and ability to understand (cognition).  Work and work Statistician.  Reproductive health. Screening  You may have the following tests or measurements:  Height, weight, and BMI.  Blood pressure.  Lipid and cholesterol levels. These may be checked every 5 years, or more frequently if you are over 38 years old.  Skin check.  Lung cancer screening. You may have this screening every year starting at age 16 if you have a 30-pack-year history of smoking and currently smoke or have quit within the past 15 years.  Fecal occult blood test (FOBT) of the stool. You may have this test every year starting at age 39.  Flexible sigmoidoscopy or colonoscopy. You may have a sigmoidoscopy every 5 years or a colonoscopy every 10 years starting at age 75.  Hepatitis C blood test.  Hepatitis B blood test.  Sexually transmitted disease (STD) testing.  Diabetes screening. This is done by checking your blood sugar (glucose) after you have not eaten for a while (fasting). You may have this done every 1-3 years.  Bone density scan. This is done to screen for osteoporosis. You may have this done  starting at age 53.  Mammogram. This may be done every 1-2 years. Talk to your health care provider about how often you should have regular mammograms. Talk with your health care provider about your test results, treatment options, and if necessary, the need for more tests. Vaccines  Your health care  provider may recommend certain vaccines, such as:  Influenza vaccine. This is recommended every year.  Tetanus, diphtheria, and acellular pertussis (Tdap, Td) vaccine. You may need a Td booster every 10 years.  Zoster vaccine. You may need this after age 76.  Pneumococcal 13-valent conjugate (PCV13) vaccine. One dose is recommended after age 93.  Pneumococcal polysaccharide (PPSV23) vaccine. One dose is recommended after age 56. Talk to your health care provider about which screenings and vaccines you need and how often you need them. This information is not intended to replace advice given to you by your health care provider. Make sure you discuss any questions you have with your health care provider. Document Released: 04/24/2015 Document Revised: 12/16/2015 Document Reviewed: 01/27/2015 Elsevier Interactive Patient Education  2017 [place] Prevention in the Home Falls can cause injuries. They can happen to people of all ages. There are many things you can do to make your home safe and to help prevent falls. What can I do on the outside of my home?  Regularly fix the edges of walkways and driveways and fix any cracks.  Remove anything that might make you trip as you walk through a door, such as a raised step or threshold.  Trim any bushes or trees on the path to your home.  Use bright outdoor lighting.  Clear any walking paths of anything that might make someone trip, such as rocks or tools.  Regularly check to see if handrails are loose or broken. Make sure that both sides of any steps have handrails.  Any raised decks and porches should have guardrails on the edges.  Have any leaves, snow, or ice cleared regularly.  Use sand or salt on walking paths during winter.  Clean up any spills in your garage right away. This includes oil or grease spills. What can I do in the bathroom?  Use night lights.  Install grab bars by the toilet and in the tub and shower. Do  not use towel bars as grab bars.  Use non-skid mats or decals in the tub or shower.  If you need to sit down in the shower, use a plastic, non-slip stool.  Keep the floor dry. Clean up any water that spills on the floor as soon as it happens.  Remove soap buildup in the tub or shower regularly.  Attach bath mats securely with double-sided non-slip rug tape.  Do not have throw rugs and other things on the floor that can make you trip. What can I do in the bedroom?  Use night lights.  Make sure that you have a light by your bed that is easy to reach.  Do not use any sheets or blankets that are too big for your bed. They should not hang down onto the floor.  Have a firm chair that has side arms. You can use this for support while you get dressed.  Do not have throw rugs and other things on the floor that can make you trip. What can I do in the kitchen?  Clean up any spills right away.  Avoid walking on wet floors.  Keep items that you use a lot in easy-to-reach places.  If you need to reach something above you, use a strong step stool that has a grab bar.  Keep electrical cords out of the way.  Do not use floor polish or wax that makes floors slippery. If you must use wax, use non-skid floor wax.  Do not have throw rugs and other things on the floor that can make you trip. What can I do with my stairs?  Do not leave any items on the stairs.  Make sure that there are handrails on both sides of the stairs and use them. Fix handrails that are broken or loose. Make sure that handrails are as long as the stairways.  Check any carpeting to make sure that it is firmly attached to the stairs. Fix any carpet that is loose or worn.  Avoid having throw rugs at the top or bottom of the stairs. If you do have throw rugs, attach them to the floor with carpet tape.  Make sure that you have a light switch at the top of the stairs and the bottom of the stairs. If you do not have them,  ask someone to add them for you. What else can I do to help prevent falls?  Wear shoes that:  Do not have high heels.  Have rubber bottoms.  Are comfortable and fit you well.  Are closed at the toe. Do not wear sandals.  If you use a stepladder:  Make sure that it is fully opened. Do not climb a closed stepladder.  Make sure that both sides of the stepladder are locked into place.  Ask someone to hold it for you, if possible.  Clearly mark and make sure that you can see:  Any grab bars or handrails.  First and last steps.  Where the edge of each step is.  Use tools that help you move around (mobility aids) if they are needed. These include:  Canes.  Walkers.  Scooters.  Crutches.  Turn on the lights when you go into a dark area. Replace any light bulbs as soon as they burn out.  Set up your furniture so you have a clear path. Avoid moving your furniture around.  If any of your floors are uneven, fix them.  If there are any pets around you, be aware of where they are.  Review your medicines with your doctor. Some medicines can make you feel dizzy. This can increase your chance of falling. Ask your doctor what other things that you can do to help prevent falls. This information is not intended to replace advice given to you by your health care provider. Make sure you discuss any questions you have with your health care provider. Document Released: 01/22/2009 Document Revised: 09/03/2015 Document Reviewed: 05/02/2014 Elsevier Interactive Patient Education  2017 [employer].

## 2020-04-30 NOTE — Progress Notes (Signed)
Subjective:   Debbie Marshall is a 75 y.o. female who presents for an Initial Medicare Annual Wellness Visit.  Virtual Visit via Video Note  I connected with Heath Gold on 04/30/20 at  3:30 PM EST by a video enabled telemedicine application and verified that I am speaking with the correct person using two identifiers.  Location: Patient: Home  Provider: office    I discussed the limitations of evaluation and management by telemedicine and the availability of in person appointments. The patient expressed understanding and agreed to proceed.   Ofilia Neas, LPN    Review of Systems    N/A  Cardiac Risk Factors include: advanced age (>61mn, >>48women);dyslipidemia     Objective:    Today's Vitals   There is no height or weight on file to calculate BMI.  Advanced Directives 04/30/2020 12/30/2014 10/23/2013  Does Patient Have a Medical Advance Directive? No No Patient does not have advance directive  Would patient like information on creating a medical advance directive? No - Patient declined - -    Current Medications (verified) Outpatient Encounter Medications as of 04/30/2020  Medication Sig  . Ascorbic Acid (VITAMIN C) 500 MG CAPS Take 1 capsule by mouth daily.  . Blood Glucose Monitoring Suppl (ACCU-CHEK GUIDE) w/Device KIT USE TO CHECK BLOOD SUGAR  TWICE DAILY  . Cholecalciferol 1000 UNITS capsule Take 1 capsule (1,000 Units total) by mouth daily.  . fluticasone (FLONASE) 50 MCG/ACT nasal spray Place 2 sprays into both nostrils daily.  .Marland Kitchenglucose blood (ACCU-CHEK AVIVA PLUS) test strip Test twice daily.  .Elmore GuiseDevices (ACCU-CHEK SOFTCLIX) lancets Test twice daily.  . Lancets (ACCU-CHEK SOFT TOUCH) lancets Use as instructed  . levothyroxine (SYNTHROID) 75 MCG tablet TAKE 1 TABLET BY MOUTH 6  DAYS A WEEK MONDAY THROUGH  SATURDAY, DO NOT TAKE ON  SUNDAY  . metFORMIN (GLUCOPHAGE-XR) 500 MG 24 hr tablet TAKE 2 TABLETS BY MOUTH  DAILY  . MULTIPLE VITAMIN PO Take 1 capsule by  mouth daily.  . Omega-3 Fatty Acids (FISH OIL) 435 MG CAPS Take 1 capsule by mouth in the morning and at bedtime.  . simvastatin (ZOCOR) 40 MG tablet TAKE 1 TABLET BY MOUTH AT  BEDTIME  . [DISCONTINUED] amoxicillin-clavulanate (AUGMENTIN) 875-125 MG tablet Take 1 tablet by mouth 2 (two) times daily.  . [DISCONTINUED] predniSONE (STERAPRED UNI-PAK 21 TAB) 10 MG (21) TBPK tablet Pt on Day 1 take 6 tablets, Day 2 take 5, Day 3 take 4 tablets, day 4 take 3 tablets, day 5 take 2 tablets,day 6 take 1 tablet   No facility-administered encounter medications on file as of 04/30/2020.    Allergies (verified) Patient has no known allergies.   History: Past Medical History:  Diagnosis Date  . ABNORMAL EXAM-BILIARY TRACT 11/14/2008  . ABNORMAL TRANSAMINASE, (LFT'S) 11/14/2008    [place]  . [place]   t[place]ue cancer  . [place]   . FASTING HYPERGLYCEMIA 09/07/2006  . GERD 09/07/2006  . HYPERLIPIDEMIA 09/07/2006  . HYPOTHYROIDISM 10/30/2006  . Nonspecific abnormal results of liver function study 09/07/2006  . Normal nuclear stress test 2005   stress cardiolite   . OSTEOARTHRITIS 09/07/2006  . Positive PPD    received vaccine in home country- shows positive after receiving  . 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 VITAMIN D DEFICIENCY 08/14/2009   Past Surgical History:  Procedure Laterality Date  .  COLONOSCOPY    . NASAL POLYP SURGERY    . [place]   Tumor removed on back of tongue and salivary gland   Family History  Problem Relation Age of Onset  . Stroke Mother 11  . Arthritis Mother   . Rheum arthritis Brother   . Arthritis Brother   . Gout Brother   . Colon cancer Other   . Esophageal cancer Neg Hx   . Stomach cancer Neg Hx   . Rectal cancer Neg Hx    Social History   Socioeconomic History  . Marital status: Single    Spouse name: Not on file  . Number of children: Not  on file  . Years of education: Not on file  . Highest education level: Not on file  Occupational History  . Occupation: PhD Professor  Tobacco Use  . Smoking status: Never Smoker  . Smokeless tobacco: Never Used  Vaping Use  . Vaping Use: Never used  Substance and Sexual Activity  . Alcohol use: Yes    Alcohol/week: 1.0 standard drink    Types: 1 Glasses of wine per week    Comment: rarely  . Drug use: No  . Sexual activity: Not on file  Other Topics Concern  . Not on file  Social History Narrative   HH of 2 Father and her    cat     PhD professor in information systems    No tobacco or alcohol exercising regularly currently   Social Determinants of Health   Financial Resource Strain: Low Risk   . Difficulty of Paying Living Expenses: Not hard at all  Food Insecurity: No Food Insecurity  . Worried About Charity fundraiser in the Last Year: Never true  . Ran Out of Food in the Last Year: Never true  Transportation Needs: No Transportation Needs  . Lack of Transportation (Medical): No  . Lack of Transportation (Non-Medical): No  Physical Activity: Sufficiently Active  . Days of Exercise per Week: 4 days  . Minutes of Exercise per Session: 40 min  Stress: No Stress Concern Present  . Feeling of Stress : Not at all  Social Connections: Moderately Isolated  . Frequency of Communication with Friends and Family: More than three times a week  . Frequency of Social Gatherings with Friends and Family: More than three times a week  . Attends Religious Services: More than 4 times per year  . Active Member of Clubs or Organizations: No  . Attends Archivist Meetings: Never  . Marital Status: Never married    Tobacco Counseling Counseling given: Not Answered   Clinical Intake:     Pain : No/denies pain     Nutritional Risks: None Diabetes: Yes CBG done?: No Did pt. bring in CBG monitor from home?: No  How often do you need to have someone help you when  you read instructions, pamphlets, or other written materials from your doctor or pharmacy?: 1 - Never What is the last grade level you completed in school?: College  Diabetic?Yes Nutrition Risk Assessment:  Has the patient had any N/V/D within the last 2 months?  No  Does the patient have any non-healing wounds?  No  Has the patient had any unintentional weight loss or weight gain?  No   Diabetes:  Is the patient diabetic?  Yes  If diabetic, was a CBG obtained today?  No  Did the patient bring in their glucometer from home?  No  How often do you monitor your CBG's? Patient states checks glucose twice a day.   Financial Strains and Diabetes Management:  Are you having any financial strains with the device, your supplies or your medication? No .  Does the patient want to be seen by Chronic Care Management for management of their diabetes?  No  Would the patient like to be referred to a Nutritionist or for Diabetic Management?  No   Diabetic Exams:  Diabetic Eye Exam: Overdue for diabetic eye exam. Pt has been advised about the importance in completing this exam. Patient advised to call and schedule an eye exam. Diabetic Foot Exam: Completed 02/26/2020   Interpreter Needed?: No  Information entered by :: [place] of Daily Living In your present state of health, do you have any difficulty performing the following activities: 04/30/2020  Hearing? N  Vision? N  Difficulty concentrating or making decisions? N  Walking or climbing stairs? N  Dressing or bathing? N  Doing errands, shopping? N  Preparing Food and eating ? N  Using the Toilet? N  In the past six months, have you accidently leaked urine? N  Do you have problems with loss of bowel control? N  Managing your Medications? N  Managing your Finances? N  Housekeeping or managing your Housekeeping? N  Some recent data might be hidden    Patient Care Team: Panosh, Standley Brooking, MD as PCP -  General Earnie Larsson, [facility] as Pharmacist (Pharmacist)  Indicate any recent Medical Services you may have received from other than [facility] providers in the past year (date may be approximate).     Assessment:   This is a routine wellness examination for [place].  Hearing/Vision screen  Hearing Screening   125Hz  250Hz  500Hz  1000Hz  2000Hz  3000Hz  4000Hz  6000Hz  8000Hz   Right ear:           Left ear:           Vision Screening Comments: Patient gets eyes examine once per year   Dietary issues and exercise activities discussed: Current Exercise Habits: Home exercise routine, Type of exercise: walking, Time (Minutes): 40, Frequency (Times/Week): 4, Weekly Exercise (Minutes/Week): 160, Intensity: Mild  Goals    . Exercise 3x per week (30 min per time)    . Pharmacy Care Plan     CARE PLAN ENTRY (see longitudinal plan of care for additional care plan information)  Current Barriers:  . Chronic Disease Management support, education, and care coordination needs related to Hyperlipidemia, Diabetes, Hypothyroidism, and Osteopenia  Hyperlipidemia Lab Results  Component Value Date/Time   LDLCALC 93 09/06/2019 10:16 AM   LDLDIRECT 163.6 10/23/2006 10:02 AM   . Pharmacist Clinical Goal(s): o Over the next 180 days, patient will work with PharmD and providers to maintain LDL goal < 100 . Current regimen:  . Simvastatin 73m, 1 tablet at bedtime  . Omega-3 fatty acids 10032m  1 capsule in the morning and at bedtime  . Interventions: o We discussed:  diet and exercise extensively  . How to reduce cholesterol through diet/weight management and physical activity.    . We discussed how a diet high in plant sterols (fruits/vegetables/nuts/whole grains/legumes) may reduce your cholesterol.  Encouraged increasing fiber to a daily intake of 10-25g/day . Patient self care activities - Over the next 180 days, patient will: o Continue current medications and work on lifestyle modifications (diet and  exercise).   Diabetes Lab Results  Component Value Date/Time   HGBA1C 6.4 09/06/2019 10:16 AM  HGBA1C 6.6 (H) 01/25/2019 10:46 AM   . Pharmacist Clinical Goal(s): o Over the next 180 days, patient will work with PharmD and providers to maintain A1c goal <7% . Current regimen:  o Metformin ER 572m, 1 tablet once daily  . Interventions: o We discussed: diet and exercise extensively (see above). . Patient self care activities - Over the next 180 days, patient will: o Check blood sugar twice daily, document, and provide at future appointments  Hypothryoidism . Pharmacist Clinical Goal(s) o Over the next 180 days, patient will work with PharmD and providers to acheive TSH: 0.35- 4.50 uIU/mL . Current regimen:  o Levothyroxine 761m, 1 tablet six days a week (do not take on Sunday)  . Interventions: o We discussed:  administration time of levothyroxine (at least 30 minutes before first meal and other medications . Patient self care activities - Over the next 180 days, patient will: o Continue current medication as instructed..   Osteopenia  . Pharmacist Clinical Goal(s) o Over the next 180 days, patient will work with PharmD and providers to maintain bone health and decrease risk of fractures.  . Current regimen:  o Vitamin D3 (cholecalciferol) 1000 units, 1 capsule once daily . Interventions: o Recommend 1200 mg of calcium daily from dietary and supplemental sources. Recommend weight-bearing and muscle strengthening exercises for building and maintaining bone density. . Patient self care activities o Patient will continue current medications and exercises.   Medication management . Pharmacist Clinical Goal(s): o Over the next 180 days, patient will work with PharmD and providers to achieve optimal medication adherence . Current pharmacy: [facility] . Interventions o Comprehensive medication review performed. o Continue current medication management strategy o Recommend pill  box . Patient self care activities - Over the next 180 days, patient will: o Focus on medication adherence by using pill box to help remember taking medications.  o Take medications as prescribed o Report any questions or concerns to PharmD and/or provider(s)  Initial goal documentation       Depression Screen PHQ 2/9 Scores 04/30/2020 02/26/2020 01/25/2019 12/21/2017 07/22/2016 11/03/2014 10/28/2013  PHQ - 2 Score 0 0 0 0 0 0 0  PHQ- 9 Score 0 - - - - - -    Fall Risk Fall Risk  04/30/2020 02/26/2020 01/25/2019 12/21/2017 07/22/2016  Falls in the past year? 0 0 0 No No  Number falls in past yr: 0 0 0 - -  Injury with Fall? 0 0 0 - -  Risk for fall due to : No Fall Risks - - - -  Follow up Falls evaluation completed;Falls prevention discussed - Falls evaluation completed - -    FALL RISK PREVENTION PERTAINING TO THE HOME:  Any stairs in or around the home? No  If so, are there any without handrails? No  Home free of loose throw rugs in walkways, pet beds, electrical cords, etc? Yes  Adequate lighting in your home to reduce risk of falls? Yes   ASSISTIVE DEVICES UTILIZED TO PREVENT FALLS:  Life alert? No  Use of a cane, walker or w/c? No  Grab bars in the bathroom? Yes  Shower chair or bench in shower? Yes  Elevated toilet seat or a handicapped toilet? No    Cognitive Function:     Normal cognitive status assessed by direct observation by this Nurse Health Advisor. No abnormalities found.      Immunizations Immunization History  Administered Date(s) Administered  . Fluad Quad(high Dose 65+) 01/25/2019, 02/26/2020  .  Influenza Split 04/24/2012  . Influenza Whole 02/19/2010  . Influenza, High Dose Seasonal PF 04/02/2015, 05/27/2016, 12/06/2016, 12/21/2017  . Influenza,inj,Quad PF,6+ Mos 04/18/2013, 02/27/2014  . Influenza,inj,quad, With Preservative 01/07/2019  . PFIZER(Purple Top)SARS-COV-2 Vaccination 06/20/2019, 07/10/2019, 03/11/2020  . PPD Test 07/02/2018  .  Pneumococcal Conjugate-13 11/03/2014  . Pneumococcal Polysaccharide-23 10/24/2012  . Tdap 06/12/2013  . Zoster 06/12/2013    TDAP status: Up to date  Flu Vaccine status: Up to date  Pneumococcal vaccine status: Up to date  Covid-19 vaccine status: Completed vaccines  Qualifies for Shingles Vaccine? Yes   Zostavax completed Yes   Shingrix Completed?: No.    Education has been provided regarding the importance of this vaccine. Patient has been advised to call insurance company to determine out of pocket expense if they have not yet received this vaccine. Advised may also receive vaccine at local pharmacy or Health Dept. Verbalized acceptance and understanding.  Screening Tests Health Maintenance  Topic Date Due  . OPHTHALMOLOGY EXAM  11/19/2015  . COLONOSCOPY (Pts 45-26yr Insurance coverage will need to be confirmed)  01/12/2018  . HEMOGLOBIN A1C  08/25/2020  . URINE MICROALBUMIN  09/05/2020  . MAMMOGRAM  01/22/2021  . FOOT EXAM  02/25/2021  . TETANUS/TDAP  06/13/2023  . INFLUENZA VACCINE  Completed  . DEXA SCAN  Completed  . COVID-19 Vaccine  Completed  . Hepatitis C Screening  Completed  . PNA vac Low Risk Adult  Completed    Health Maintenance  Health Maintenance Due  Topic Date Due  . OPHTHALMOLOGY EXAM  11/19/2015  . COLONOSCOPY (Pts 45-492yrInsurance coverage will need to be confirmed)  01/12/2018    Colorectal cancer screening: Type of screening: Colonoscopy. Completed 01/13/2015. Repeat every 10 years  Mammogram status: Ordered 04/30/2020. Pt provided with contact info and advised to call to schedule appt.   Bone Density status: Ordered 04/30/2020. Pt provided with contact info and advised to call to schedule appt.  Lung Cancer Screening: (Low Dose CT Chest recommended if Age 73-80ears, 30 pack-year currently smoking OR have quit w/in 15years.) does not qualify.   Lung Cancer Screening Referral: N/A  Additional Screening:  Hepatitis C Screening: does  qualify; Completed 09/16/2008  Vision Screening: Recommended annual ophthalmology exams for early detection of glaucoma and other disorders of the eye. Is the patient up to date with their annual eye exam?  Yes  Who is the provider or what is the name of the office in which the patient attends annual eye exams? [facility]  If pt is not established with a provider, would they like to be referred to a provider to establish care? No .   Dental Screening: Recommended annual dental exams for proper oral hygiene  Community Resource Referral / Chronic Care Management: CRR required this visit?  No   CCM required this visit?  No      Plan:     I have personally reviewed and noted the following in the patient's chart:   . Medical and social history . Use of alcohol, tobacco or illicit drugs  . Current medications and supplements . Functional ability and status . Nutritional status . Physical activity . Advanced directives . List of other physicians . Hospitalizations, surgeries, and ER visits in previous 12 months . Vitals . Screenings to include cognitive, depression, and falls . Referrals and appointments  In addition, I have reviewed and discussed with patient certain preventive protocols, quality metrics, and best practice recommendations. A written personalized care plan for  preventive services as well as general preventive health recommendations were provided to patient.     Ofilia Neas, LPN   06/04/95   Nurse Notes: None

## 2020-05-06 ENCOUNTER — Other Ambulatory Visit: Payer: Self-pay

## 2020-05-06 ENCOUNTER — Ambulatory Visit (INDEPENDENT_AMBULATORY_CARE_PROVIDER_SITE_OTHER): Payer: Medicare Other | Admitting: Otolaryngology

## 2020-05-06 VITALS — Temp 97.3°F

## 2020-05-06 DIAGNOSIS — Z4889 Encounter for other specified surgical aftercare: Secondary | ICD-10-CM

## 2020-05-06 NOTE — Progress Notes (Signed)
HPI: Debbie Marshall is a 75 y.o. female who presents 18 days s/p F ESS with removal of sinonasal polyps and turbinate reductions.  She is breathing much better.  She has had no further bleeding.  She has been using saline irrigations..   Past Medical History:  Diagnosis Date  . ABNORMAL EXAM-BILIARY TRACT 11/14/2008  . ABNORMAL TRANSAMINASE, (LFT'S) 11/14/2008    2005 US shows fatty liver  . Cancer ([place]) 1996   tongue cancer  . Diabetes mellitus without complication ([place])   . FASTING HYPERGLYCEMIA 09/07/2006  . GERD 09/07/2006  . HYPERLIPIDEMIA 09/07/2006  . HYPOTHYROIDISM 10/30/2006  . Nonspecific abnormal results of liver function study 09/07/2006  . Normal nuclear stress test 2005   stress cardiolite   . OSTEOARTHRITIS 09/07/2006  . Positive PPD    [place]- shows positive after receiving  . 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 VITAMIN D DEFICIENCY 08/14/2009   Past Surgical History:  Procedure Laterality Date  . COLONOSCOPY    . NASAL POLYP SURGERY    . [place]   Tumor removed on back of tongue and salivary gland   Social History   Socioeconomic History  . Marital status: Single    Spouse name: Not on file  . Number of children: Not on file  . Years of education: Not on file  . Highest education level: Not on file  Occupational History  . Occupation: PhD Professor  Tobacco Use  . Smoking status: Never Smoker  . Smokeless tobacco: Never Used  Vaping Use  . Vaping Use: Never used  Substance and Sexual Activity  . Alcohol use: Yes    Alcohol/week: 1.0 standard drink    Types: 1 Glasses of wine per week    Comment: rarely  . Drug use: No  . Sexual activity: Not on file  Other Topics Concern  . Not on file  Social History Narrative   HH of 2 Father and her    cat     PhD professor in information systems    No tobacco or alcohol exercising regularly currently   Social Determinants of Health    Financial Resource Strain: Low Risk   . Difficulty of Paying Living Expenses: Not hard at all  Food Insecurity: No Food Insecurity  . Worried About Charity fundraiser in the Last Year: Never true  . Ran Out of Food in the Last Year: Never true  Transportation Needs: No Transportation Needs  . Lack of Transportation (Medical): No  . Lack of Transportation (Non-Medical): No  Physical Activity: Sufficiently Active  . Days of Exercise per Week: 4 days  . Minutes of Exercise per Session: 40 min  Stress: No Stress Concern Present  . Feeling of Stress : Not at all  Social Connections: Moderately Isolated  . Frequency of Communication with Friends and Family: More than three times a week  . Frequency of Social Gatherings with Friends and Family: More than three times a week  . Attends Religious Services: More than 4 times per year  . Active Member of Clubs or Organizations: No  . Attends Archivist Meetings: Never  . Marital Status: Never married   Family History  Problem Relation Age of Onset  . Stroke Mother 75  . Arthritis Mother   . Rheum arthritis Brother   . Arthritis Brother   . Gout Brother   . Colon cancer  Other   . Esophageal cancer Neg Hx   . Stomach cancer Neg Hx   . Rectal cancer Neg Hx    No Known Allergies Prior to Admission medications   Medication Sig Start Date End Date Taking? Authorizing Provider  Ascorbic Acid (VITAMIN C) 500 MG CAPS Take 1 capsule by mouth daily.    [provider]  Blood Glucose Monitoring Suppl (ACCU-CHEK GUIDE) w/Device KIT USE TO CHECK BLOOD SUGAR  TWICE DAILY 12/18/19   Panosh, Standley Brooking, MD  Cholecalciferol 1000 UNITS capsule Take 1 capsule (1,000 Units total) by mouth daily. 10/14/11   Panosh, Standley Brooking, MD  fluticasone (FLONASE) 50 MCG/ACT nasal spray Place 2 sprays into both nostrils daily. 09/13/17   [place], Betty G, MD  glucose blood (ACCU-CHEK AVIVA PLUS) test strip Test twice daily. 04/04/19   Panosh, Standley Brooking, MD   Lancet Devices [facility]) lancets Test twice daily. 06/12/13   Panosh, Standley Brooking, MD  Lancets (ACCU-CHEK SOFT TOUCH) lancets Use as instructed 04/04/19   Panosh, Standley Brooking, MD  levothyroxine (SYNTHROID) 75 MCG tablet TAKE 1 TABLET BY MOUTH 6  DAYS A WEEK MONDAY THROUGH  SATURDAY, DO NOT TAKE ON  SUNDAY 02/18/20   Panosh, Standley Brooking, MD  metFORMIN (GLUCOPHAGE-XR) 500 MG 24 hr tablet TAKE 2 TABLETS BY MOUTH  DAILY 12/18/19   Panosh, Standley Brooking, MD  MULTIPLE VITAMIN PO Take 1 capsule by mouth daily.    [provider]  Omega-3 Fatty Acids (FISH OIL) 435 MG CAPS Take 1 capsule by mouth in the morning and at bedtime.    [provider]  simvastatin (ZOCOR) 40 MG tablet TAKE 1 TABLET BY MOUTH AT  BEDTIME 07/11/19   Burchette, Alinda Sierras, MD     Physical Exam: On nasal exam both middle meatus regions are widely patent with a little bit of scabbing and crusting that was removed with suction.  She also has some crusting along the inferior turbinates.  Nasal passages otherwise clear.   Assessment: S/p FESS for sinonasal polyps.  Plan: Recommend continue use of the saline irrigations on a daily basis and will follow up in 2 weeks for recheck and cleaning.  At that point consider treatment with nasal steroid spray as she has had history of polyps.   Radene Journey, MD

## 2020-05-11 ENCOUNTER — Other Ambulatory Visit: Payer: Self-pay | Admitting: Internal Medicine

## 2020-05-11 DIAGNOSIS — E119 Type 2 diabetes mellitus without complications: Secondary | ICD-10-CM

## 2020-05-13 ENCOUNTER — Encounter: Payer: Self-pay | Admitting: Internal Medicine

## 2020-05-20 ENCOUNTER — Other Ambulatory Visit: Payer: Self-pay

## 2020-05-20 ENCOUNTER — Encounter (INDEPENDENT_AMBULATORY_CARE_PROVIDER_SITE_OTHER): Payer: Self-pay | Admitting: Otolaryngology

## 2020-05-20 ENCOUNTER — Ambulatory Visit (INDEPENDENT_AMBULATORY_CARE_PROVIDER_SITE_OTHER): Payer: Medicare Other | Admitting: Otolaryngology

## 2020-05-20 VITALS — Temp 96.8°F

## 2020-05-20 DIAGNOSIS — Z4889 Encounter for other specified surgical aftercare: Secondary | ICD-10-CM

## 2020-05-20 NOTE — Progress Notes (Signed)
HPI: Debbie Marshall is a 75 y.o. female who presents 6 weeks days s/p FESS with removal of sinonasal polyps.  Her smell has returned and she is breathing much better..   Past Medical History:  Diagnosis Date  . ABNORMAL EXAM-BILIARY TRACT 11/14/2008  . ABNORMAL TRANSAMINASE, (LFT'S) 11/14/2008    2005 US shows fatty liver  . Cancer ([place]) 1996   tongue cancer  . Diabetes mellitus without complication ([place])   . FASTING HYPERGLYCEMIA 09/07/2006  . GERD 09/07/2006  . HYPERLIPIDEMIA 09/07/2006  . HYPOTHYROIDISM 10/30/2006  . Nonspecific abnormal results of liver function study 09/07/2006  . Normal nuclear stress test 2005   stress cardiolite   . OSTEOARTHRITIS 09/07/2006  . Positive PPD    received vaccine [place] home [place]- shows positive after receiving  . 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 VITAMIN D DEFICIENCY 08/14/2009   Past Surgical History:  Procedure Laterality Date  . COLONOSCOPY    . NASAL POLYP SURGERY    . [place]   Tumor removed on back of tongue and salivary gland   Social History   Socioeconomic History  . Marital status: Single    Spouse name: Not on file  . Number of children: Not on file  . Years of education: Not on file  . Highest education level: Not on file  Occupational History  . Occupation: PhD Professor  Tobacco Use  . Smoking status: Never Smoker  . Smokeless tobacco: Never Used  Vaping Use  . Vaping Use: Never used  Substance and Sexual Activity  . Alcohol use: Yes    Alcohol/week: 1.0 standard drink    Types: 1 Glasses of wine per week    Comment: rarely  . Drug use: No  . Sexual activity: Not on file  Other Topics Concern  . Not on file  Social History Narrative   HH of 2 Father and her    cat     PhD professor in information systems    No tobacco or alcohol exercising regularly currently   Social Determinants of Health   Financial Resource Strain: Low Risk   . Difficulty of Paying Living  Expenses: Not hard at all  Food Insecurity: No Food Insecurity  . Worried About Charity fundraiser in the Last Year: Never true  . Ran Out of Food in the Last Year: Never true  Transportation Needs: No Transportation Needs  . Lack of Transportation (Medical): No  . Lack of Transportation (Non-Medical): No  Physical Activity: Sufficiently Active  . Days of Exercise per Week: 4 days  . Minutes of Exercise per Session: 40 min  Stress: No Stress Concern Present  . Feeling of Stress : Not at all  Social Connections: Moderately Isolated  . Frequency of Communication with Friends and Family: More than three times a week  . Frequency of Social Gatherings with Friends and Family: More than three times a week  . Attends Religious Services: More than 4 times per year  . Active Member of Clubs or Organizations: No  . Attends Archivist Meetings: Never  . Marital Status: Never married   Family History  Problem Relation Age of Onset  . Stroke Mother 63  . Arthritis Mother   . Rheum arthritis Brother   . Arthritis Brother   . Gout Brother   . Colon cancer Other   . Esophageal cancer Neg Hx   . Stomach  cancer Neg Hx   . Rectal cancer Neg Hx    No Known Allergies Prior to Admission medications   Medication Sig Start Date End Date Taking? Authorizing Provider  ACCU-CHEK GUIDE test strip TEST TWICE DAILY 05/11/20   Panosh, Standley Brooking, MD  Accu-Chek Softclix Lancets lancets TEST TWICE DAILY AS  DIRECTED 05/11/20   Panosh, Standley Brooking, MD  Ascorbic Acid (VITAMIN C) 500 MG CAPS Take 1 capsule by mouth daily.    [provider]  Blood Glucose Monitoring Suppl (ACCU-CHEK GUIDE) w/Device KIT USE TO CHECK BLOOD SUGAR  TWICE DAILY 12/18/19   Panosh, Standley Brooking, MD  Cholecalciferol 1000 UNITS capsule Take 1 capsule (1,000 Units total) by mouth daily. 10/14/11   Panosh, Standley Brooking, MD  fluticasone (FLONASE) 50 MCG/ACT nasal spray Place 2 sprays into both nostrils daily. 09/13/17   Martin[place], Betty G, MD   Lancet Devices [facility]) lancets Test twice daily. 06/12/13   Panosh, Standley Brooking, MD  levothyroxine (SYNTHROID) 75 MCG tablet TAKE 1 TABLET BY MOUTH 6  DAYS A WEEK MONDAY THROUGH  SATURDAY, DO NOT TAKE ON  SUNDAY 02/18/20   Panosh, Standley Brooking, MD  metFORMIN (GLUCOPHAGE-XR) 500 MG 24 hr tablet TAKE 2 TABLETS BY MOUTH  DAILY 12/18/19   Panosh, Standley Brooking, MD  MULTIPLE VITAMIN PO Take 1 capsule by mouth daily.    [provider]  Omega-3 Fatty Acids (FISH OIL) 435 MG CAPS Take 1 capsule by mouth in the morning and at bedtime.    [provider]  simvastatin (ZOCOR) 40 MG tablet TAKE 1 TABLET BY MOUTH AT  BEDTIME 07/11/19   Burchette, Alinda Sierras, MD     Physical Exam: Both middle meatus regions are widely patent nasal passages are clear.  Minimal crusting.   Assessment: S/p status post removal of sinonasal polyps doing well.  Plan: Reviewed with her concerning use of Flonase during the spring and fall or when she has any congestion. Otherwise she will follow-up as needed   Radene Journey, MD

## 2020-06-10 ENCOUNTER — Other Ambulatory Visit: Payer: Self-pay

## 2020-06-10 ENCOUNTER — Encounter: Payer: Self-pay | Admitting: Internal Medicine

## 2020-06-10 ENCOUNTER — Ambulatory Visit (INDEPENDENT_AMBULATORY_CARE_PROVIDER_SITE_OTHER): Payer: Medicare Other | Admitting: Internal Medicine

## 2020-06-10 VITALS — BP 116/68 | HR 64 | Temp 98.2°F | Resp 16 | Wt 117.6 lb

## 2020-06-10 DIAGNOSIS — E039 Hypothyroidism, unspecified: Secondary | ICD-10-CM | POA: Diagnosis not present

## 2020-06-10 DIAGNOSIS — E118 Type 2 diabetes mellitus with unspecified complications: Secondary | ICD-10-CM | POA: Diagnosis not present

## 2020-06-10 DIAGNOSIS — R200 Anesthesia of skin: Secondary | ICD-10-CM | POA: Diagnosis not present

## 2020-06-10 DIAGNOSIS — M5410 Radiculopathy, site unspecified: Secondary | ICD-10-CM | POA: Diagnosis not present

## 2020-06-10 DIAGNOSIS — Z79899 Other long term (current) drug therapy: Secondary | ICD-10-CM | POA: Diagnosis not present

## 2020-06-10 LAB — POCT GLYCOSYLATED HEMOGLOBIN (HGB A1C): Hemoglobin A1C: 5.9 % — AB (ref 4.0–5.6)

## 2020-06-10 NOTE — Patient Instructions (Addendum)
A1c is much better   5.9 Will have  Referral team look at the referral to Dr Ellene Route .   Plan cpx and labs  In November when due  Advise still use Flonase for allergy control and add on antihistamine as needed .

## 2020-06-10 NOTE — Progress Notes (Signed)
Chief Complaint  Patient presents with  . Follow-up  . Diabetes  . Hypertension  . Medication Management    HPI: Debbie Marshall 75 y.o. come in for Chronic disease management    F/u dm at that time  Metformin inc to 1500 per day   ocass  ocass  200 pp usually  Below 180   Sinus surgery .   Helpful  Can smell now   Still using flonase   May have  Allergy  From cat ? Antihistamine   Never got contacted about the NS consult   Right foot still numb but able to walk ok back  B[facility]   Dr Rolena Infante  And then numbness  Here and there.  No  Weakness or walking .  Ns referral for her  Back predicament with foot numbness   Has lost weight diet changes   ROS: See pertinent positives and negatives per HPI.  Past Medical History:  Diagnosis Date  . ABNORMAL EXAM-BILIARY TRACT 11/14/2008  . ABNORMAL TRANSAMINASE, (LFT'S) 11/14/2008    2005 US shows fatty liver  . Cancer ([place]) 1996   tongue cancer  . Diabetes mellitus without complication ([place])   . FASTING HYPERGLYCEMIA 09/07/2006  . GERD 09/07/2006  . HYPERLIPIDEMIA 09/07/2006  . HYPOTHYROIDISM 10/30/2006  . Nonspecific abnormal results of liver function study 09/07/2006  . Normal nuclear stress test 2005   stress cardiolite   . OSTEOARTHRITIS 09/07/2006  . Positive PPD    received vaccine [place] home country- shows positive after receiving  . 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 VITAMIN D DEFICIENCY 08/14/2009    Family History  Problem Relation Age of Onset  . Stroke Mother 107  . Arthritis Mother   . Rheum arthritis Brother   . Arthritis Brother   . Gout Brother   . Colon cancer Other   . Esophageal cancer Neg Hx   . Stomach cancer Neg Hx   . Rectal cancer Neg Hx     Social History   Socioeconomic History  . Marital status: Single    Spouse name: Not on file  . Number of children: Not on file  . Years of education: Not on file  . Highest education level: Not on file  Occupational  History  . Occupation: PhD Professor  Tobacco Use  . Smoking status: Never Smoker  . Smokeless tobacco: Never Used  Vaping Use  . Vaping Use: Never used  Substance and Sexual Activity  . Alcohol use: Yes    Alcohol/week: 1.0 standard drink    Types: 1 Glasses of wine per week    Comment: rarely  . Drug use: No  . Sexual activity: Not on file  Other Topics Concern  . Not on file  Social History Narrative   HH of 2 Father and her    cat     PhD professor in information systems    No tobacco or alcohol exercising regularly currently   Social Determinants of Health   Financial Resource Strain: Low Risk   . Difficulty of Paying Living Expenses: Not hard at all  Food Insecurity: No Food Insecurity  . Worried About Charity fundraiser in the Last Year: Never true  . Ran Out of Food in the Last Year: Never true  Transportation Needs: No Transportation Needs  . Lack of Transportation (Medical): No  . Lack of Transportation (Non-Medical): No  Physical Activity: Sufficiently Active  .  Days of Exercise per Week: 4 days  . Minutes of Exercise per Session: 40 min  Stress: No Stress Concern Present  . Feeling of Stress : Not at all  Social Connections: Moderately Isolated  . Frequency of Communication with Friends and Family: More than three times a week  . Frequency of Social Gatherings with Friends and Family: More than three times a week  . Attends Religious Services: More than 4 times per year  . Active Member of Clubs or Organizations: No  . Attends Archivist Meetings: Never  . Marital Status: Never married    Outpatient Medications Prior to Visit  Medication Sig Dispense Refill  . ACCU-CHEK GUIDE test strip TEST TWICE DAILY 200 strip 3  . Accu-Chek Softclix Lancets lancets TEST TWICE DAILY AS  DIRECTED 200 each 3  . Ascorbic Acid (VITAMIN C) 500 MG CAPS Take 1 capsule by mouth daily.    . Blood Glucose Monitoring Suppl (ACCU-CHEK GUIDE) w/Device KIT USE TO CHECK  BLOOD SUGAR  TWICE DAILY 1 kit 1  . Cholecalciferol 1000 UNITS capsule Take 1 capsule (1,000 Units total) by mouth daily. 90 capsule 3  . fluticasone (FLONASE) 50 MCG/ACT nasal spray Place 2 sprays into both nostrils daily. 16 g 2  . Lancet Devices (ACCU-CHEK SOFTCLIX) lancets Test twice daily. 1 each 12  . levothyroxine (SYNTHROID) 75 MCG tablet TAKE 1 TABLET BY MOUTH 6  DAYS A WEEK MONDAY THROUGH  SATURDAY, DO NOT TAKE ON  SUNDAY 78 tablet 3  . metFORMIN (GLUCOPHAGE-XR) 500 MG 24 hr tablet TAKE 2 TABLETS BY MOUTH  DAILY 180 tablet 1  . MULTIPLE VITAMIN PO Take 1 capsule by mouth daily.    . Omega-3 Fatty Acids (FISH OIL) 435 MG CAPS Take 1 capsule by mouth in the morning and at bedtime.    . simvastatin (ZOCOR) 40 MG tablet TAKE 1 TABLET BY MOUTH AT  BEDTIME 90 tablet 3   No facility-administered medications prior to visit.     EXAM:  BP 116/68   Pulse 64   Temp 98.2 F (36.8 C)   Resp 16   Wt 117 lb 9.6 oz (53.3 kg)   SpO2 95%   BMI 23.35 kg/m   Body mass index is 23.35 kg/m.  GENERAL: vitals reviewed and listed above, alert, oriented, appears well hydrated and in no acute distress HEENT: atraumatic, conjunctiva  clear, no obvious abnormalities on inspection of external nose and ears OP :masked NECK: no obvious masses on inspection palpation  LUNGS: clear to auscultation bilaterally, no wheezes, rales or rhonchi, good air movement CV: HRRR, no clubbing cyanosis or  peripheral edema nl cap refill  MS: moves all extremities without noticeable focal  Abnormality Neuro  dtrs intact  Dec sens right lateral foot   No obv weakness  PSYCH: pleasant and cooperative, no obvious depression or anxiety Lab Results  Component Value Date   WBC 6.9 09/06/2019   HGB 14.5 09/06/2019   HCT 43.4 09/06/2019   PLT 221.0 09/06/2019   GLUCOSE 92 02/26/2020   CHOL 126 02/26/2020   TRIG 113 02/26/2020   HDL 47 (L) 02/26/2020   LDLDIRECT 163.6 10/23/2006   LDLCALC 59 02/26/2020   ALT 15  09/06/2019   AST 20 09/06/2019   NA 143 02/26/2020   K 5.2 02/26/2020   CL 104 02/26/2020   CREATININE 0.70 02/26/2020   BUN 19 02/26/2020   CO2 29 02/26/2020   TSH 0.81 02/26/2020   INR 1.0 ratio 11/14/2008  HGBA1C 5.9 (A) 06/10/2020   MICROALBUR 1.2 09/06/2019   BP Readings from Last 3 Encounters:  06/10/20 116/68  02/27/20 130/70  01/06/20 (!) 115/59    ASSESSMENT AND PLAN:  Discussed the following assessment and plan:  Controlled type 2 diabetes mellitus with complication, without long-term current use of insulin (HCC) - improved  a1c! cont lsi and meds  - Plan: POC HgB A1c  Medication management  Numbness of foot  Back pain with right-sided radiculopathy  Hypothyroidism, unspecified type  BP  at goal  Cont numbness and concern about foot numbness and back   Still would like another opinion.  Referral was placed in December not sure  About follow through  reviewed tsh on thyroid med no explanation for lower tsh from previous   But still in range will  Re check at cpx  In fall   Due last cpx November  Urine micro may   -Patient advised to return or notify health care team  if  new concerns arise.  Patient Instructions  A1c is much better   5.9 Will have  Referral team look at the referral to Dr Ellene Route .   Plan cpx and labs  In November when due  Advise still use Flonase for allergy control and add on antihistamine as needed .     Standley Brooking. Victoriano Campion M.D.

## 2020-06-29 ENCOUNTER — Other Ambulatory Visit: Payer: Self-pay

## 2020-06-29 ENCOUNTER — Ambulatory Visit (AMBULATORY_SURGERY_CENTER): Payer: Medicare Other | Admitting: *Deleted

## 2020-06-29 VITALS — Ht 59.5 in | Wt 115.0 lb

## 2020-06-29 DIAGNOSIS — Z86010 Personal history of colonic polyps: Secondary | ICD-10-CM

## 2020-06-29 MED ORDER — SUPREP BOWEL PREP KIT 17.5-3.13-1.6 GM/177ML PO SOLN: 1.0000 | Freq: Once | ORAL | 0 refills | Status: AC

## 2020-06-29 NOTE — Progress Notes (Signed)

## 2020-06-30 ENCOUNTER — Encounter: Payer: Self-pay | Admitting: Internal Medicine

## 2020-07-06 DIAGNOSIS — Z20822 Contact with and (suspected) exposure to covid-19: Secondary | ICD-10-CM | POA: Diagnosis not present

## 2020-07-07 ENCOUNTER — Other Ambulatory Visit: Payer: Self-pay

## 2020-07-07 ENCOUNTER — Ambulatory Visit
Admission: RE | Admit: 2020-07-07 | Discharge: 2020-07-07 | Disposition: A | Discharge status: Home / Self Care | Payer: Medicare Other | Source: Ambulatory Visit | Attending: Internal Medicine | Admitting: Internal Medicine

## 2020-07-07 DIAGNOSIS — Z1231 Encounter for screening mammogram for malignant neoplasm of breast: Secondary | ICD-10-CM

## 2020-07-09 ENCOUNTER — Encounter: Payer: Medicare Other | Admitting: Internal Medicine

## 2020-07-10 ENCOUNTER — Telehealth (INDEPENDENT_AMBULATORY_CARE_PROVIDER_SITE_OTHER): Payer: Self-pay | Admitting: Otolaryngology

## 2020-07-10 ENCOUNTER — Other Ambulatory Visit (INDEPENDENT_AMBULATORY_CARE_PROVIDER_SITE_OTHER): Payer: Self-pay | Admitting: Otolaryngology

## 2020-07-10 MED ORDER — AMOXICILLIN-POT CLAVULANATE 875-125 MG PO TABS: 1.0000 | ORAL_TABLET | Freq: Two times a day (BID) | ORAL | 0 refills | Status: DC

## 2020-07-10 NOTE — Telephone Encounter (Signed)
Patient called concerning recent headache for the past week or 2 that comes and goes.  She is also has some thick mucus that is little bit colored.  She has stopped her saline rinses and steroid sprays. I prescribed Augmentin 875 mg twice daily for 10 days and suggested using the saline rinse and nasal steroid spray daily. She will notify us if symptoms have not resolved following completion of the antibiotic.

## 2020-07-12 ENCOUNTER — Other Ambulatory Visit: Payer: Self-pay | Admitting: Family Medicine

## 2020-07-12 ENCOUNTER — Other Ambulatory Visit: Payer: Self-pay | Admitting: Internal Medicine

## 2020-07-13 NOTE — Telephone Encounter (Signed)
Dr. Elease Hashimoto is the prescribing provider.

## 2020-07-22 DIAGNOSIS — M5441 Lumbago with sciatica, right side: Secondary | ICD-10-CM | POA: Diagnosis not present

## 2020-07-22 DIAGNOSIS — G8929 Other chronic pain: Secondary | ICD-10-CM | POA: Diagnosis not present

## 2020-07-29 DIAGNOSIS — E119 Type 2 diabetes mellitus without complications: Secondary | ICD-10-CM | POA: Diagnosis not present

## 2020-07-29 LAB — HM DIABETES EYE EXAM

## 2020-07-31 ENCOUNTER — Encounter: Payer: Self-pay | Admitting: Internal Medicine

## 2020-08-06 ENCOUNTER — Telehealth: Payer: Self-pay

## 2020-08-06 NOTE — Chronic Care Management (AMB) (Addendum)
Chronic Care Management Pharmacy Assistant   Name: Debbie Marshall  MRN: [id number] DOB: 1945-07-15  Reason for Encounter: Disease State/ Hyperlipidemia Assessment Call.   Conditions to be addressed/monitored: HLD  Recent office visits:  02/26/20 Standley Brooking. Panosh MD (PCP) - seen for encounter for preventative adult care exam. Discontinued amoxicillin- pot clavulanate 875-138m and prednisone 276m Follow up depending on labs.  06/10/20 WaStandley BrookingPanosh MD (PCP) - seen for controlled type 2 diabetes, medication management, numbness of foot, back pain and hypothyroidism. No medication changes.   Recent consult visits:   02/06/20 ChRozetta NunneryD (Otolaryngology) - seen for chronic rhinitis and nasal polyps. No medication changes. Will follow up upon worsening of symptoms for possible surgery. Referral to endocrinology Dr. ElEllene RouteLabs for November ordered and follow up as needed.   03/25/20 ChRozetta NunneryD (Otolaryngology) - seen for nasal polyps and chronic sinusitis. Added a sterapred 1057m day dosepack and ordered a CT scan of sinus. Will schedule sinus surgery after holidays.   04/20/20 ChrRozetta Nunnery (Otolaryngology) - seen for aftercare following surgery. Patient to use saline irrigation system twice daily and follow up for recheck in 1 week and cleaning.   05/06/20 ChrRozetta Nunnery (Otolaryngology) - seen for aftercare following surgery. Recommend continue use of the saline irrigations on a daily basis and will follow up in 2 weeks for recheck and cleaning.  At that point consider treatment with nasal steroid spray as she has had history of polyps per note.   05/20/20 ChrRozetta Nunnery (Otolaryngology) - seen for aftercare following surgery. No medication changes. Patient instructed to follow up as needed.   07/22/20 JosErline Levine ([facility] - seen for chronic low back pain, herniated disc and hypertension. No medication changes. No  follow up noted.    Hospital visits:  None in previous 6 months  Medications: Outpatient Encounter Medications as of 08/06/2020  Medication Sig  . simvastatin (ZOCOR) 40 MG tablet TAKE 1 TABLET BY MOUTH AT  BEDTIME  . ACCU-CHEK GUIDE test strip TEST TWICE DAILY  . Accu-Chek Softclix Lancets lancets TEST TWICE DAILY AS  DIRECTED  . amoxicillin-clavulanate (AUGMENTIN) 875-125 MG tablet Take 1 tablet by mouth 2 (two) times daily.  . Ascorbic Acid (VITAMIN C) 500 MG CAPS Take 1 capsule by mouth daily.  . Blood Glucose Monitoring Suppl (ACCU-CHEK GUIDE) w/Device KIT USE TO CHECK BLOOD SUGAR  TWICE DAILY  . Cholecalciferol 1000 UNITS capsule Take 1 capsule (1,000 Units total) by mouth daily.  . fluticasone (FLONASE) 50 MCG/ACT nasal spray Place 2 sprays into both nostrils daily.  . LElmore Guisevices (ACCU-CHEK SOFTCLIX) lancets Test twice daily.  . lMarland Kitchenvothyroxine (SYNTHROID) 75 MCG tablet TAKE 1 TABLET BY MOUTH 6  DAYS A WEEK MONDAY THROUGH  SATURDAY, DO NOT TAKE ON  SUNDAY  . LUTEIN PO Take 40 mg by mouth daily.  . metFORMIN (GLUCOPHAGE-XR) 500 MG 24 hr tablet TAKE 2 TABLETS BY MOUTH  DAILY  . MULTIPLE VITAMIN PO Take 1 capsule by mouth daily.  . Omega-3 Fatty Acids (FISH OIL) 435 MG CAPS Take 1 capsule by mouth in the morning and at bedtime.   No facility-administered encounter medications on file as of 08/06/2020.    Comprehensive medication review performed; Spoke to patient regarding cholesterol.  Lipid Panel    Component Value Date/Time   CHOL 126 02/26/2020 1254   TRIG 113 02/26/2020 1254   HDL 47 (L) 02/26/2020 1254   LDLCALC  59 02/26/2020 1254   LDLDIRECT 163.6 10/23/2006 1002    10-year ASCVD risk score: The ASCVD Risk score Mikey Bussing DC Jr., et al., 2013) failed to calculate for the following reasons:   The valid total cholesterol range is 130 to 320 mg/dL  . Current antihyperlipidemic regimen:  o Simvastatin 32m -  Take one tablet at bedtime.  o Omega 3 fish oil 4339m - take  1 capsule by mouth in the morning and at bedtime.   . Previous antihyperlipidemic medications tried: none.   . ASCVD risk enhancing conditions: age >6>11nd DM  . What recent interventions/DTPs have been made by any provider to improve Cholesterol control since last CPP Visit: none.   . Any recent hospitalizations or ED visits since last visit with CPP? No  . What diet changes have been made to improve Cholesterol?  o Patient states that she avoids bad fats and has been trying to incorporate good fatty fishes. Patient states that she tries to eat balanced meals and a good breakfast such as, eggs, yogurts with fresh fruit and rice. Patient states she tends to have a snack and a good dinner daily. Patient states that sometimes it is hard due to a reduction in appetite. Patient states that she has cut out sweets for the most part.  . What exercise is being done to improve Cholesterol?  o Patient states that she currently walks for 30-45 minutes daily. She also [facility].  o Patient states that she is concerned over her weight loss in the last year and loss of muscle mass. Patient stated that last year her weight was around 140 and now she stays between 112-115.  Adherence Review: Does the patient have >5 day gap between last estimated fill dates? No   Patient due for 6 month CCM Pharmacist follow up : Patient scheduled for 6 month follow up appointment with MaJeni Salleslinical Pharmacist 08/13/20 at 10:15am.     Star Rating Drugs:  Simvastatin 40103m last filled 05/11/20 90[facility] 05/11/20 [phone]

## 2020-08-11 ENCOUNTER — Other Ambulatory Visit: Payer: Self-pay

## 2020-08-11 DIAGNOSIS — M5416 Radiculopathy, lumbar region: Secondary | ICD-10-CM | POA: Diagnosis not present

## 2020-08-12 ENCOUNTER — Encounter: Payer: Self-pay | Admitting: Internal Medicine

## 2020-08-12 ENCOUNTER — Telehealth: Payer: Self-pay | Admitting: Pharmacist

## 2020-08-12 ENCOUNTER — Ambulatory Visit (INDEPENDENT_AMBULATORY_CARE_PROVIDER_SITE_OTHER): Payer: Medicare Other

## 2020-08-12 ENCOUNTER — Ambulatory Visit (INDEPENDENT_AMBULATORY_CARE_PROVIDER_SITE_OTHER): Payer: Medicare Other | Admitting: Internal Medicine

## 2020-08-12 VITALS — BP 120/74 | HR 57 | Temp 98.2°F | Ht 59.5 in | Wt 112.4 lb

## 2020-08-12 DIAGNOSIS — M25511 Pain in right shoulder: Secondary | ICD-10-CM

## 2020-08-12 DIAGNOSIS — E538 Deficiency of other specified B group vitamins: Secondary | ICD-10-CM

## 2020-08-12 DIAGNOSIS — E039 Hypothyroidism, unspecified: Secondary | ICD-10-CM

## 2020-08-12 DIAGNOSIS — R634 Abnormal weight loss: Secondary | ICD-10-CM

## 2020-08-12 DIAGNOSIS — Z79899 Other long term (current) drug therapy: Secondary | ICD-10-CM

## 2020-08-12 DIAGNOSIS — M25512 Pain in left shoulder: Secondary | ICD-10-CM | POA: Diagnosis not present

## 2020-08-12 LAB — HEPATIC FUNCTION PANEL
ALT: 16 U/L (ref 0–35)
AST: 19 U/L (ref 0–37)
Albumin: 4.6 g/dL (ref 3.5–5.2)
Alkaline Phosphatase: 58 U/L (ref 39–117)
Bilirubin, Direct: 0.1 mg/dL (ref 0.0–0.3)
Total Bilirubin: 0.6 mg/dL (ref 0.2–1.2)
Total Protein: 7 g/dL (ref 6.0–8.3)

## 2020-08-12 LAB — CBC WITH DIFFERENTIAL/PLATELET
Basophils Absolute: 0 10*3/uL (ref 0.0–0.1)
Basophils Relative: 0.7 % (ref 0.0–3.0)
Eosinophils Absolute: 0.2 10*3/uL (ref 0.0–0.7)
Eosinophils Relative: 4 % (ref 0.0–5.0)
HCT: 42.2 % (ref 36.0–46.0)
Hemoglobin: 14 g/dL (ref 12.0–15.0)
Lymphocytes Relative: 39.7 % (ref 12.0–46.0)
Lymphs Abs: 2.1 10*3/uL (ref 0.7–4.0)
MCHC: 33.1 g/dL (ref 30.0–36.0)
MCV: 89.1 fl (ref 78.0–100.0)
Monocytes Absolute: 0.3 10*3/uL (ref 0.1–1.0)
Monocytes Relative: 5.8 % (ref 3.0–12.0)
Neutro Abs: 2.7 10*3/uL (ref 1.4–7.7)
Neutrophils Relative %: 49.8 % (ref 43.0–77.0)
Platelets: 221 10*3/uL (ref 150.0–400.0)
RBC: 4.74 Mil/uL (ref 3.87–5.11)
RDW: 13.1 % (ref 11.5–15.5)
WBC: 5.4 10*3/uL (ref 4.0–10.5)

## 2020-08-12 LAB — VITAMIN B12: Vitamin B-12: 184 pg/mL — ABNORMAL LOW (ref 211–911)

## 2020-08-12 LAB — BASIC METABOLIC PANEL
BUN: 17 mg/dL (ref 6–23)
CO2: 30 mEq/L (ref 19–32)
Calcium: 10.1 mg/dL (ref 8.4–10.5)
Chloride: 103 mEq/L (ref 96–112)
Creatinine, Ser: 0.57 mg/dL (ref 0.40–1.20)
GFR: 90.31 mL/min (ref >60.00)
Glucose, Bld: 81 mg/dL (ref 70–99)
Potassium: 4.2 mEq/L (ref 3.5–5.1)
Sodium: 140 mEq/L (ref 135–145)

## 2020-08-12 LAB — TSH: TSH: 0.24 u[IU]/mL — ABNORMAL LOW (ref 0.35–4.50)

## 2020-08-12 LAB — C-REACTIVE PROTEIN: CRP: <1 mg/dL (ref 0.5–20.0)

## 2020-08-12 LAB — T4, FREE: Free T4: 1.16 ng/dL (ref 0.60–1.60)

## 2020-08-12 LAB — SEDIMENTATION RATE: Sed Rate: 12 mm/hr (ref 0–30)

## 2020-08-12 NOTE — Progress Notes (Signed)
Chronic Care Management Pharmacy Note  09/03/2020 Name:  Debbie Marshall MRN:  [id number] DOB:  [birth year]  Subjective: Debbie Marshall is an 75 y.o. year old female who is a primary patient of Panosh, Standley Brooking, MD.  The CCM team was consulted for assistance with disease management and care coordination needs.    Engaged with patient face to face for follow up visit in response to provider referral for pharmacy case management and/or care coordination services.   Consent to Services:  The patient was given information about Chronic Care Management services, agreed to services, and gave verbal consent prior to initiation of services.  Please see initial visit note for detailed documentation.   Patient Care Team: Burnis Medin, MD as PCP - General Earnie Larsson, [facility] as Pharmacist (Pharmacist)  Recent office visits: 08/12/20 Shanon Ace, MD: Patient presented for unintentional weight loss and shoulder pain. Ordered several labs. TSH and vitamin B12 were low. Referral placed to sports medicine.  06/10/20 Standley Brooking. Panosh MD (PCP) - seen for controlled type 2 diabetes, medication management, numbness of foot, back pain and hypothyroidism. No medication changes.   02/26/20 Standley Brooking. Panosh MD (PCP) - seen for annual visit. Discontinued amoxicillin- pot clavulanate 875-160m and prednisone 2106m Follow up depending on labs.   Recent consult visits: 08/12/20 MaKennon Roundsemergeortho): Patient presented for PT session.  07/22/20 [facility]line Lev[facility]D [facility] - seen for chronic low back pain, herniated disc and hypertension. No medication changes. No follow up noted.   05/20/20 ChRozetta NunneryD (Otolaryngology) - seen for aftercare following surgery. No medication changes. Patient instructed to follow up as needed.   05/06/20 ChRozetta NunneryD (Otolaryngology) - seen for aftercare following surgery. Recommend continue use of the saline irrigations on a daily basis and will follow  up in 2 weeks for recheck and cleaning. At that point consider treatment with nasal steroid spray as she has had history of polyps per note.   04/20/20 ChRozetta NunneryD (Otolaryngology) - seen for aftercare following surgery. Patient to use saline irrigation system twice daily and follow up for recheck in 1 week and cleaning.   03/25/20 ChRozetta NunneryD (Otolaryngology) - seen for nasal polyps and chronic sinusitis. Added a sterapred 1059m day dosepack and ordered a CT scan of sinus. Will schedule sinus surgery after holidays.   Hospital visits: None in previous 6 months  Objective:  Lab Results  Component Value Date   CREATININE 0.57 08/12/2020   BUN 17 08/12/2020   GFR 90.31 08/12/2020   GFRNONAA 87 02/26/2020   GFRAA 100 02/26/2020   NA 140 08/12/2020   K 4.2 08/12/2020   CALCIUM 10.1 08/12/2020   CO2 30 08/12/2020   GLUCOSE 81 08/12/2020    Lab Results  Component Value Date/Time   HGBA1C 5.9 (A) 06/10/2020 10:50 AM   HGBA1C 6.7 (H) 02/26/2020 12:54 PM   HGBA1C 6.4 09/06/2019 10:16 AM   GFR 90.31 08/12/2020 11:51 AM   GFR 85.00 09/06/2019 10:16 AM   MICROALBUR 1.2 09/06/2019 10:16 AM   MICROALBUR <0.7 06/21/2018 10:26 AM    Last diabetic Eye exam:  Lab Results  Component Value Date/Time   HMDIABEYEEXA No Retinopathy 07/29/2020 12:00 AM    Last diabetic Foot exam: No results found for: HMDIABFOOTEX   Lab Results  Component Value Date   CHOL 126 02/26/2020   HDL 47 (L) 02/26/2020   LDLCALC 59 02/26/2020   LDLDIRECT 163.6 10/23/2006   TRIG 113  02/26/2020   CHOLHDL 2.7 02/26/2020    Hepatic Function Latest Ref Rng & Units 08/12/2020 09/06/2019 01/25/2019  Total Protein 6.0 - 8.3 g/dL 7.0 7.1 6.6  Albumin 3.5 - 5.2 g/dL 4.6 4.6 4.5  AST 0 - 37 U/L 19 20 18   ALT 0 - 35 U/L 16 15 16   Alk Phosphatase 39 - 117 U/L 58 72 56  Total Bilirubin 0.2 - 1.2 mg/dL 0.6 0.6 0.8  Bilirubin, Direct 0.0 - 0.3 mg/dL 0.1 0.1 0.2    Lab Results  Component Value  Date/Time   TSH 0.24 (L) 08/12/2020 11:51 AM   TSH 0.81 02/26/2020 12:54 PM   FREET4 1.16 08/12/2020 11:51 AM   FREET4 0.84 09/06/2019 10:16 AM    CBC Latest Ref Rng & Units 08/12/2020 09/06/2019 01/25/2019  WBC 4.0 - 10.5 K/uL 5.4 6.9 5.3  Hemoglobin 12.0 - 15.0 g/dL 14.0 14.5 13.8  Hematocrit 36.0 - 46.0 % 42.2 43.4 41.9  Platelets 150.0 - 400.0 K/uL 221.0 221.0 200.0    Lab Results  Component Value Date/Time   VD25OH 24.76 (L) 05/06/2015 08:37 AM   VD25OH 27 (L) 10/14/2011 09:22 AM   VD25OH 24 (L) 02/12/2010 09:35 PM    Clinical ASCVD: No  The ASCVD Risk score (Goff DC Jr., et al., 2013) failed to calculate for the following reasons:   The valid total cholesterol range is 130 to 320 mg/dL    Depression screen [facility] 2/9 04/30/2020 02/26/2020 01/25/2019  Decreased Interest 0 0 0  Down, Depressed, Hopeless 0 0 0  PHQ - 2 Score 0 0 0  Altered sleeping 0 - -  Tired, decreased energy 0 - -  Change in appetite 0 - -  Feeling bad or failure about yourself  0 - -  Trouble concentrating 0 - -  Moving slowly or fidgety/restless 0 - -  Suicidal thoughts 0 - -  PHQ-9 Score 0 - -  Difficult doing work/chores Not difficult at all - -      Social History   Tobacco Use  Smoking Status Never Smoker  Smokeless Tobacco Never Used   BP Readings from Last 3 Encounters:  08/31/20 (!) 142/76  08/12/20 120/74  06/10/20 116/68   Pulse Readings from Last 3 Encounters:  08/31/20 (!) 57  08/12/20 (!) 57  06/10/20 64   Wt Readings from Last 3 Encounters:  08/31/20 116 lb 12.8 oz (53 kg)  08/12/20 112 lb 6.4 oz (51 kg)  06/29/20 115 lb (52.2 kg)   BMI Readings from Last 3 Encounters:  08/31/20 23.20 kg/m  08/12/20 22.32 kg/m  06/29/20 22.84 kg/m    Assessment/Interventions: Review of patient past medical history, allergies, medications, health status, including review of consultants reports, laboratory and other test data, was performed as part of comprehensive evaluation and  provision of chronic care management services.   SDOH:  (Social Determinants of Health) assessments and interventions performed: No  SDOH Screenings   Alcohol Screen: Low Risk   . Last Alcohol Screening Score (AUDIT): 1  Depression (PHQ2-9): Low Risk   . PHQ-2 Score: 0  Financial Resource Strain: Low Risk   . Difficulty of Paying Living Expenses: Not hard at all  Food Insecurity: No Food Insecurity  . Worried About Charity fundraiser in the Last Year: Never true  . Ran Out of Food in the Last Year: Never true  Housing: Low Risk   . Last Housing Risk Score: 0  Physical Activity: Sufficiently Active  . Days of  Exercise per Week: 4 days  . Minutes of Exercise per Session: 40 min  Social Connections: Moderately Isolated  . Frequency of Communication with Friends and Family: More than three times a week  . Frequency of Social Gatherings with Friends and Family: More than three times a week  . Attends Religious Services: More than 4 times per year  . Active Member of Clubs or Organizations: No  . Attends Archivist Meetings: Never  . Marital Status: Never married  Stress: No Stress Concern Present  . Feeling of Stress : Not at all  Tobacco Use: Low Risk   . Smoking Tobacco Use: Never Smoker  . Smokeless Tobacco Use: Never Used  Transportation Needs: No Transportation Needs  . Lack of Transportation (Medical): No  . Lack of Transportation (Non-Medical): No    CCM Care Plan  No Known Allergies  Medications Reviewed Today    Reviewed by Mare Ferrari (Technician) on 08/31/20 at 1443  Med List Status: <None>  Medication Order Taking? Sig Documenting Provider Last Dose Status Informant  ACCU-CHEK GUIDE test strip [id number] No TEST TWICE DAILY Panosh, Standley Brooking, MD Taking Active   Accu-Chek Softclix Lancets lancets [id number] No TEST TWICE DAILY AS  DIRECTED Panosh, Standley Brooking, MD Taking Active   Ascorbic Acid (VITAMIN C) 500 MG CAPS [id number] No Take 1 capsule by mouth  daily. [provider] Taking Active   Blood Glucose Monitoring Suppl (ACCU-CHEK GUIDE) w/Device KIT [id number] No USE TO CHECK BLOOD SUGAR  TWICE DAILY Panosh, Standley Brooking, MD Taking Active   Cholecalciferol 1000 UNITS capsule [id number] No Take 1 capsule (1,000 Units total) by mouth daily. Panosh, Standley Brooking, MD Taking Active   fluticasone [facility]) 50 MCG/ACT nasal spray [id number] No Place 2 sprays into both nostrils daily. [place], Betty G, MD Taking Active   Lancet Devices [facility]) lancets [id number] No Test twice daily. Panosh, Standley Brooking, MD Taking Active   levothyroxine (SYNTHROID) 50 MCG tablet [id number]  Take 1 tablet (50 mcg total) by mouth daily. Panosh, Standley Brooking, MD  Active   LUTEIN PO [id number] No Take 40 mg by mouth daily. [provider] Taking Active   metFORMIN (GLUCOPHAGE-XR) 500 MG 24 hr tablet [id number] No TAKE 2 TABLETS BY MOUTH  DAILY Panosh, Standley Brooking, MD Taking Active   MULTIPLE VITAMIN PO [id number] No Take 1 capsule by mouth daily. [provider] Taking Active   Omega-3 Fatty Acids (FISH OIL) 435 MG CAPS [id number] No Take 1 capsule by mouth in the morning and at bedtime. [provider] Taking Active   simvastatin (ZOCOR) 40 MG tablet [id number] No TAKE 1 TABLET BY MOUTH AT  BEDTIME Panosh, Standley Brooking, MD Taking Active           Patient Active Problem List   Diagnosis Date Noted  . Hip crepitus 02/27/2014  . Visit for preventive health examination 10/28/2013  . Nocturia 10/28/2013  . Osteopenia 11/15/2012  . AC (acromioclavicular) arthritis 11/10/2011  . Cubital tunnel syndrome on right 10/14/2011  . Arm pain, right 10/14/2011  . Arthritis 10/05/2010  . Physical exam, annual 06/02/2010  . Diabetes mellitus type 2, uncomplicated ([place]) 71/69/6789  . Positive PPD 06/02/2010  . VITAMIN D DEFICIENCY 08/14/2009  . ABNORMAL EXAM-BILIARY TRACT 11/14/2008  . PAIN IN JOINT, HAND 07/22/2008  . Hypothyroidism 10/30/2006  . Hyperlipidemia  09/07/2006  . GERD 09/07/2006  . OSTEOARTHRITIS 09/07/2006  . FASTING HYPERGLYCEMIA 09/07/2006    Immunization History  Administered Date(s) Administered  .  Fluad Quad(high Dose 65+) 01/25/2019, 02/26/2020  . Influenza Split 04/24/2012  . Influenza Whole 02/19/2010  . Influenza, High Dose Seasonal PF 04/02/2015, 05/27/2016, 12/06/2016, 12/21/2017  . Influenza,inj,Quad PF,6+ Mos 04/18/2013, 02/27/2014  . Influenza,inj,quad, With Preservative 01/07/2019  . PFIZER(Purple Top)SARS-COV-2 Vaccination 06/20/2019, 07/10/2019, 03/11/2020  . PPD Test 07/02/2018  . Pneumococcal Conjugate-13 11/03/2014  . Pneumococcal Polysaccharide-23 10/24/2012  . Tdap 06/12/2013  . Zoster, Live 06/12/2013    Conditions to be addressed/monitored:  Hyperlipidemia, Diabetes, Hypothyroidism, Osteopenia and Vitamin D deficiency  Care Plan : CCM Pharmacy Care Plan  Updates made by Viona Gilmore, [place] since 09/03/2020 12:00 AM    Problem: Problem: Hyperlipidemia, Diabetes, Hypothyroidism, Osteopenia and Vitamin D deficiency     Long-Range Goal: Patient-Specific Goal   Start Date: 08/13/2020  Expected End Date: 08/13/2021  This Visit's Progress: On track  Priority: High  Note:   Current Barriers:  . Unable to independently monitor therapeutic efficacy  Pharmacist Clinical Goal(s):  Marland Kitchen Patient will achieve adherence to monitoring guidelines and medication adherence to achieve therapeutic efficacy through collaboration with PharmD and provider.   Interventions: . 1:1 collaboration with Panosh, Standley Brooking, MD regarding development and update of comprehensive plan of care as evidenced by provider attestation and co-signature . Inter-disciplinary care team collaboration (see longitudinal plan of care) . Comprehensive medication review performed; medication list updated in electronic medical record  Hyperlipidemia: (LDL goal < 70) -Controlled -Current treatment:  Simvastatin 84m, 1 tablet at bedtime    Omega-3 fatty acids 10073m  1 capsule in the morning and at bedtime -Medications previously tried: none  -Current dietary patterns: cut down on carbs -Current exercise habits: did not discuss -Educated on Cholesterol goals;  Benefits of statin for ASCVD risk reduction; Importance of limiting foods high in cholesterol; Exercise goal of 150 minutes per week; -Counseled on diet and exercise extensively Recommended to continue current medication  Diabetes (A1c goal <7%) -Controlled -Current medications: . Marland Kitchenetformin ER 50057m1 tablet twice daily  -Medications previously tried: none  -Current home glucose readings . fasting glucose: 93-110 . post prandial glucose: sometimes > 200 after a meal -Denies hypoglycemic/hyperglycemic symptoms -Current meal patterns:  . breakfast: did not discuss  . lunch: did not discuss  . dinner: did not discuss . snacks: did not discuss . drinks: did not discuss -Current exercise: did not discuss -Educated on A1c and blood sugar goals; Exercise goal of 150 minutes per week; Benefits of routine self-monitoring of blood sugar; -Counseled to check feet daily and get yearly eye exams -Counseled on diet and exercise extensively Recommended to continue current medication  Osteopenia (Goal prevent fractures) -Uncontrolled -Last DEXA Scan: 11/01/2012   T-Score femoral neck: LFN -1.7  T-Score total hip: n/a  T-Score lumbar spine: n/a  T-Score forearm radius: n/a  10-year probability of major osteoporotic fracture: n/a  10-year probability of hip fracture: n/a -Patient is not a candidate for pharmacologic treatment -Current treatment  . Vitamin D3 (cholecalciferol) 1000 units, 1 capsule once daily -Medications previously tried: none  -Recommend [phone] units of vitamin D daily. Recommend 1200 mg of calcium daily from dietary and supplemental sources. Recommend weight-bearing and muscle strengthening exercises for building and maintaining bone  density. -Recommended repeat DEXA. Patient scheduled already.   Vitamin D deficiency (Goal: 30-100) -Uncontrolled -Current treatment  . Vitamin D3 (cholecalciferol) 1000 units, 1 capsule once daily -Medications previously tried: none  -Recommended to continue current medication Recommended repeat vitamin D level  Vitamin B12 deficiency (Goal: 211-911) -Uncontrolled -Current treatment  .  No medications -Medications previously tried: none  -Recommended to start 1000 mcg SL daily as recommended by PCP   Hypothyroidism (Goal: TSH 0.35-4.5) -Controlled -Current treatment  . Levothyroxine 50 mcg 1 tablet daily -Medications previously tried: none  -Recommended to continue current medication  Health Maintenance -Vaccine gaps: shingrix -Current therapy:   Vitamin C 553m, 1 capsule once daily  Multivitamin, 1 capsule once daily  Fluticasone (Flonase) nasal spray, 2 sprays into both nostrils once daily (currently not using)  -Educated on Cost vs benefit of each product must be carefully weighed by individual consumer -Patient is satisfied with current therapy and denies issues -Recommended to continue current medication  Patient Goals/Self-Care Activities . Patient will:  - take medications as prescribed check glucose daily, document, and provide at future appointments  Follow Up Plan: Face to Face appointment with care management team member scheduled for:  6 months       Medication Assistance: None required.  Patient affirms current coverage meets needs.  [facility]:  [facility] [address] [place] [address]Phone: [phone]: [address] [address] [address] [place]Phone: [phone]: [phone] Uses pill box? Yes Pt endorses 100% compliance  We discussed:  Current pharmacy is preferred with insurance plan and patient is satisfied with pharmacy services Patient decided to: Continue current medication management strategy  Care Plan and Follow Up Patient Decision:  Patient agrees to Care Plan and Follow-up.  Plan: Face to Face appointment with care management team member scheduled for: 6 months  MJeni Salles PharmD BMalad CityPharmacist LDushoreat [phone]

## 2020-08-12 NOTE — Progress Notes (Signed)
Chest x-ray normal no acute findings reassuring.

## 2020-08-12 NOTE — Progress Notes (Signed)
Chief Complaint  Patient presents with  . Acute Visit    C/o having 5 lb weight loss since last OV and also having bilateral shoulder pain off/on for months with Rt shoulder hurting more.   No OTC meds taken.    HPI: Debbie [facility] 75 y.o. come in for because of concern of unintentional weight loss.  Going down and   Was  110 in am and not trying  At this time  But no weakness and actually feels quite well now Had significant nausea when on antibiotic Augmentin for sinusitis and did have her nasal surgery where her taste and smell was down and may have had decreased appetite In the meantime she has limited rice products to help with blood sugar control and diabetes.  But has not been trying to lose weight. No nocturnal sweats illness symptoms diarrhea vomiting.  She had been down to 2 meals a day during some of these above but now back up to 3 meals a day.  She thinks the antibiotic was in February or March.  The med list states that it was early April maybe per Dr. Lucia Gaskins. In addition she has had some months of shoulder discomfort pain right more than left twisting causes the pain is right-handed sometimes thumb is aching question radiating no specific injury she is known to have lumbar radiculopathy and some feet symptoms that are bit better has seen Dr. Vertell Limber in the past for this.  Denies any chest pain shortness of breath fevers cough   Is to have colonoscopy in June.  Had a friend or acquaintance who was diagnosed with liver cancer from lab work. She knows of no personal history for hepatitis B or C. ROS: See pertinent positives and negatives per HPI.  Past Medical History:  Diagnosis Date  . ABNORMAL EXAM-BILIARY TRACT 11/14/2008  . ABNORMAL TRANSAMINASE, (LFT'S) 11/14/2008    2005 US shows fatty liver  . Allergy   . Cancer ([place]) 1996   tongue cancer  . Diabetes mellitus without complication ([place])   . FASTING HYPERGLYCEMIA 09/07/2006  . GERD 09/07/2006  . HYPERLIPIDEMIA 09/07/2006   . HYPOTHYROIDISM 10/30/2006  . Nonspecific abnormal results of liver function study 09/07/2006  . Normal nuclear stress test 2005   stress cardiolite   . OSTEOARTHRITIS 09/07/2006  . Positive PPD    received vaccine in home country- shows positive after receiving  . 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 VITAMIN D DEFICIENCY 08/14/2009    Family History  Problem Relation Age of Onset  . Stroke Mother 34  . Arthritis Mother   . Rheum arthritis Brother   . Arthritis Brother   . Gout Brother   . Colon cancer Other   . Esophageal cancer Neg Hx   . Stomach cancer Neg Hx   . Rectal cancer Neg Hx   . Colon polyps Neg Hx     Social History   Socioeconomic History  . Marital status: Single    Spouse name: Not on file  . Number of children: Not on file  . Years of education: Not on file  . Highest education level: Not on file  Occupational History  . Occupation: PhD Professor  Tobacco Use  . Smoking status: Never Smoker  . Smokeless tobacco: Never Used  Vaping Use  . Vaping Use: Never used  Substance and Sexual Activity  . Alcohol use: Yes    Alcohol/week: 1.0 standard drink  Types: 1 Glasses of wine per week    Comment: rarely  . Drug use: No  . Sexual activity: Not on file  Other Topics Concern  . Not on file  Social History Narrative   HH of 2 Father and her    cat     PhD professor in information systems    No tobacco or alcohol exercising regularly currently   Social Determinants of Health   Financial Resource Strain: Low Risk   . Difficulty of Paying Living Expenses: Not hard at all  Food Insecurity: No Food Insecurity  . Worried About Charity fundraiser in the Last Year: Never true  . Ran Out of Food in the Last Year: Never true  Transportation Needs: No Transportation Needs  . Lack of Transportation (Medical): No  . Lack of Transportation (Non-Medical): No  Physical Activity: Sufficiently Active  . Days of Exercise per  Week: 4 days  . Minutes of Exercise per Session: 40 min  Stress: No Stress Concern Present  . Feeling of Stress : Not at all  Social Connections: Moderately Isolated  . Frequency of Communication with Friends and Family: More than three times a week  . Frequency of Social Gatherings with Friends and Family: More than three times a week  . Attends Religious Services: More than 4 times per year  . Active Member of Clubs or Organizations: No  . Attends Archivist Meetings: Never  . Marital Status: Never married    Outpatient Medications Prior to Visit  Medication Sig Dispense Refill  . ACCU-CHEK GUIDE test strip TEST TWICE DAILY 200 strip 3  . Accu-Chek Softclix Lancets lancets TEST TWICE DAILY AS  DIRECTED 200 each 3  . Ascorbic Acid (VITAMIN C) 500 MG CAPS Take 1 capsule by mouth daily.    . Blood Glucose Monitoring Suppl (ACCU-CHEK GUIDE) w/Device KIT USE TO CHECK BLOOD SUGAR  TWICE DAILY 1 kit 1  . Cholecalciferol 1000 UNITS capsule Take 1 capsule (1,000 Units total) by mouth daily. 90 capsule 3  . fluticasone (FLONASE) 50 MCG/ACT nasal spray Place 2 sprays into both nostrils daily. 16 g 2  . Lancet Devices (ACCU-CHEK SOFTCLIX) lancets Test twice daily. 1 each 12  . levothyroxine (SYNTHROID) 75 MCG tablet TAKE 1 TABLET BY MOUTH 6  DAYS A WEEK MONDAY THROUGH  SATURDAY, DO NOT TAKE ON  SUNDAY 78 tablet 3  . LUTEIN PO Take 40 mg by mouth daily.    . metFORMIN (GLUCOPHAGE-XR) 500 MG 24 hr tablet TAKE 2 TABLETS BY MOUTH  DAILY 180 tablet 1  . MULTIPLE VITAMIN PO Take 1 capsule by mouth daily.    . Omega-3 Fatty Acids (FISH OIL) 435 MG CAPS Take 1 capsule by mouth in the morning and at bedtime.    . simvastatin (ZOCOR) 40 MG tablet TAKE 1 TABLET BY MOUTH AT  BEDTIME 90 tablet 3  . amoxicillin-clavulanate (AUGMENTIN) 875-125 MG tablet Take 1 tablet by mouth 2 (two) times daily. (Patient not taking: Reported on 08/12/2020) 20 tablet 0   No facility-administered medications prior to  visit.     EXAM:  BP 120/74   Pulse (!) 57   Temp 98.2 F (36.8 C) (Oral)   Ht 4' 11.5" (1.511 m)   Wt 112 lb 6.4 oz (51 kg)   SpO2 98%   BMI 22.32 kg/m   Body mass index is 22.32 kg/m. Wt Readings from Last 3 Encounters:  08/12/20 112 lb 6.4 oz (51 kg)  06/29/20  115 lb (52.2 kg)  06/10/20 117 lb 9.6 oz (53.3 kg)    GENERAL: vitals reviewed and listed above, alert, oriented, appears well hydrated and in no acute distress HEENT: atraumatic, conjunctiva  clear, no obvious abnormalities on inspection of external nose and ears OP : masked  NECK: no obvious masses on inspection palpation  No significant lymphadenopathy in neck supraclavicular axillary or inguinal. LUNGS: clear to auscultation bilaterally, no wheezes, rales or rhonchi, good air movement CV: HRRR, no clubbing cyanosis or  peripheral edema nl cap refill  Abdomen soft without again a megaly guarding or rebound. MS: moves all extremities without noticeable focal  abnormality PSYCH: pleasant and cooperative, no obvious depression or anxiety Lab Results  Component Value Date   WBC 6.9 09/06/2019   HGB 14.5 09/06/2019   HCT 43.4 09/06/2019   PLT 221.0 09/06/2019   GLUCOSE 92 02/26/2020   CHOL 126 02/26/2020   TRIG 113 02/26/2020   HDL 47 (L) 02/26/2020   LDLDIRECT 163.6 10/23/2006   LDLCALC 59 02/26/2020   ALT 15 09/06/2019   AST 20 09/06/2019   NA 143 02/26/2020   K 5.2 02/26/2020   CL 104 02/26/2020   CREATININE 0.70 02/26/2020   BUN 19 02/26/2020   CO2 29 02/26/2020   TSH 0.81 02/26/2020   INR 1.0 ratio 11/14/2008   HGBA1C 5.9 (A) 06/10/2020   MICROALBUR 1.2 09/06/2019   BP Readings from Last 3 Encounters:  08/12/20 120/74  06/10/20 116/68  02/27/20 130/70   Wt Readings from Last 3 Encounters:  08/12/20 112 lb 6.4 oz (51 kg)  06/29/20 115 lb (52.2 kg)  06/10/20 117 lb 9.6 oz (53.3 kg)  ate fruit and yogurt this a.m. Lab Results  Component Value Date   VITAMINB12 281 08/07/2009     ASSESSMENT AND PLAN:  Discussed the following assessment and plan:  Weight loss - Plan: Basic metabolic panel, CBC with Differential/Platelet, Hepatic function panel, TSH, T4, free, DG Chest 2 View, Sedimentation rate, C-reactive protein, Vitamin B12, Vitamin B12, C-reactive protein, Sedimentation rate, T4, free, TSH, Hepatic function panel, CBC with Differential/Platelet, Basic metabolic panel  Bilateral shoulder pain, unspecified chronicity - Plan: Basic metabolic panel, CBC with Differential/Platelet, Hepatic function panel, TSH, T4, free, DG Chest 2 View, Sedimentation rate, C-reactive protein, Ambulatory referral to Sports Medicine, C-reactive protein, Sedimentation rate, T4, free, TSH, Hepatic function panel, CBC with Differential/Platelet, Basic metabolic panel  Medication management - Plan: Basic metabolic panel, CBC with Differential/Platelet, Hepatic function panel, TSH, T4, free, DG Chest 2 View, Sedimentation rate, C-reactive protein, Vitamin B12, Vitamin B12, C-reactive protein, Sedimentation rate, T4, free, TSH, Hepatic function panel, CBC with Differential/Platelet, Basic metabolic panel  Hypothyroidism, unspecified type - Plan: Basic metabolic panel, CBC with Differential/Platelet, Hepatic function panel, TSH, T4, free, DG Chest 2 View, Sedimentation rate, C-reactive protein, C-reactive protein, Sedimentation rate, T4, free, TSH, Hepatic function panel, CBC with Differential/Platelet, Basic metabolic panel  Low serum vitamin B12 - Plan: Vitamin B12, Vitamin B12 Weight loss was basically unintentional but she did have conditions that would have decreased her appetite albeit uncertain if enough to cause this weight loss. Will check thyroid levels metabolic update. She notes that it is positive that she can now smell and taste things she could not before. Plan lab work chest x-ray today if all is well we will follow-up closely 1 to 2 months or as indicated It appears her shoulder  predicament may be mechanical and yes could be age triggered.  Referred to sports medicine  after discussion for evaluation and intervention exercises as appropriate. -Patient advised to return or notify health care team  if  new concerns arise.  Patient Instructions  Exam is good today .   Lab monitoring today  To consider  Other causes of weight loss.  Chest x ray .  Today  Suspect the   Shoulder is a mechanical problem .     Will follow up depending    1-2 months     Mariska Daffin K. Keerat Denicola M.D.

## 2020-08-12 NOTE — Progress Notes (Signed)
    Chronic Care Management Pharmacy Assistant   Name: Debbie [facility]  MRN: [id number] DOB: 1946/04/05  Left voicemail reminding patient of in person office visit on 08/13/20 10:15am with Jeni Salles clinical pharmacist. Advised patient to bring all medications and any blood pressure or blood glucose reading to appointment.   [place] [phone]

## 2020-08-12 NOTE — Patient Instructions (Addendum)
Exam is good today .   Lab monitoring today  To consider  Other causes of weight loss.  Chest x ray .  Today  Suspect the   Shoulder is a mechanical problem .     Will follow up depending    1-2 months

## 2020-08-12 NOTE — Progress Notes (Signed)
So your thyroid is a little bit over suppressed could contribute to weight loss but would not expect it to be dramatic because affect   Also your B12 level is quite low could be from malabsorption of B vitamins with the metformin.  That usually does not explain the weight loss but we need to correct this.  Your inflammation markers are normal as well as blood count these are all reassuring.  Chest x-ray was normal. I advise begin B12 supplementation sublingual dissolvable over-the-counter at 1000 to 2500 mcg/day  decrease the levothyroxine to 50 mcg/day.  Dispense 90 refill x1   check TSH and free T4 vitamin B12 level in 2 to 3 months.

## 2020-08-13 ENCOUNTER — Ambulatory Visit (INDEPENDENT_AMBULATORY_CARE_PROVIDER_SITE_OTHER): Payer: Medicare Other | Admitting: Pharmacist

## 2020-08-13 ENCOUNTER — Other Ambulatory Visit: Payer: Self-pay

## 2020-08-13 DIAGNOSIS — E118 Type 2 diabetes mellitus with unspecified complications: Secondary | ICD-10-CM | POA: Diagnosis not present

## 2020-08-13 DIAGNOSIS — E039 Hypothyroidism, unspecified: Secondary | ICD-10-CM | POA: Diagnosis not present

## 2020-08-15 NOTE — Telephone Encounter (Signed)
See result note please send in  New dose to her pharmacy and  Let her know  Results  Please place future orders   So your thyroid is a little bit over suppressed could contribute to weight loss but would not expect it to be dramatic because affect  Also your B12 level is quite low could be from malabsorption of B vitamins with the metformin. That usually does not explain the weight loss but we need to correct this. Your inflammation markers are normal as well as blood count these are all reassuring. Chest x-ray was normal. I advise begin B12 supplementation sublingual dissolvable over-the-counter at 1000 to 2500 mcg/day  decrease the levothyroxine to 50 mcg/day. Dispense 90 refill x1   check TSH and free T4 vitamin B12 level in 2 to 3 months.

## 2020-08-17 ENCOUNTER — Other Ambulatory Visit: Payer: Self-pay

## 2020-08-17 DIAGNOSIS — E039 Hypothyroidism, unspecified: Secondary | ICD-10-CM

## 2020-08-17 DIAGNOSIS — E538 Deficiency of other specified B group vitamins: Secondary | ICD-10-CM

## 2020-08-17 MED ORDER — LEVOTHYROXINE SODIUM 50 MCG PO TABS: 50.0000 ug | ORAL_TABLET | Freq: Every day | ORAL | 1 refills | Status: DC

## 2020-08-25 ENCOUNTER — Encounter: Payer: Self-pay | Admitting: Family Medicine

## 2020-08-28 NOTE — Progress Notes (Signed)
I, Peterson Lombard, LAT, ATC acting as a scribe for Lynne Leader, MD.  Subjective:    I'm seeing this patient as a consultation for: Dr. Shanon Ace. Note will be routed back to referring provider/PCP.  CC: Bilateral shoulder pain  HPI: Pt is a 75 y/o female c/o bilat shoulder pain, R>L. Pt is R-hand dominate. Pt has been previously seen by Dr. Vertell Limber for lumbar radiculopathy. Pt reports bilat shoulder pain has been ongoing for about a month. Pt reports pain is intermittent. Pt locates pain to the posterior aspect of [place] joint. No numbness/tingling.  Neck pain: no Radiates: yes- into elbow Aggravates: IR,side-lying, at night Treatments tried: patch, naproxen  Additionally, pt c/o R thumb pain x 1 month. Pt locates pain to MCP joint that is intermittent esp when she presses on distal phalanx.  Grip strength: no Thumb swelling: no Aggravates: pressing on tip of thumb,  Treatments tried: rest  Dx imaging: 10/12/10 R & L hand XR  Past medical history, Surgical history, Family history, Social history, Allergies, and medications have been entered into the medical record, reviewed.   Review of Systems: No new headache, visual changes, nausea, vomiting, diarrhea, constipation, dizziness, abdominal pain, skin rash, fevers, chills, night sweats, weight loss, swollen lymph nodes, body aches, joint swelling, muscle aches, chest pain, shortness of breath, mood changes, visual or auditory hallucinations.   Objective:    Vitals:   08/31/20 1438  BP: (!) 142/76  Pulse: (!) 57  SpO2: 98%   General: Well Developed, well nourished, and in no acute distress.  Neuro/Psych: Alert and oriented x3, extra-ocular muscles intact, able to move all 4 extremities, sensation grossly intact. Skin: Warm and dry, no rashes noted.  Respiratory: Not using accessory muscles, speaking in full sentences, trachea midline.  Cardiovascular: Pulses palpable, no extremity edema. Abdomen: Does not appear distended. MSK:  Right shoulder normal-appearing Range of motion normal abduction and external rotation.  Internal rotation limited to posterior iliac crest. Strength is intact. Minimally positive Hawkins and Neer's test. Negative empty can test. Negative Yergason's and speeds test.  Left shoulder normal-appearing nontender normal motion normal strength negative impingement testing.  Right hand normal-appearing nontender normal thumb motion.  Lab and Radiology Results  X-ray images right shoulder obtained today personally and independently interpreted Decreased bone mineral density.  No acute fractures.  No severe DJD Await formal radiology review  Diagnostic Limited MSK Ultrasound of: Right shoulder Biceps tendon is intact in bicipital groove.  Small amount of hypoechoic fluid tracks within biceps tendon sheath. Subscapularis tendon is intact and normal appearing Supraspinatus tendon is intact. Mild subacromial bursitis is present. Infraspinatus tendon is intact. AC joint degenerative appearing Impression: Subacromial bursitis   Impression and Recommendations:    Assessment and Plan: 75 y.o. female with right shoulder pain.  Patient does have a tiny amount of contralateral left shoulder pain however the right shoulder is certainly a dominant issue today.  Pain predominantly due to bursitis on ultrasound however on physical exam and internal rotation is the main issue.  Plan to treat conservatively with physical therapy.  She already has established PT going for her back at [facility] so we will use that location..    Hand pain thought to be mostly due to a little bit of degenerative changes.  Plan to treat with Voltaren gel and PT.  Recheck 6 weeks.  PDMP not reviewed this encounter. Orders Placed This Encounter  Procedures  . [place] LIMITED JOINT SPACE STRUCTURES UP BILAT(NO LINKED CHARGES)  Standing Status:   Future    Number of Occurrences:   1    Standing Expiration Date:    03/03/2021    Order Specific Question:   Reason for Exam (SYMPTOM  OR DIAGNOSIS REQUIRED)    Answer:   bilateral shoulder pain    Order Specific Question:   Preferred imaging location?    Answer:   [facility]  . DG Shoulder Right    Standing Status:   Future    Number of Occurrences:   1    Standing Expiration Date:   08/31/2021    Order Specific Question:   Reason for Exam (SYMPTOM  OR DIAGNOSIS REQUIRED)    Answer:   eval rt shoulder    Order Specific Question:   Preferred imaging location?    Answer:   [facility]  . Ambulatory referral to Physical Therapy    Referral Priority:   Routine    Referral Type:   Physical Medicine    Referral Reason:   Specialty Services Required    Requested Specialty:   Physical Therapy    Number of Visits Requested:   1   No orders of the defined types were placed in this encounter.   Discussed warning signs or symptoms. Please see discharge instructions. Patient expresses understanding.   The above documentation has been reviewed and is accurate and complete Lynne Leader, M.D.

## 2020-08-31 ENCOUNTER — Other Ambulatory Visit: Payer: Self-pay

## 2020-08-31 ENCOUNTER — Ambulatory Visit (INDEPENDENT_AMBULATORY_CARE_PROVIDER_SITE_OTHER): Payer: Medicare Other

## 2020-08-31 ENCOUNTER — Ambulatory Visit: Payer: Self-pay

## 2020-08-31 ENCOUNTER — Ambulatory Visit: Payer: Medicare Other | Admitting: Family Medicine

## 2020-08-31 VITALS — BP 142/76 | HR 57 | Ht 59.5 in | Wt 116.8 lb

## 2020-08-31 DIAGNOSIS — G8929 Other chronic pain: Secondary | ICD-10-CM | POA: Diagnosis not present

## 2020-08-31 DIAGNOSIS — M25512 Pain in left shoulder: Secondary | ICD-10-CM | POA: Diagnosis not present

## 2020-08-31 DIAGNOSIS — M25511 Pain in right shoulder: Secondary | ICD-10-CM

## 2020-08-31 DIAGNOSIS — M79644 Pain in right finger(s): Secondary | ICD-10-CM | POA: Diagnosis not present

## 2020-08-31 NOTE — Patient Instructions (Addendum)
Thank you for coming in today.  I've referred you to Physical Therapy.  Let us know if you don't hear from them in one week.  Please get an Xray today before you leave  Please use Voltaren gel (Generic Diclofenac Gel) up to 4x daily for pain as needed.  This is available over-the-counter as both the name brand Voltaren gel and the generic diclofenac gel.  Recheck in 6 weeks.   Continue exercise however limit the exercise if it causes a lot of pain.

## 2020-09-02 NOTE — Progress Notes (Signed)
Right shoulder x-ray looks normal to radiology

## 2020-09-03 NOTE — Patient Instructions (Signed)
Hi Valynn,  It was great to get to meet you in person! Below is a summary of some of the topics we discussed.   Please reach out to me if you have any questions or need anything before our follow up!  Best, Maddie  Jeni Salles, PharmD, [facility]  Visit Information  Goals Addressed   None    Patient Care Plan: CCM Pharmacy Care Plan    Problem Identified: Problem: Hyperlipidemia, Diabetes, Hypothyroidism, Osteopenia and Vitamin D deficiency     Long-Range Goal: Patient-Specific Goal   Start Date: 08/13/2020  Expected End Date: 08/13/2021  This Visit's Progress: On track  Priority: High  Note:   Current Barriers:  . Unable to independently monitor therapeutic efficacy  Pharmacist Clinical Goal(s):  Marland Kitchen Patient will achieve adherence to monitoring guidelines and medication adherence to achieve therapeutic efficacy through collaboration with PharmD and provider.   Interventions: . 1:1 collaboration with Panosh, Standley Brooking, MD regarding development and update of comprehensive plan of care as evidenced by provider attestation and co-signature . Inter-disciplinary care team collaboration (see longitudinal plan of care) . Comprehensive medication review performed; medication list updated in electronic medical record  Hyperlipidemia: (LDL goal < 70) -Controlled -Current treatment:  Simvastatin 40mg , 1 tablet at bedtime   Omega-3 fatty acids 1000mg ,  1 capsule in the morning and at bedtime -Medications previously tried: none  -Current dietary patterns: cut down on carbs -Current exercise habits: did not discuss -Educated on Cholesterol goals;  Benefits of statin for ASCVD risk reduction; Importance of limiting foods high in cholesterol; Exercise goal of 150 minutes per week; -Counseled on diet and exercise extensively Recommended to continue current medication  Diabetes (A1c goal <7%) -Controlled -Current  medications: Marland Kitchen Metformin ER 500mg , 1 tablet twice daily  -Medications previously tried: none  -Current home glucose readings . fasting glucose: 93-110 . post prandial glucose: sometimes > 200 after a meal -Denies hypoglycemic/hyperglycemic symptoms -Current meal patterns:  . breakfast: did not discuss  . lunch: did not discuss  . dinner: did not discuss . snacks: did not discuss . drinks: did not discuss -Current exercise: did not discuss -Educated on A1c and blood sugar goals; Exercise goal of 150 minutes per week; Benefits of routine self-monitoring of blood sugar; -Counseled to check feet daily and get yearly eye exams -Counseled on diet and exercise extensively Recommended to continue current medication  Osteopenia (Goal prevent fractures) -Uncontrolled -Last DEXA Scan: 11/01/2012   T-Score femoral neck: LFN -1.7  T-Score total hip: n/a  T-Score lumbar spine: n/a  T-Score forearm radius: n/a  10-year probability of major osteoporotic fracture: n/a  10-year probability of hip fracture: n/a -Patient is not a candidate for pharmacologic treatment -Current treatment  . Vitamin D3 (cholecalciferol) 1000 units, 1 capsule once daily -Medications previously tried: none  -Recommend [phone] units of vitamin D daily. Recommend 1200 mg of calcium daily from dietary and supplemental sources. Recommend weight-bearing and muscle strengthening exercises for building and maintaining bone density. -Recommended repeat DEXA. Patient scheduled already.   Vitamin D deficiency (Goal: 30-100) -Uncontrolled -Current treatment  . Vitamin D3 (cholecalciferol) 1000 units, 1 capsule once daily -Medications previously tried: none  -Recommended to continue current medication Recommended repeat vitamin D level  Vitamin B12 deficiency (Goal: 211-911) -Uncontrolled -Current treatment  . No medications -Medications previously tried: none  -Recommended to start 1000 mcg SL daily as recommended by  PCP   Hypothyroidism (Goal: TSH 0.35-4.5) -Controlled -Current treatment  .  Levothyroxine 50 mcg 1 tablet daily -Medications previously tried: none  -Recommended to continue current medication  Health Maintenance -Vaccine gaps: shingrix -Current therapy:   Vitamin C 500mg , 1 capsule once daily  Multivitamin, 1 capsule once daily  Fluticasone (Flonase) nasal spray, 2 sprays into both nostrils once daily (currently not using)  -Educated on Cost vs benefit of each product must be carefully weighed by individual consumer -Patient is satisfied with current therapy and denies issues -Recommended to continue current medication  Patient Goals/Self-Care Activities . Patient will:  - take medications as prescribed check glucose daily, document, and provide at future appointments  Follow Up Plan: Face to Face appointment with care management team member scheduled for:  6 months       Patient verbalizes understanding of instructions provided today and agrees to view in [place].  Face to Face appointment with pharmacist scheduled for:  6 months  Viona Gilmore, [facility]

## 2020-09-08 ENCOUNTER — Ambulatory Visit (AMBULATORY_SURGERY_CENTER): Payer: Medicare Other | Admitting: *Deleted

## 2020-09-08 ENCOUNTER — Other Ambulatory Visit: Payer: Self-pay

## 2020-09-08 VITALS — Ht 59.5 in | Wt 116.0 lb

## 2020-09-08 DIAGNOSIS — Z86010 Personal history of colonic polyps: Secondary | ICD-10-CM

## 2020-09-08 NOTE — Progress Notes (Addendum)
No egg or soy allergy known to patient  No issues with past sedation with any surgeries or procedures Patient denies ever being told they had issues or difficulty with intubation  No FH of Malignant Hyperthermia No diet pills per patient No home 02 use per patient  No blood thinners per patient  Pt denies issues with constipation  No A fib or A flutter  EMMI video to pt or via [place] 19 guidelines implemented in [place] today with Pt and RN  Pt is fully vaccinated  for Covid    Virtual pre visit completed and instructions sent through [place].  Patient has Suprep from a previously canceled colonoscopy.  Due to the COVID-19 pandemic we are asking patients to follow certain guidelines.  Pt aware of COVID protocols and [facility] guidelines

## 2020-09-14 ENCOUNTER — Other Ambulatory Visit: Payer: Self-pay

## 2020-09-15 ENCOUNTER — Ambulatory Visit (INDEPENDENT_AMBULATORY_CARE_PROVIDER_SITE_OTHER): Payer: Medicare Other | Admitting: Family Medicine

## 2020-09-15 ENCOUNTER — Encounter: Payer: Self-pay | Admitting: Family Medicine

## 2020-09-15 ENCOUNTER — Telehealth: Payer: Medicare Other | Admitting: Family Medicine

## 2020-09-15 VITALS — BP 108/60 | HR 67 | Temp 97.9°F | Wt 115.2 lb

## 2020-09-15 DIAGNOSIS — R221 Localized swelling, mass and lump, neck: Secondary | ICD-10-CM | POA: Diagnosis not present

## 2020-09-15 NOTE — Progress Notes (Signed)
Established Patient Office Visit  Subjective:  Patient ID: Debbie Marshall, female    DOB: [birth year]  Age: 75 y.o. MRN: [id number]  CC:  Chief Complaint  Patient presents with  . Cyst    Knot on the R side of neck, started as a bug bite x 1 week, very itchy, neck is now swollen    HPI Debbie Marshall presents for right neck mass and also some right anterior cervical adenopathy.  She states about a week ago she thought she may have had some sort of bite just inferior and posterior to her right ear.  She noticed a soft tissue mass just below the ear near the angle of the mandible about a week ago.  Nonpainful.  She also has noted couple of lymph nodes right anterior neck inferiorly.  She relates that she had some type of tongue cancer surgery around 1997.  She was vague on details.  She has not noted any recent intraoral masses.  No sore throat.  No fever.  Did have some recent weight loss but weight is actually up 3 pounds today from early May.  No cough.  No dyspnea.  Past Medical History:  Diagnosis Date  . ABNORMAL EXAM-BILIARY TRACT 11/14/2008  . ABNORMAL TRANSAMINASE, (LFT'S) 11/14/2008    2005 US shows fatty liver  . Allergy   . Cancer (HCC) 1996   tongue cancer  . Diabetes mellitus without complication (HCC)   . FASTING HYPERGLYCEMIA 09/07/2006  . GERD 09/07/2006  . HYPERLIPIDEMIA 09/07/2006  . HYPOTHYROIDISM 10/30/2006  . Nonspecific abnormal results of liver function study 09/07/2006  . Normal nuclear stress test 2005   stress cardiolite   . OSTEOARTHRITIS 09/07/2006  . Positive PPD    received vaccine in home country- shows positive after receiving  . 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 VITAMIN D DEFICIENCY 08/14/2009    Past Surgical History:  Procedure Laterality Date  . COLONOSCOPY    . NASAL POLYP SURGERY  04/16/2020  . POLYPECTOMY    . SALIVARY GLAND SURGERY  1996   Tumor removed on back of tongue and salivary gland    Family History  Problem  Relation Age of Onset  . Stroke Mother 48  . Arthritis Mother   . Rheum arthritis Brother   . Arthritis Brother   . Gout Brother   . Colon cancer Other   . Esophageal cancer Neg Hx   . Stomach cancer Neg Hx   . Rectal cancer Neg Hx   . Colon polyps Neg Hx     Social History   Socioeconomic History  . Marital status: Single    Spouse name: Not on file  . Number of children: Not on file  . Years of education: Not on file  . Highest education level: Not on file  Occupational History  . Occupation: PhD Professor  Tobacco Use  . Smoking status: Never Smoker  . Smokeless tobacco: Never Used  Vaping Use  . Vaping Use: Never used  Substance and Sexual Activity  . Alcohol use: Yes    Alcohol/week: 1.0 standard drink    Types: 1 Glasses of wine per week    Comment: rarely  . Drug use: No  . Sexual activity: Not on file  Other Topics Concern  . Not on file  Social History Narrative   HH of 2 Father and her    cat     PhD professor in  information systems    No tobacco or alcohol exercising regularly currently   Social Determinants of Health   Financial Resource Strain: Low Risk   . Difficulty of Paying Living Expenses: Not hard at all  Food Insecurity: No Food Insecurity  . Worried About Charity fundraiser in the Last Year: Never true  . Ran Out of Food in the Last Year: Never true  Transportation Needs: No Transportation Needs  . Lack of Transportation (Medical): No  . Lack of Transportation (Non-Medical): No  Physical Activity: Sufficiently Active  . Days of Exercise per Week: 4 days  . Minutes of Exercise per Session: 40 min  Stress: No Stress Concern Present  . Feeling of Stress : Not at all  Social Connections: Moderately Isolated  . Frequency of Communication with Friends and Family: More than three times a week  . Frequency of Social Gatherings with Friends and Family: More than three times a week  . Attends Religious Services: More than 4 times per year  .  Active Member of Clubs or Organizations: No  . Attends Archivist Meetings: Never  . Marital Status: Never married  Intimate Partner Violence: Not At Risk  . Fear of Current or Ex-Partner: No  . Emotionally Abused: No  . Physically Abused: No  . Sexually Abused: No    Outpatient Medications Prior to Visit  Medication Sig Dispense Refill  . ACCU-CHEK GUIDE test strip TEST TWICE DAILY 200 strip 3  . Accu-Chek Softclix Lancets lancets TEST TWICE DAILY AS  DIRECTED 200 each 3  . Ascorbic Acid (VITAMIN C) 500 MG CAPS Take 1 capsule by mouth daily.    . Blood Glucose Monitoring Suppl (ACCU-CHEK GUIDE) w/Device KIT USE TO CHECK BLOOD SUGAR  TWICE DAILY 1 kit 1  . Cholecalciferol 1000 UNITS capsule Take 1 capsule (1,000 Units total) by mouth daily. 90 capsule 3  . cyanocobalamin 2000 MCG tablet Take 2,500 mcg by mouth daily.    . fluticasone (FLONASE) 50 MCG/ACT nasal spray Place 2 sprays into both nostrils daily. 16 g 2  . Lancet Devices (ACCU-CHEK SOFTCLIX) lancets Test twice daily. 1 each 12  . levothyroxine (SYNTHROID) 50 MCG tablet Take 1 tablet (50 mcg total) by mouth daily. 90 tablet 1  . LUTEIN PO Take 40 mg by mouth daily.    . metFORMIN (GLUCOPHAGE-XR) 500 MG 24 hr tablet TAKE 2 TABLETS BY MOUTH  DAILY 180 tablet 1  . MULTIPLE VITAMIN PO Take 1 capsule by mouth daily.    . Omega-3 Fatty Acids (FISH OIL) 435 MG CAPS Take 1 capsule by mouth in the morning and at bedtime.    . simvastatin (ZOCOR) 40 MG tablet TAKE 1 TABLET BY MOUTH AT  BEDTIME 90 tablet 3   No facility-administered medications prior to visit.    No Known Allergies  ROS Review of Systems  Constitutional: Negative for appetite change, chills, fever and unexpected weight change.  HENT: Negative for sore throat and trouble swallowing.   Respiratory: Negative for cough and shortness of breath.   Cardiovascular: Negative for chest pain.  Hematological: Positive for adenopathy. Does not bruise/bleed easily.       Objective:    Physical Exam Vitals reviewed.  Constitutional:      Appearance: Normal appearance.  HENT:     Mouth/Throat:     Comments: No visible tongue lesions.  Posterior pharynx is clear. Neck:     Comments: She has approximately 3 x 3 cm soft tissue mass  just below the right ear with no overlying erythema.  No fluctuance.  She has a couple of palpable lymph nodes about half centimeter diameter right lower anterior cervical triangle region.  These are nontender.  No supraclavicular adenopathy. Cardiovascular:     Rate and Rhythm: Normal rate and regular rhythm.  Pulmonary:     Effort: Pulmonary effort is normal.     Breath sounds: Normal breath sounds.  Musculoskeletal:     Cervical back: Neck supple. No rigidity or tenderness.  Lymphadenopathy:     Cervical: Cervical adenopathy present.  Neurological:     Mental Status: She is alert.     BP 108/60 (BP Location: Left Arm, Patient Position: Sitting, Cuff Size: Normal)   Pulse 67   Temp 97.9 F (36.6 C) (Oral)   Wt 115 lb 3.2 oz (52.3 kg)   SpO2 96%   BMI 22.88 kg/m  Wt Readings from Last 3 Encounters:  09/15/20 115 lb 3.2 oz (52.3 kg)  09/08/20 116 lb (52.6 kg)  08/31/20 116 lb 12.8 oz (53 kg)     Health Maintenance Due  Topic Date Due  . Pneumococcal Vaccine 72-14 Years old (1 of 4 - PCV13) Never done  . Zoster Vaccines- Shingrix (1 of 2) Never done  . COLONOSCOPY (Pts 45-60yrs Insurance coverage will need to be confirmed)  01/12/2018  . URINE MICROALBUMIN  09/05/2020    There are no preventive care reminders to display for this patient.  Lab Results  Component Value Date   TSH 0.24 (L) 08/12/2020   Lab Results  Component Value Date   WBC 5.4 08/12/2020   HGB 14.0 08/12/2020   HCT 42.2 08/12/2020   MCV 89.1 08/12/2020   PLT 221.0 08/12/2020   Lab Results  Component Value Date   NA 140 08/12/2020   K 4.2 08/12/2020   CO2 30 08/12/2020   GLUCOSE 81 08/12/2020   BUN 17 08/12/2020    CREATININE 0.57 08/12/2020   BILITOT 0.6 08/12/2020   ALKPHOS 58 08/12/2020   AST 19 08/12/2020   ALT 16 08/12/2020   PROT 7.0 08/12/2020   ALBUMIN 4.6 08/12/2020   CALCIUM 10.1 08/12/2020   GFR 90.31 08/12/2020   Lab Results  Component Value Date   CHOL 126 02/26/2020   Lab Results  Component Value Date   HDL 47 (L) 02/26/2020   Lab Results  Component Value Date   LDLCALC 59 02/26/2020   Lab Results  Component Value Date   TRIG 113 02/26/2020   Lab Results  Component Value Date   CHOLHDL 2.7 02/26/2020   Lab Results  Component Value Date   HGBA1C 5.9 (A) 06/10/2020      Assessment & Plan:   Patient presents with right upper neck mass which is about 3 x 3 cm and this has the consistency almost of a lipoma.  Etiology unclear.  Easily palpable.  Doubt cystic.  She gives a history that this came up fairly rapidly about a week ago.  She does have what sounds like remote history of tongue cancer years ago but no recent intraoral lesions.  She does have a couple of palpable nodes right anterior neck more inferiorly.  In view of the above recommend further evaluation.  She already has ENT appointment set up but cannot get in until July.  We will try to schedule CT soft tissue neck to further evaluate  No orders of the defined types were placed in this encounter.   Follow-up: No follow-ups on  file.    Carolann Littler, MD

## 2020-09-15 NOTE — Patient Instructions (Signed)
We will set up CT scan of neck to further assess right neck masses.

## 2020-09-21 ENCOUNTER — Encounter: Payer: Self-pay | Admitting: Internal Medicine

## 2020-09-22 ENCOUNTER — Other Ambulatory Visit: Payer: Self-pay

## 2020-09-22 ENCOUNTER — Other Ambulatory Visit: Payer: Self-pay | Admitting: Internal Medicine

## 2020-09-22 ENCOUNTER — Ambulatory Visit (AMBULATORY_SURGERY_CENTER): Payer: Medicare Other | Admitting: Internal Medicine

## 2020-09-22 ENCOUNTER — Encounter: Payer: Self-pay | Admitting: Internal Medicine

## 2020-09-22 VITALS — BP 110/70 | HR 56 | Temp 97.7°F | Resp 18 | Ht 59.0 in | Wt 117.0 lb

## 2020-09-22 DIAGNOSIS — D122 Benign neoplasm of ascending colon: Secondary | ICD-10-CM | POA: Diagnosis not present

## 2020-09-22 DIAGNOSIS — D123 Benign neoplasm of transverse colon: Secondary | ICD-10-CM

## 2020-09-22 DIAGNOSIS — Z86010 Personal history of colonic polyps: Secondary | ICD-10-CM | POA: Diagnosis not present

## 2020-09-22 DIAGNOSIS — D120 Benign neoplasm of cecum: Secondary | ICD-10-CM | POA: Diagnosis not present

## 2020-09-22 DIAGNOSIS — E2839 Other primary ovarian failure: Secondary | ICD-10-CM

## 2020-09-22 MED ORDER — SODIUM CHLORIDE 0.9 % IV SOLN: 500.0000 mL | Freq: Once | INTRAVENOUS | Status: DC

## 2020-09-22 NOTE — Patient Instructions (Signed)
Handouts given for polyps and diverticulosis.  YOU HAD AN ENDOSCOPIC PROCEDURE TODAY AT THE [facility]:   Refer to the procedure report that was given to you for any specific questions about what was found during the examination.  If the procedure report does not answer your questions, please call your gastroenterologist to clarify.  If you requested that your care partner not be given the details of your procedure findings, then the procedure report has been included in a sealed envelope for you to review at your convenience later.  YOU SHOULD EXPECT: Some feelings of bloating in the abdomen. Passage of more gas than usual.  Walking can help get rid of the air that was put into your GI tract during the procedure and reduce the bloating. If you had a lower endoscopy (such as a colonoscopy or flexible sigmoidoscopy) you may notice spotting of blood in your stool or on the toilet paper. If you underwent a bowel prep for your procedure, you may not have a normal bowel movement for a few days.  Please Note:  You might notice some irritation and congestion in your nose or some drainage.  This is from the oxygen used during your procedure.  There is no need for concern and it should clear up in a day or so.  SYMPTOMS TO REPORT IMMEDIATELY:  Following lower endoscopy (colonoscopy or flexible sigmoidoscopy):  Excessive amounts of blood in the stool  Significant tenderness or worsening of abdominal pains  Swelling of the abdomen that is new, acute  Fever of 100F or higher  For urgent or emergent issues, a gastroenterologist can be reached at any hour by calling [phone]. Do not use MyChart messaging for urgent concerns.    DIET:  We do recommend a small meal at first, but then you may proceed to your regular diet.  Drink plenty of fluids but you should avoid alcoholic beverages for 24 hours.  ACTIVITY:  You should plan to take it easy for the rest of today and you should NOT DRIVE  or use heavy machinery until tomorrow (because of the sedation medicines used during the test).    FOLLOW UP: Our staff will call the number listed on your records 48-72 hours following your procedure to check on you and address any questions or concerns that you may have regarding the information given to you following your procedure. If we do not reach you, we will leave a message.  We will attempt to reach you two times.  During this call, we will ask if you have developed any symptoms of COVID 19. If you develop any symptoms (ie: fever, flu-like symptoms, shortness of breath, cough etc.) before then, please call [phone].  If you test positive for Covid 19 in the 2 weeks post procedure, please call and report this information to us.    If any biopsies were taken you will be contacted by phone or by letter within the next 1-3 weeks.  Please call us at [phone] if you have not heard about the biopsies in 3 weeks.    SIGNATURES/CONFIDENTIALITY: You and/or your care partner have signed paperwork which will be entered into your electronic medical record.  These signatures attest to the fact that that the information above on your After Visit Summary has been reviewed and is understood.  Full responsibility of the confidentiality of this discharge information lies with you and/or your care-partner.  

## 2020-09-22 NOTE — Progress Notes (Signed)
Called to room to assist during endoscopic procedure.  Patient ID and intended procedure confirmed with present staff. Received instructions for my participation in the procedure from the performing physician.  

## 2020-09-22 NOTE — Progress Notes (Signed)
VS by CW  I have reviewed the patient's medical history in detail and updated the computerized patient record.  

## 2020-09-22 NOTE — Progress Notes (Signed)
PT taken to PACU. Monitors in place. VSS. Report given to RN. 

## 2020-09-22 NOTE — Op Note (Signed)
[facility] Patient Name: Debbie Marshall Procedure Date: 09/22/2020 2:17 PM MRN: [id number] Endoscopist: Docia Chuck. Henrene Pastor , MD Age: 75 Referring MD:  Date of Birth: Sep 08, 1945 Gender: Female Account #: [id number] Procedure:                Colonoscopy with cold snare polypectomy x 3 Indications:              High risk colon cancer surveillance: Personal                            history of multiple (3 or more) adenomas. Previous                            examinations 2005, 2016 Medicines:                Monitored Anesthesia Care Procedure:                Pre-Anesthesia Assessment:                           - Prior to the procedure, a History and Physical                            was performed, and patient medications and                            allergies were reviewed. The patient's tolerance of                            previous anesthesia was also reviewed. The risks                            and benefits of the procedure and the sedation                            options and risks were discussed with the patient.                            All questions were answered, and informed consent                            was obtained. Prior Anticoagulants: The patient has                            taken no previous anticoagulant or antiplatelet                            agents. ASA Grade Assessment: II - A patient with                            mild systemic disease. After reviewing the risks                            and benefits, the patient was deemed in  satisfactory condition to undergo the procedure.                           After obtaining informed consent, the colonoscope                            was passed under direct vision. Throughout the                            procedure, the patient's blood pressure, pulse, and                            oxygen saturations were monitored continuously. The                            Olympus  [id number]-[id number] (#[id number]) Colonoscope was                            introduced through the anus and advanced to the the                            cecum, identified by appendiceal orifice and                            ileocecal valve. The ileocecal valve, appendiceal                            orifice, and rectum were photographed. The quality                            of the bowel preparation was excellent. The                            colonoscopy was performed without difficulty. The                            patient tolerated the procedure well. The bowel                            preparation used was SUPREP via split dose                            instruction. Scope In: 2:26:05 PM Scope Out: 4:25:95 PM Scope Withdrawal Time: 0 hours 15 minutes 16 seconds  Total Procedure Duration: 0 hours 20 minutes 11 seconds  Findings:                 Three polyps were found in the transverse colon,                            ascending colon and cecum. The polyps were 3 to 8                            mm in size. These polyps were removed with a cold  snare. Resection and retrieval were complete.                           A few small-mouthed diverticula were found in the                            sigmoid colon and ascending colon.                           The exam was otherwise without abnormality on                            direct and retroflexion views. Complications:            No immediate complications. Estimated blood loss:                            None. Estimated Blood Loss:     Estimated blood loss: none. Impression:               - Three 3 to 8 mm polyps in the transverse colon,                            in the ascending colon and in the cecum, removed                            with a cold snare. Resected and retrieved.                           - Diverticulosis in the sigmoid colon and in the                            ascending colon.                            - The examination was otherwise normal on direct                            and retroflexion views. Recommendation:           - Repeat colonoscopy in 3 years for surveillance.                           - Patient has a contact number available for                            emergencies. The signs and symptoms of potential                            delayed complications were discussed with the                            patient. Return to normal activities tomorrow.                            Written discharge  instructions were provided to the                            patient.                           - Resume previous diet.                           - Continue present medications.                           - Await pathology results. Docia Chuck. Henrene Pastor, MD 09/22/2020 2:53:39 PM This report has been signed electronically.

## 2020-09-24 ENCOUNTER — Telehealth: Payer: Self-pay

## 2020-09-24 ENCOUNTER — Telehealth: Payer: Self-pay | Admitting: *Deleted

## 2020-09-24 NOTE — Telephone Encounter (Signed)
First attempt follow up call, no vm set up.

## 2020-09-24 NOTE — Telephone Encounter (Signed)
  Follow up Call-  Call back number 09/22/2020  Post procedure Call Back phone  # [phone]  Permission to leave phone message Yes  Some recent data might be hidden     Patient questions:  Do you have a fever, pain , or abdominal swelling? No. Pain Score  0 *  Have you tolerated food without any problems? Yes.    Have you been able to return to your normal activities? Yes.    Do you have any questions about your discharge instructions: Diet   No. Medications  No. Follow up visit  No.  Do you have questions or concerns about your Care? No.  Actions: * If pain score is 4 or above: No action needed, pain <4.   Have you developed a fever since your procedure? no  2.   Have you had an respiratory symptoms (SOB or cough) since your procedure? no  3.   Have you tested positive for COVID 19 since your procedure no  4.   Have you had any family members/close contacts diagnosed with the COVID 19 since your procedure?  no   If yes to any of these questions please route to Joylene John, RN and Joella Prince, RN

## 2020-09-25 ENCOUNTER — Other Ambulatory Visit: Payer: Medicare Other

## 2020-09-30 ENCOUNTER — Encounter: Payer: Self-pay | Admitting: Internal Medicine

## 2020-10-01 ENCOUNTER — Telehealth: Payer: Self-pay

## 2020-10-01 ENCOUNTER — Other Ambulatory Visit: Payer: Self-pay

## 2020-10-01 DIAGNOSIS — E559 Vitamin D deficiency, unspecified: Secondary | ICD-10-CM

## 2020-10-01 NOTE — Telephone Encounter (Signed)
Vitamin D lab orders have been placed.

## 2020-10-01 NOTE — Telephone Encounter (Signed)
-----   Message from Burnis Medin, MD sent at 10/01/2020  9:03 AM EDT ----- Regarding: RE: Repeat vitamin D Ok  Marieli Rudy can you place order for VIt D level future  dx vit d deficiency that can be dont at her next lab visit? WP ----- Message ----- From: Viona Gilmore, [facility] Sent: 09/08/2020  12:00 AM EDT To: Burnis Medin, MD Subject: Repeat vitamin D                               Hi!  I had the pleasure of meeting with Ms. Almas and noticed that she has had a previous vitamin D deficiency and the level was never rechecked. She wasn't aware of this and has been taking the 1000 units consistently. Can you possibly order this to be added onto her labs the next time she comes in for lab work?  Thanks, Maddie

## 2020-10-01 NOTE — Progress Notes (Signed)
T

## 2020-10-09 ENCOUNTER — Other Ambulatory Visit: Payer: Self-pay

## 2020-10-09 ENCOUNTER — Ambulatory Visit
Admission: RE | Admit: 2020-10-09 | Discharge: 2020-10-09 | Disposition: A | Discharge status: Home / Self Care | Payer: Medicare Other | Source: Ambulatory Visit | Attending: Internal Medicine | Admitting: Internal Medicine

## 2020-10-09 DIAGNOSIS — E2839 Other primary ovarian failure: Secondary | ICD-10-CM

## 2020-10-09 DIAGNOSIS — M8589 Other specified disorders of bone density and structure, multiple sites: Secondary | ICD-10-CM | POA: Diagnosis not present

## 2020-10-09 DIAGNOSIS — Z780 Asymptomatic menopausal state: Secondary | ICD-10-CM | POA: Diagnosis not present

## 2020-10-12 NOTE — Progress Notes (Signed)
Dexa shows  mild mod low bone density . Adequate vit d and weight bearing exercise . Can repeat dexa in 2-3 years .

## 2020-10-15 ENCOUNTER — Ambulatory Visit: Payer: Medicare Other | Admitting: Family Medicine

## 2020-10-19 ENCOUNTER — Other Ambulatory Visit: Payer: Self-pay

## 2020-10-19 ENCOUNTER — Other Ambulatory Visit (INDEPENDENT_AMBULATORY_CARE_PROVIDER_SITE_OTHER): Payer: Medicare Other

## 2020-10-19 DIAGNOSIS — E538 Deficiency of other specified B group vitamins: Secondary | ICD-10-CM

## 2020-10-19 DIAGNOSIS — E559 Vitamin D deficiency, unspecified: Secondary | ICD-10-CM

## 2020-10-19 DIAGNOSIS — E039 Hypothyroidism, unspecified: Secondary | ICD-10-CM

## 2020-10-19 LAB — T4, FREE: Free T4: 0.8 ng/dL (ref 0.60–1.60)

## 2020-10-19 LAB — VITAMIN B12: Vitamin B-12: 486 pg/mL (ref 211–911)

## 2020-10-19 LAB — VITAMIN D 25 HYDROXY (VIT D DEFICIENCY, FRACTURES): VITD: 34.83 ng/mL (ref 30.00–100.00)

## 2020-10-19 LAB — TSH: TSH: 4.9 u[IU]/mL (ref 0.35–5.50)

## 2020-10-20 ENCOUNTER — Ambulatory Visit (INDEPENDENT_AMBULATORY_CARE_PROVIDER_SITE_OTHER): Payer: Medicare Other | Admitting: Otolaryngology

## 2020-10-20 DIAGNOSIS — R590 Localized enlarged lymph nodes: Secondary | ICD-10-CM | POA: Diagnosis not present

## 2020-10-20 NOTE — Progress Notes (Signed)
HPI: Debbie Marshall is a 75 y.o. female who returns today for evaluation of enlarged lymph nodes on the right side of her neck.  She first noticed this a couple months ago following what she thought was insect bite behind her ear she developed swelling of some lymphadenopathy in the right neck deep to the sternocleidomastoid muscle.  The 1 in the mid neck was firm at that time.  He has subsequently reduced in size and is not really palpable at this point when she feels her neck.  She wanted things checked by myself.  She is doing well presently with no fevers and no sore throat..  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  Dr Lucia Gaskins   VITAMIN D DEFICIENCY 08/14/2009   Past Surgical History:  Procedure Laterality Date   COLONOSCOPY     NASAL POLYP SURGERY  04/16/2020   POLYPECTOMY     SALIVARY GLAND SURGERY  1996   Tumor removed on back of tongue and salivary gland   Social History   Socioeconomic History   Marital status: Single    Spouse name: Not on file   Number of children: Not on file   Years of education: Not on file   Highest education level: Not on file  Occupational History   Occupation: PhD Professor  Tobacco Use   Smoking status: Never   Smokeless tobacco: Never  Vaping Use   Vaping Use: Never used  Substance and Sexual Activity   Alcohol use: Yes    Alcohol/week: 1.0 standard  drink    Types: 1 Glasses of wine per week    Comment: rarely   Drug use: No   Sexual activity: Not on file  Other Topics Concern   Not on file  Social History Narrative   HH of 2 Father and her    cat     PhD professor in information systems    No tobacco or alcohol exercising regularly currently   Social Determinants of Health   Financial Resource Strain: Low Risk    Difficulty of Paying Living Expenses: Not hard at all  Food Insecurity: No Food Insecurity   Worried About Charity fundraiser in the Last Year: Never true   Arboriculturist in the Last Year: Never true  Transportation Needs: Not on file  Physical Activity: Sufficiently Active   Days of Exercise per Week: 4 days   Minutes of Exercise per Session: 40 min  Stress: No Stress Concern Present   Feeling of Stress : Not at all  Social Connections: Moderately Isolated   Frequency of Communication with Friends and Family: More than three times a week   Frequency of Social Gatherings with Friends and Family: More than three times a week   Attends Religious Services: More than 4 times per year   Active  Member of Clubs or Organizations: No   Attends Music therapist: Never   Marital Status: Never married   Family History  Problem Relation Age of Onset   Stroke Mother 95   Arthritis Mother    Rheum arthritis Brother    Arthritis Brother    Gout Brother    Colon cancer Other    Esophageal cancer Neg Hx    Stomach cancer Neg Hx    Rectal cancer Neg Hx    Colon polyps Neg Hx    No Known Allergies Prior to Admission medications   Medication Sig Start Date End Date Taking? Authorizing Provider  ACCU-CHEK GUIDE test strip TEST TWICE DAILY 05/11/20   Panosh, Standley Brooking, MD  Accu-Chek Softclix Lancets lancets TEST TWICE DAILY AS  DIRECTED 05/11/20   Panosh, Standley Brooking, MD  Ascorbic Acid (VITAMIN C) 500 MG CAPS Take 1 capsule by mouth daily.    [provider]  Blood Glucose Monitoring Suppl (ACCU-CHEK  GUIDE) w/Device KIT USE TO CHECK BLOOD SUGAR  TWICE DAILY 12/18/19   Panosh, Standley Brooking, MD  Cholecalciferol 1000 UNITS capsule Take 1 capsule (1,000 Units total) by mouth daily. 10/14/11   Panosh, Standley Brooking, MD  cyanocobalamin 2000 MCG tablet Take 2,500 mcg by mouth daily.    [provider]  fluticasone (FLONASE) 50 MCG/ACT nasal spray Place 2 sprays into both nostrils daily. 09/13/17   Martin[place], Betty G, MD  Lancet Devices [facility]) lancets Test twice daily. 06/12/13   Panosh, Standley Brooking, MD  levothyroxine (SYNTHROID) 50 MCG tablet Take 1 tablet (50 mcg total) by mouth daily. 08/17/20   Panosh, Standley Brooking, MD  LUTEIN PO Take 40 mg by mouth daily.    [provider]  metFORMIN (GLUCOPHAGE-XR) 500 MG 24 hr tablet TAKE 2 TABLETS BY MOUTH  DAILY 07/13/20   Panosh, Standley Brooking, MD  MULTIPLE VITAMIN PO Take 1 capsule by mouth daily.    [provider]  Omega-3 Fatty Acids (FISH OIL) 435 MG CAPS Take 1 capsule by mouth in the morning and at bedtime.    [provider]  simvastatin (ZOCOR) 40 MG tablet TAKE 1 TABLET BY MOUTH AT  BEDTIME 07/15/20   Panosh, Standley Brooking, MD     Positive ROS: Otherwise negative  All other systems have been reviewed and were otherwise negative with the exception of those mentioned in the HPI and as above.  Physical Exam: Constitutional: Alert, well-appearing, no acute distress Ears: External ears without lesions or tenderness. Ear canals are clear bilaterally with intact, clear TMs.  Nasal: External nose without lesions. Septum relatively midline.  She is status post previous surgery with clear nasal passages and no significant recurrent polyps.  No signs of infection..  Oral: Lips and gums without lesions. Tongue and palate mucosa without lesions. Posterior oropharynx clear.  Tonsil regions appear benign bilaterally.  Indirect laryngoscopy revealed a clear hypopharynx and larynx. Neck: No palpable adenopathy or masses.  She has a few small lymph nodes  palpable in the neck but no significantly enlarged lymph nodes.  No palpable parotid masses or parotid swelling. Respiratory: Breathing comfortably  Skin: No facial/neck lesions or rash noted.  Procedures  Assessment: Resolved lymphadenopathy I suspect was probably inflammatory.  Plan: Reassured her of normal examination today.   Radene Journey, MD

## 2020-10-23 NOTE — Progress Notes (Signed)
In normal range, will review at  upcoming  visit

## 2020-11-02 ENCOUNTER — Ambulatory Visit (INDEPENDENT_AMBULATORY_CARE_PROVIDER_SITE_OTHER): Payer: Medicare Other | Admitting: Internal Medicine

## 2020-11-02 ENCOUNTER — Other Ambulatory Visit: Payer: Self-pay | Admitting: Internal Medicine

## 2020-11-02 ENCOUNTER — Encounter: Payer: Self-pay | Admitting: Internal Medicine

## 2020-11-02 ENCOUNTER — Other Ambulatory Visit: Payer: Self-pay

## 2020-11-02 VITALS — BP 118/60 | HR 56 | Temp 97.9°F | Ht 59.0 in | Wt 112.6 lb

## 2020-11-02 DIAGNOSIS — E559 Vitamin D deficiency, unspecified: Secondary | ICD-10-CM

## 2020-11-02 DIAGNOSIS — E039 Hypothyroidism, unspecified: Secondary | ICD-10-CM | POA: Diagnosis not present

## 2020-11-02 DIAGNOSIS — E538 Deficiency of other specified B group vitamins: Secondary | ICD-10-CM

## 2020-11-02 DIAGNOSIS — Z79899 Other long term (current) drug therapy: Secondary | ICD-10-CM

## 2020-11-02 DIAGNOSIS — E785 Hyperlipidemia, unspecified: Secondary | ICD-10-CM

## 2020-11-02 DIAGNOSIS — R634 Abnormal weight loss: Secondary | ICD-10-CM

## 2020-11-02 DIAGNOSIS — E118 Type 2 diabetes mellitus with unspecified complications: Secondary | ICD-10-CM

## 2020-11-02 DIAGNOSIS — R200 Anesthesia of skin: Secondary | ICD-10-CM

## 2020-11-02 LAB — POCT GLYCOSYLATED HEMOGLOBIN (HGB A1C): Hemoglobin A1C: 6 % — AB (ref 4.0–5.6)

## 2020-11-02 NOTE — Patient Instructions (Addendum)
Stay on B12 supplementation  . Vit D  Same thyroid dosing .  Preventive visit  around November  or as needed .   Hga1c is 6.0 today

## 2020-11-02 NOTE — Progress Notes (Signed)
Chief Complaint  Patient presents with   Follow-up     HPI: Debbie Marshall 75 y.o. come in for Chronic disease management number of issues  Thyroid this was decreased to 50 mcg/day taking regularly Vit D getting 1000 units of vitamin D a day B12 taking daily B12 since lab test showed low level B12 184 Was concerned about weight loss although may be not continuing was not paying attention some stress with father's health Care  taking he had a dental procedure and fell  2 weeks ago.  Is doing better now. Also had episode of low felt was a mass on her right neck that may have been reactive lymphadenopathy chain saw Dr. Lucia Gaskins at that point it was resolved and no further work-up was needed. No new numbness tingling in the right foot.  ROS: See pertinent positives and negatives per HPI.  Right shoulder still bothersome with internal posterior rotation hair  Past Medical History:  Diagnosis Date   ABNORMAL EXAM-BILIARY TRACT 11/14/2008   ABNORMAL TRANSAMINASE, (LFT'S) 11/14/2008    2005 US shows fatty liver   Allergy    Cancer ([place]) 1996   tongue cancer   Diabetes mellitus without complication ([place])    FASTING HYPERGLYCEMIA 09/07/2006   GERD 09/07/2006   HYPERLIPIDEMIA 09/07/2006   HYPOTHYROIDISM 10/30/2006   Nonspecific abnormal results of liver function study 09/07/2006   Normal nuclear stress test 2005   stress cardiolite    OSTEOARTHRITIS 09/07/2006   Positive PPD    received vaccine [place] country- shows positive after receiving   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  Dr Lucia Gaskins   VITAMIN D DEFICIENCY 08/14/2009    Family History  Problem Relation Age of Onset   Stroke Mother 41   Arthritis Mother    Rheum arthritis Brother    Arthritis Brother    Gout Brother    Colon cancer Other    Esophageal cancer Neg Hx    Stomach cancer Neg Hx    Rectal cancer Neg Hx    Colon polyps Neg Hx     Social History   Socioeconomic History   Marital status: Single     Spouse name: Not on file   Number of children: Not on file   Years of education: Not on file   Highest education level: Not on file  Occupational History   Occupation: PhD Professor  Tobacco Use   Smoking status: Never   Smokeless tobacco: Never  Vaping Use   Vaping Use: Never used  Substance and Sexual Activity   Alcohol use: Yes    Alcohol/week: 1.0 standard drink    Types: 1 Glasses of wine per week    Comment: rarely   Drug use: No   Sexual activity: Not on file  Other Topics Concern   Not on file  Social History Narrative   HH of 2 Father and her    cat     PhD professor in information systems    No tobacco or alcohol exercising regularly currently   Social Determinants of Health   Financial Resource Strain: Low Risk    Difficulty of Paying Living Expenses: Not hard at all  Food Insecurity: No Food Insecurity   Worried About Charity fundraiser in the Last Year: Never true   Arboriculturist in the Last Year: Never true  Transportation Needs: Not on file  Physical Activity: Sufficiently Active   Days of Exercise  per Week: 4 days   Minutes of Exercise per Session: 40 min  Stress: No Stress Concern Present   Feeling of Stress : Not at all  Social Connections: Moderately Isolated   Frequency of Communication with Friends and Family: More than three times a week   Frequency of Social Gatherings with Friends and Family: More than three times a week   Attends Religious Services: More than 4 times per year   Active Member of [employer] or Organizations: No   Attends Archivist Meetings: Never   Marital Status: Never married    Outpatient Medications Prior to Visit  Medication Sig Dispense Refill   ACCU-CHEK GUIDE test strip TEST TWICE DAILY 200 strip 3   Accu-Chek Softclix Lancets lancets TEST TWICE DAILY AS  DIRECTED 200 each 3   Ascorbic Acid (VITAMIN C) 500 MG CAPS Take 1 capsule by mouth daily.     Blood Glucose Monitoring Suppl (ACCU-CHEK GUIDE) w/Device  KIT USE TO CHECK BLOOD SUGAR  TWICE DAILY 1 kit 1   Cholecalciferol 1000 UNITS capsule Take 1 capsule (1,000 Units total) by mouth daily. 90 capsule 3   cyanocobalamin 2000 MCG tablet Take 2,500 mcg by mouth daily.     fluticasone (FLONASE) 50 MCG/ACT nasal spray Place 2 sprays into both nostrils daily. 16 g 2   Lancet Devices (ACCU-CHEK SOFTCLIX) lancets Test twice daily. 1 each 12   levothyroxine (SYNTHROID) 50 MCG tablet Take 1 tablet (50 mcg total) by mouth daily. 90 tablet 1   LUTEIN PO Take 40 mg by mouth daily.     metFORMIN (GLUCOPHAGE-XR) 500 MG 24 hr tablet TAKE 2 TABLETS BY MOUTH  DAILY 180 tablet 1   MULTIPLE VITAMIN PO Take 1 capsule by mouth daily.     Omega-3 Fatty Acids (FISH OIL) 435 MG CAPS Take 1 capsule by mouth in the morning and at bedtime.     simvastatin (ZOCOR) 40 MG tablet TAKE 1 TABLET BY MOUTH AT  BEDTIME 90 tablet 3   No facility-administered medications prior to visit.     EXAM:  BP 118/60 (BP Location: Left Arm, Patient Position: Sitting, Cuff Size: Normal)   Pulse (!) 56   Temp 97.9 F (36.6 C) (Oral)   Ht 4' 11"  (1.499 m)   Wt 112 lb 9.6 oz (51.1 kg)   SpO2 97%   BMI 22.74 kg/m   Body mass index is 22.74 kg/m. Wt Readings from Last 3 Encounters:  11/02/20 112 lb 9.6 oz (51.1 kg)  09/22/20 117 lb (53.1 kg)  09/15/20 115 lb 3.2 oz (52.3 kg)  Weight was 112 at the May 2022 visit  GENERAL: vitals reviewed and listed above, alert, oriented, appears well hydrated and in no acute distress HEENT: atraumatic, conjunctiva  clear, no obvious abnormalities on inspection of external nose and ears OP : masked  NECK: no obvious masses on inspection palpation  LUNGS: clear to auscultation bilaterally, no wheezes, rales or rhonchi, good air movement CV: HRRR, no clubbing cyanosis or  peripheral edema nl cap refill  MS: moves all extremities without noticeable focal  abnormality PSYCH: pleasant and cooperative, no obvious depression or anxiety Lab Results   Component Value Date   WBC 5.4 08/12/2020   HGB 14.0 08/12/2020   HCT 42.2 08/12/2020   PLT 221.0 08/12/2020   GLUCOSE 81 08/12/2020   CHOL 126 02/26/2020   TRIG 113 02/26/2020   HDL 47 (L) 02/26/2020   LDLDIRECT 163.6 10/23/2006   LDLCALC 59 02/26/2020  ALT 16 08/12/2020   AST 19 08/12/2020   NA 140 08/12/2020   K 4.2 08/12/2020   CL 103 08/12/2020   CREATININE 0.57 08/12/2020   BUN 17 08/12/2020   CO2 30 08/12/2020   TSH 4.90 10/19/2020   INR 1.0 ratio 11/14/2008   HGBA1C 6.0 (A) 11/02/2020   MICROALBUR 1.2 09/06/2019   BP Readings from Last 3 Encounters:  11/02/20 118/60  09/22/20 110/70  09/15/20 108/60   Lab Results  Component Value Date   VITAMINB12 486 10/19/2020      ASSESSMENT AND PLAN:  Discussed the following assessment and plan:  Hypothyroidism, unspecified type - TSH now in range for continue  Vitamin D deficiency - In range low normal continue supplementation  Low serum vitamin B12 - In better range 486 continue supplementation  Controlled type 2 diabetes mellitus with complication, without long-term current use of insulin ([place]) - Still controlled continue - Plan: POCT glycosylated hemoglobin (Hb A1C), CANCELED: Microalbumin / creatinine urine ratio  Weight loss Has lab appointment and CPX in November we will place orders today that can get labs ahead of time. Weight loss is stable based on our scales from May but in the system has been up and down.  We will monitor Is possible her thyroid being off was a trigger plus other. At this time we will monitor and see her in the fall. -Patient advised to return or notify health care team  if  new concerns arise.  Patient Instructions  Stay on B12 supplementation  . Vit D  Same thyroid dosing .  Preventive visit  around November  or as needed .   Hga1c is 6.0 today     Mariann Laster K. Miko Markwood M.D.

## 2020-11-24 ENCOUNTER — Telehealth: Payer: Self-pay | Admitting: Pharmacist

## 2020-11-24 NOTE — Chronic Care Management (AMB) (Signed)
Chronic Care Management Pharmacy Assistant   Name: Debbie Marshall  MRN: [id number] DOB: [birth year]  Reason for Encounter: Disease State/ Diabetes Assessment Call.   Conditions to be addressed/monitored: DMII   Recent office visits:  11/02/20 Shanon Ace MD (PCP) - seen for hypothyroidism and other chronic conditions. No medication changes. Preventative visit in November or sooner if needed.  09/15/20 Carolann Littler MD (Family Medicine) - seen for mass of right side of neck. No medication changes. CT scan ordered and no follow up noted.   Recent consult visits:  10/20/20 Melony Overly MD (Otolaryngology) - seen for lymphadenopathy cervical. No medication changes. No follow up noted.   08/31/20 Sherene Sires MD (Sports Medicine) - seen for bilateral shoulder pain and other issues. Referral to physical therapy placed. No medication changes. Follow up in 6 weeks.  Hospital visits:  None in previous 6 months  Medications: Outpatient Encounter Medications as of 11/24/2020  Medication Sig   ACCU-CHEK GUIDE test strip TEST TWICE DAILY   Accu-Chek Softclix Lancets lancets TEST TWICE DAILY AS  DIRECTED   Ascorbic Acid (VITAMIN C) 500 MG CAPS Take 1 capsule by mouth daily.   Blood Glucose Monitoring Suppl (ACCU-CHEK GUIDE) w/Device KIT USE TO CHECK BLOOD SUGAR  TWICE DAILY   Cholecalciferol 1000 UNITS capsule Take 1 capsule (1,000 Units total) by mouth daily.   cyanocobalamin 2000 MCG tablet Take 2,500 mcg by mouth daily.   fluticasone (FLONASE) 50 MCG/ACT nasal spray Place 2 sprays into both nostrils daily.   Lancet Devices (ACCU-CHEK SOFTCLIX) lancets Test twice daily.   levothyroxine (SYNTHROID) 50 MCG tablet Take 1 tablet (50 mcg total) by mouth daily.   LUTEIN PO Take 40 mg by mouth daily.   metFORMIN (GLUCOPHAGE-XR) 500 MG 24 hr tablet TAKE 2 TABLETS BY MOUTH  DAILY   MULTIPLE VITAMIN PO Take 1 capsule by mouth daily.   Omega-3 Fatty Acids (FISH OIL) 435 MG CAPS Take 1 capsule by  mouth in the morning and at bedtime.   simvastatin (ZOCOR) 40 MG tablet TAKE 1 TABLET BY MOUTH AT  BEDTIME   No facility-administered encounter medications on file as of 11/24/2020.   Fill History: ACCU-CHEK GUIDE   STRP 09/03/2020 90   ACCU-CHEK SOFTCLIX LANCETS   MISC 09/03/2020 90   LEVOTHYROXINE SODIUM  50 MCG TABS 08/17/2020 90   SIMVASTATIN  40 MG TABS 08/24/2020 90   METFORMIN HYDROCHLORIDE ER  500 MG TB24 08/24/2020 90   Recent Relevant Labs: Lab Results  Component Value Date/Time   HGBA1C 6.0 (A) 11/02/2020 10:07 AM   HGBA1C 5.9 (A) 06/10/2020 10:50 AM   HGBA1C 6.7 (H) 02/26/2020 12:54 PM   HGBA1C 6.4 09/06/2019 10:16 AM   MICROALBUR 1.2 09/06/2019 10:16 AM   MICROALBUR <0.7 06/21/2018 10:26 AM    Kidney Function Lab Results  Component Value Date/Time   CREATININE 0.57 08/12/2020 11:51 AM   CREATININE 0.70 02/26/2020 12:54 PM   CREATININE 0.68 09/06/2019 10:16 AM   GFR 90.31 08/12/2020 11:51 AM   GFRNONAA 87 02/26/2020 12:54 PM   GFRAA 100 02/26/2020 12:54 PM    Current antihyperglycemic regimen:  Metformin 511m - take 2 tablets by mouth daily.  What recent interventions/DTPs have been made to improve glycemic control:  None. Have there been any recent hospitalizations or ED visits since last visit with CPP? No Patient denies hypoglycemic symptoms, including None Patient denies hyperglycemic symptoms, including none How often are you checking your blood sugar? before meals What are your  blood sugars ranging?  Before meals: usually between 95-105 After meals: usually 1 hour after it is around 135-145 During the week, how often does your blood glucose drop below 70? Never Are you checking your feet daily/regularly? Yes. Checks her feet everyday.  Adherence Review: Is the patient currently on a STATIN medication? Yes Is the patient currently on ACE/ARB medication? No Does the patient have >5 day gap between last estimated fill dates? No  Notes: Spoke  with patient and reviewed medications as listed. Patient reports taking all medications as prescribed and no issues at this time. Patient does however now take her metformin two tablets twice daily instead of once. Patient stated for breakfast she tends to have a blended banana and blueberry smoothie with some yogurt and an apple with eggs and cheese and a piece of toast. Patient has 2 cup of coffee with breakfast as well. For lunch patient has some rice and vegetables and for dinner she has meat like fish or chicken with rice and vegetables. Patient has two glasses of boiled ginger water with each meal and 2 glass in between meals. Patient walks her neighborhood with her neighbor for 45 minutes one day a week and she attends water aerobics class twice a week. Patient is able to do house work with no restrictions.  Care Gaps:  AWV - message sent to Ramond Craver to schedule. Zoster vaccines - never done Covid - 19 vaccine booster 4 - overdue since 06/09/20 Flu vaccine - due  Star Rating Drugs:  Metformin 572m - last filled on 08/24/20 90DS at [facility] Simvastatin 426m- last [facility] 08[facility]/20 [phone]

## 2020-12-21 ENCOUNTER — Telehealth: Payer: Self-pay | Admitting: Internal Medicine

## 2020-12-21 DIAGNOSIS — E118 Type 2 diabetes mellitus with unspecified complications: Secondary | ICD-10-CM

## 2020-12-21 NOTE — Telephone Encounter (Signed)
Patient is interested in getting the following done per message in MyChart:  Zoster Vaccines- Shingrix Urine Microalbumin Influenza Vaccine

## 2020-12-23 NOTE — Telephone Encounter (Signed)
Lab and NV scheduled for 09/16 and pt is aware to go to pharmacy for shingrx injection.

## 2020-12-23 NOTE — Telephone Encounter (Signed)
Please arrange for her to get influenza vaccine and urine microalbumin creatinine ratio.  Diagnosis diabetes  She will have to get the Shingrix vaccine at a pharmacy under her pharmacy plan.  It will not be covered if given in the office.

## 2020-12-25 ENCOUNTER — Ambulatory Visit: Payer: Medicare Other

## 2020-12-25 ENCOUNTER — Other Ambulatory Visit: Payer: Medicare Other

## 2021-01-12 ENCOUNTER — Telehealth: Payer: Self-pay | Admitting: Pharmacist

## 2021-01-12 NOTE — Chronic Care Management (AMB) (Signed)
Chronic Care Management Pharmacy Assistant   Name: Debbie Marshall  MRN: [id number] DOB: May 05, 1945  Reason for Encounter: Disease State/ Diabetes Assessment Call.    Conditions to be addressed/monitored: DMII  Recent office visits:  None.  Recent consult visits:  None.    Hospital visits:  None in previous 6 months  Medications: Outpatient Encounter Medications as of 01/12/2021  Medication Sig   ACCU-CHEK GUIDE test strip TEST TWICE DAILY   Accu-Chek Softclix Lancets lancets TEST TWICE DAILY AS  DIRECTED   Ascorbic Acid (VITAMIN C) 500 MG CAPS Take 1 capsule by mouth daily.   Blood Glucose Monitoring Suppl (ACCU-CHEK GUIDE) w/Device KIT USE TO CHECK BLOOD SUGAR  TWICE DAILY   Cholecalciferol 1000 UNITS capsule Take 1 capsule (1,000 Units total) by mouth daily.   cyanocobalamin 2000 MCG tablet Take 2,500 mcg by mouth daily.   fluticasone (FLONASE) 50 MCG/ACT nasal spray Place 2 sprays into both nostrils daily.   Lancet Devices (ACCU-CHEK SOFTCLIX) lancets Test twice daily.   levothyroxine (SYNTHROID) 50 MCG tablet Take 1 tablet (50 mcg total) by mouth daily.   LUTEIN PO Take 40 mg by mouth daily.   metFORMIN (GLUCOPHAGE-XR) 500 MG 24 hr tablet TAKE 2 TABLETS BY MOUTH  DAILY   MULTIPLE VITAMIN PO Take 1 capsule by mouth daily.   Omega-3 Fatty Acids (FISH OIL) 435 MG CAPS Take 1 capsule by mouth in the morning and at bedtime.   simvastatin (ZOCOR) 40 MG tablet TAKE 1 TABLET BY MOUTH AT  BEDTIME   No facility-administered encounter medications on file as of 01/12/2021.   Fill History: ACCU-CHEK SOFTCLIX LANCETS   MISC 11/25/2020 90   ACCU-CHEK GUIDE   STRP 11/25/2020 90   LEVOTHYROXINE SODIUM  50 MCG TABS 12/14/2020 90   SIMVASTATIN  40 MG TABS 12/14/2020 90   METFORMIN HYDROCHLORIDE ER  500 MG TB24 12/14/2020 90   Recent Relevant Labs: Lab Results  Component Value Date/Time   HGBA1C 6.0 (A) 11/02/2020 10:07 AM   HGBA1C 5.9 (A) 06/10/2020 10:50 AM   HGBA1C 6.7 (H)  02/26/2020 12:54 PM   HGBA1C 6.4 09/06/2019 10:16 AM   MICROALBUR 1.2 09/06/2019 10:16 AM   MICROALBUR <0.7 06/21/2018 10:26 AM    Kidney Function Lab Results  Component Value Date/Time   CREATININE 0.57 08/12/2020 11:51 AM   CREATININE 0.70 02/26/2020 12:54 PM   CREATININE 0.68 09/06/2019 10:16 AM   GFR 90.31 08/12/2020 11:51 AM   GFRNONAA 87 02/26/2020 12:54 PM   GFRAA 100 02/26/2020 12:54 PM    Current antihyperglycemic regimen:  Metformin 56m - take 2 tablets by mouth daily.  What recent interventions/DTPs have been made to improve glycemic control:  None. Have there been any recent hospitalizations or ED visits since last visit with CPP? No Patient denies hypoglycemic symptoms, including None Patient denies hyperglycemic symptoms, including none How often are you checking your blood sugar? twice daily What are your blood sugars ranging? Mornings fasting between 90-110. After meal 1 hour between 135-200 depending on what she eats. During the week, how often does your blood glucose drop below 70? Never Are you checking your feet daily/regularly? Yes she checks her feet. She has been getting leg cramps that feel like twisting.   Adherence Review: Is the patient currently on a STATIN medication? Yes Is the patient currently on ACE/ARB medication? No Does the patient have >5 day gap between last estimated fill dates? No  Notes: Spoke with patient and reviewed all medications  as prescribed. Patient reports no changes in medications. Patient stated that she has started getting leg cramps lately. However, she has been taking her statin medication for over 25 years. She eats half a banana every morning for her potassium. She stated the leg cramps feel like a painful twisting sensation. Message sent to Jeni Salles to make aware and patient stated she will be seeing her PCP regarding the leg cramps as well. Patient stated she tends to have a half a banana, eggs and rice for breakfast  with some hot tea. For lunch she has toast usually. She drinks plenty of water. For activity she does a water aerobics twice a week. She walks around her neighborhood every morning. She also does some shoulder exercises for her shoulder. Patient thanked me for my call.   Care Gaps:  AWV - message sent to Ramond Craver to schedule. Zoster vaccines - never done Covid - 19 vaccine booster 4 - overdue since 06/09/20 Flu vaccine - due  Star Rating Drugs:  Metformin 558m - last filled on 12/14/20 90DS at [facility] Simvastatin 441m- last filled on 12/14/20 [phone]

## 2021-02-14 ENCOUNTER — Other Ambulatory Visit: Payer: Self-pay | Admitting: Internal Medicine

## 2021-02-18 NOTE — Progress Notes (Signed)
   I, Wendy Poet, LAT, ATC, am serving as scribe for Dr. Lynne Leader.  Debbie Marshall is a 75 y.o. female who presents to [place] [facility] today for f/u of B post shoulder pain, R>L, thought to be mainly due to bursitis.  She was last seen by Dr. Georgina Snell on 08/31/20 and was referred to PT but never completed any sessions.  She was also advised to use Voltaren gel.  Today, pt reports that she was not able to attend PT due to her schedule and having to take care of her father.  She locates her pain to her R ant and lateral shoulder w/ radiating pain into her R upper arm.  She does report some intermittent mechanical symptoms.  Her most aggravating activity is R shoulder functional IR.  Diagnostic testing: R shoulder XR- 08/31/20  Pertinent review of systems: No fevers or chills  Relevant historical information: Diabetes   Exam:  BP 122/78 (BP Location: Left Arm, Patient Position: Sitting, Cuff Size: Normal)   Pulse 67   Ht 4\' 11"  (1.499 m)   Wt 120 lb 6.4 oz (54.6 kg)   SpO2 97%   BMI 24.32 kg/m  General: Well Developed, well nourished, and in no acute distress.   MSK: Right shoulder normal. Nontender. Pain with abduction and internal rotation. Positive Hawkins and Neer's test. Intact strength.    Lab and Radiology Results EXAM: RIGHT SHOULDER - 2+ VIEW   COMPARISON:  None.   FINDINGS: There is no evidence of fracture or dislocation. There is no evidence of arthropathy or other focal bone abnormality. Soft tissues are unremarkable.   IMPRESSION: Negative.     Electronically Signed   By: Miachel Roux M.D.   On: 09/02/2020 10:26   I, Lynne Leader, personally (independently) visualized and performed the interpretation of the images attached in this note.    Assessment and Plan: 75 y.o. female with right shoulder pain.  Pain due to subacromial bursitis.  Patient was unable to do physical therapy after the last visit as she was out of town.  She is  able to do PT now and would like to try physical therapy before proceeding with more interventions such as injection.  Normal plan for PT and recheck in about 6 weeks.  If not improved consider injection or MRI.   PDMP not reviewed this encounter. Orders Placed This Encounter  Procedures   Ambulatory referral to Physical Therapy    Referral Priority:   Routine    Referral Type:   Physical Medicine    Referral Reason:   Specialty Services Required    Requested Specialty:   Physical Therapy    Number of Visits Requested:   1   No orders of the defined types were placed in this encounter.    Discussed warning signs or symptoms. Please see discharge instructions. Patient expresses understanding.   The above documentation has been reviewed and is accurate and complete Lynne Leader, M.D.

## 2021-02-19 ENCOUNTER — Ambulatory Visit: Payer: Self-pay

## 2021-02-19 ENCOUNTER — Other Ambulatory Visit: Payer: Self-pay

## 2021-02-19 ENCOUNTER — Ambulatory Visit: Payer: Medicare Other | Admitting: Family Medicine

## 2021-02-19 ENCOUNTER — Encounter: Payer: Self-pay | Admitting: Family Medicine

## 2021-02-19 VITALS — BP 122/78 | HR 67 | Ht 59.0 in | Wt 120.4 lb

## 2021-02-19 DIAGNOSIS — G8929 Other chronic pain: Secondary | ICD-10-CM | POA: Diagnosis not present

## 2021-02-19 DIAGNOSIS — M25511 Pain in right shoulder: Secondary | ICD-10-CM

## 2021-02-19 NOTE — Patient Instructions (Addendum)
Good to see you today.  I've referred you to Physical Therapy.  Please let us know if you don't hear from them in one week regarding scheduling.  Follow-up: 6 weeks

## 2021-03-01 ENCOUNTER — Ambulatory Visit (INDEPENDENT_AMBULATORY_CARE_PROVIDER_SITE_OTHER): Payer: Medicare Other

## 2021-03-01 ENCOUNTER — Other Ambulatory Visit (INDEPENDENT_AMBULATORY_CARE_PROVIDER_SITE_OTHER): Payer: Medicare Other

## 2021-03-01 DIAGNOSIS — R634 Abnormal weight loss: Secondary | ICD-10-CM

## 2021-03-01 DIAGNOSIS — E118 Type 2 diabetes mellitus with unspecified complications: Secondary | ICD-10-CM | POA: Diagnosis not present

## 2021-03-01 DIAGNOSIS — E785 Hyperlipidemia, unspecified: Secondary | ICD-10-CM | POA: Diagnosis not present

## 2021-03-01 DIAGNOSIS — Z23 Encounter for immunization: Secondary | ICD-10-CM | POA: Diagnosis not present

## 2021-03-01 DIAGNOSIS — R200 Anesthesia of skin: Secondary | ICD-10-CM | POA: Diagnosis not present

## 2021-03-01 DIAGNOSIS — E039 Hypothyroidism, unspecified: Secondary | ICD-10-CM | POA: Diagnosis not present

## 2021-03-01 DIAGNOSIS — Z79899 Other long term (current) drug therapy: Secondary | ICD-10-CM

## 2021-03-01 LAB — LIPID PANEL
Cholesterol: 234 mg/dL — ABNORMAL HIGH (ref 0–200)
HDL: 62.8 mg/dL (ref >39.00)
LDL Cholesterol: 145 mg/dL — ABNORMAL HIGH (ref 0–99)
NonHDL: 171.47
Total CHOL/HDL Ratio: 4
Triglycerides: 131 mg/dL (ref 0.0–149.0)
VLDL: 26.2 mg/dL (ref 0.0–40.0)

## 2021-03-01 LAB — BASIC METABOLIC PANEL
BUN: 21 mg/dL (ref 6–23)
CO2: 29 mEq/L (ref 19–32)
Calcium: 9.5 mg/dL (ref 8.4–10.5)
Chloride: 104 mEq/L (ref 96–112)
Creatinine, Ser: 0.73 mg/dL (ref 0.40–1.20)
GFR: 81.41 mL/min (ref >60.00)
Glucose, Bld: 116 mg/dL — ABNORMAL HIGH (ref 70–99)
Potassium: 4.2 mEq/L (ref 3.5–5.1)
Sodium: 140 mEq/L (ref 135–145)

## 2021-03-01 LAB — CBC WITH DIFFERENTIAL/PLATELET
Basophils Absolute: 0 10*3/uL (ref 0.0–0.1)
Basophils Relative: 0.2 % (ref 0.0–3.0)
Eosinophils Absolute: 0.2 10*3/uL (ref 0.0–0.7)
Eosinophils Relative: 4 % (ref 0.0–5.0)
HCT: 42.8 % (ref 36.0–46.0)
Hemoglobin: 14.2 g/dL (ref 12.0–15.0)
Lymphocytes Relative: 41.8 % (ref 12.0–46.0)
Lymphs Abs: 2 10*3/uL (ref 0.7–4.0)
MCHC: 33.2 g/dL (ref 30.0–36.0)
MCV: 91.1 fl (ref 78.0–100.0)
Monocytes Absolute: 0.3 10*3/uL (ref 0.1–1.0)
Monocytes Relative: 6.1 % (ref 3.0–12.0)
Neutro Abs: 2.3 10*3/uL (ref 1.4–7.7)
Neutrophils Relative %: 47.9 % (ref 43.0–77.0)
Platelets: 222 10*3/uL (ref 150.0–400.0)
RBC: 4.7 Mil/uL (ref 3.87–5.11)
RDW: 13.8 % (ref 11.5–15.5)
WBC: 4.8 10*3/uL (ref 4.0–10.5)

## 2021-03-01 LAB — MICROALBUMIN / CREATININE URINE RATIO
Creatinine,U: 81.6 mg/dL
Microalb Creat Ratio: 0.9 mg/g (ref 0.0–30.0)
Microalb, Ur: <0.7 mg/dL (ref 0.0–1.9)

## 2021-03-01 LAB — HEPATIC FUNCTION PANEL
ALT: 14 U/L (ref 0–35)
AST: 19 U/L (ref 0–37)
Albumin: 4.5 g/dL (ref 3.5–5.2)
Alkaline Phosphatase: 59 U/L (ref 39–117)
Bilirubin, Direct: 0.1 mg/dL (ref 0.0–0.3)
Total Bilirubin: 0.6 mg/dL (ref 0.2–1.2)
Total Protein: 7 g/dL (ref 6.0–8.3)

## 2021-03-01 LAB — HEMOGLOBIN A1C: Hgb A1c MFr Bld: 6.3 % (ref 4.6–6.5)

## 2021-03-01 LAB — C-REACTIVE PROTEIN: CRP: <1 mg/dL (ref 0.5–20.0)

## 2021-03-01 LAB — SEDIMENTATION RATE: Sed Rate: 5 mm/hr (ref 0–30)

## 2021-03-01 LAB — TSH: TSH: 10.64 u[IU]/mL — ABNORMAL HIGH (ref 0.35–5.50)

## 2021-03-01 NOTE — Progress Notes (Signed)
Diabetes findings stable cholesterol up from baseline on medicine thyroid is now more abnormal Will need dosage adjustment or assessment.  Make sure not taking biotin supplement and will discuss at upcoming visit.

## 2021-03-07 NOTE — Progress Notes (Signed)
Chief Complaint  Patient presents with   Annual Exam     HPI: Debbie Marshall 75 y.o. comes in today for Preventive Medicare exam/ wellness visit .Since last visit. Had a period of time where she went off her diabetic diet eating more right rice [place] food for the last 3 weeks before laboratory tests were done had not taken her thyroid medicine and cholesterol medicine.  Asks about seaweed iodine and her thyroid.  DM as noted has not been eating as healthy recently but plans to get back on that  New complaint legs cramping could be bilateral may be right greater than left no rhyme or reason number of times throughout the year is very painful stretches out massages and it gets better. ENT no more sinusitis at this point her ENT has retired   Stage manager Date Due   Zoster Vaccines- Shingrix (1 of 2) Never done   COVID-19 Vaccine (4 - Booster for Pfizer series) 05/06/2020   OPHTHALMOLOGY EXAM  07/29/2021   HEMOGLOBIN A1C  08/29/2021   URINE MICROALBUMIN  03/01/2022   FOOT EXAM  03/08/2022   MAMMOGRAM  07/08/2022   TETANUS/TDAP  06/13/2023   COLONOSCOPY (Pts 45-3yr Insurance coverage will need to be confirmed)  09/23/2023   Pneumonia Vaccine 75 Years old  Completed   INFLUENZA VACCINE  Completed   DEXA SCAN  Completed   Hepatitis C Screening  Completed   HPV VACCINES  Aged Out   Health Maintenance Review LIFESTYLE:  Exercise:   Tobacco/ETS: n Alcohol:   n rare Sugar beverages:not much Sleep:5-7 hours   wake 2 x   shoulder pain   to get PT   now.   Tylenol  Drug use: no HH:  2 father almost 115years old   Hearing:   ok  Vision:  No limitations at present . Last eye check UTD Safety:  Has smoke detector and wears seat belts.   No excess sun exposure. Sees dentist regularly. Memory: Felt to be good  , no concern from her or her family. Depression: No anhedonia unusual crying or depressive symptoms Nutrition: Eats well balanced diet; adequate calcium and  vitamin D. No swallowing chewing problems. Injury: no major injuries in the last six months. Other healthcare providers:  Reviewed today . Preventive parameters: up-to-date  Reviewed  ADLS:   There are no problems or need for assistance  driving, feeding, obtaining food, dressing, toileting and bathing, managing money using phone. She is independent.  ROS:  REST of 12 system review negative except as per HPI   Past Medical History:  Diagnosis Date   ABNORMAL EXAM-BILIARY TRACT 11/14/2008   ABNORMAL TRANSAMINASE, (LFT'S) 11/14/2008    2005 [place] fatty liver   Allergy    [place]   tongue cancer   Diabetes mellitus without complication ([place]    FASTING HYPERGLYCEMIA 09/07/2006   GERD 09/07/2006   HYPERLIPIDEMIA 09/07/2006   HYPOTHYROIDISM 10/30/2006   Nonspecific abnormal results of liver function study 09/07/2006   Normal nuclear stress test 2005   stress cardiolite    OSTEOARTHRITIS 09/07/2006   Positive PPD    received vaccine in home country- shows positive after receiving   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  Dr nLucia Gaskins  VITAMIN D DEFICIENCY 08/14/2009    Family History  Problem Relation Age of Onset   Stroke Mother 620  Arthritis Mother    Rheum arthritis Brother  Arthritis Brother    Gout Brother    Colon cancer Other    Esophageal cancer Neg Hx    Stomach cancer Neg Hx    Rectal cancer Neg Hx    Colon polyps Neg Hx     Social History   Socioeconomic History   Marital status: Single    Spouse name: Not on file   Number of children: Not on file   Years of education: Not on file   Highest education level: Not on file  Occupational History   Occupation: PhD Professor  Tobacco Use   Smoking status: Never   Smokeless tobacco: Never  Vaping Use   Vaping Use: Never used  Substance and Sexual Activity   Alcohol use: Yes    Alcohol/week: 1.0 standard drink    Types: 1 Glasses of wine per week    Comment: rarely   Drug use: No    Sexual activity: Not on file  Other Topics Concern   Not on file  Social History Narrative   HH of 2 Father and her    cat     PhD professor in information systems    No tobacco or alcohol exercising regularly currently   Social Determinants of Health   Financial Resource Strain: Low Risk    Difficulty of Paying Living Expenses: Not hard at all  Food Insecurity: No Food Insecurity   Worried About Charity fundraiser in the Last Year: Never true   Arboriculturist in the Last Year: Never true  Transportation Needs: Not on file  Physical Activity: Sufficiently Active   Days of Exercise per Week: 4 days   Minutes of Exercise per Session: 40 min  Stress: No Stress Concern Present   Feeling of Stress : Not at all  Social Connections: Moderately Isolated   Frequency of Communication with Friends and Family: More than three times a week   Frequency of Social Gatherings with Friends and Family: More than three times a week   Attends Religious Services: More than 4 times per year   Active Member of [employer] or Organizations: No   Attends Archivist Meetings: Never   Marital Status: Never married    Outpatient Encounter Medications as of 03/08/2021  Medication Sig   ACCU-CHEK GUIDE test strip TEST TWICE DAILY   Accu-Chek Softclix Lancets lancets TEST TWICE DAILY AS  DIRECTED   Ascorbic Acid (VITAMIN C) 500 MG CAPS Take 1 capsule by mouth daily.   Blood Glucose Monitoring Suppl (ACCU-CHEK GUIDE) w/Device KIT USE TO CHECK BLOOD SUGAR  TWICE DAILY   Cholecalciferol 1000 UNITS capsule Take 1 capsule (1,000 Units total) by mouth daily.   cyanocobalamin 2000 MCG tablet Take 2,500 mcg by mouth daily.   fluticasone (FLONASE) 50 MCG/ACT nasal spray Place 2 sprays into both nostrils daily.   levothyroxine (SYNTHROID) 50 MCG tablet TAKE 1 TABLET BY MOUTH  DAILY   LUTEIN PO Take 40 mg by mouth daily.   metFORMIN (GLUCOPHAGE-XR) 500 MG 24 hr tablet TAKE 2 TABLETS BY MOUTH  DAILY   MULTIPLE  VITAMIN PO Take 1 capsule by mouth daily.   Omega-3 Fatty Acids (FISH OIL) 435 MG CAPS Take 1 capsule by mouth in the morning and at bedtime.   simvastatin (ZOCOR) 40 MG tablet TAKE 1 TABLET BY MOUTH AT  BEDTIME   [DISCONTINUED] Lancet Devices (ACCU-CHEK SOFTCLIX) lancets Test twice daily.   No facility-administered encounter medications on file as of 03/08/2021.    EXAM:  BP 126/70 (BP Location: Left Arm, Patient Position: Sitting, Cuff Size: Normal)   Pulse 69   Temp 98.3 F (36.8 C) (Oral)   Ht 4' 11" (1.499 m)   Wt 119 lb 3.2 oz (54.1 kg)   SpO2 95%   BMI 24.08 kg/m   Body mass index is 24.08 kg/m.  Physical Exam: Vital signs reviewed GYK:ZLDJ is a well-developed well-nourished alert cooperative   who appears stated age in no acute distress.  HEENT: normocephalic atraumatic , Eyes: PERRL EOM's full, conjunctiva clear, Nares: paten,t no deformity discharge or tenderness., Ears: no deformity EAC's clear TMs with normal landmarks. Mouth:masked  NECK: supple without masses, r bruits. CHEST/PULM:  Clear to auscultation and percussion breath sounds equal no wheeze , rales or rhonchi. No chest wall deformities or tenderness. CV: PMI is nondisplaced, S1 S2 no gallops, murmurs, rubs. Peripheral pulses are full without delay.No JVD .  Breast no nodules or discharge ABDOMEN: Bowel sounds normal nontender  No guard or rebound, no hepato splenomegal no CVA tenderness.   Extremtities:  No clubbing cyanosis or edema, no acute joint swelling or redness no focal atrophy NEURO:  Oriented x3, cranial nerves 3-12 appear to be intact, no obvious focal weakness,gait within normal limits no abnormal reflexes or asymmetrical SKIN: No acute rashes normal turgor, color, no bruising or petechiae. PSYCH: Oriented, good eye contact, no obvious depression anxiety, cognition and judgment appear normal. LN: no cervical axillary inguinal adenopathy No noted deficits in memory, attention, and speech. Feet  exam normal decree sensation right lateral little toe otherwise normal Diabetic Foot Exam - Simple   Simple Foot Form Diabetic Foot exam was performed with the following findings: Yes 03/08/2021 10:36 AM  Visual Inspection No deformities, no ulcerations, no other skin breakdown bilaterally: Yes Sensation Testing Intact to touch and monofilament testing bilaterally: Yes See comments: Yes Pulse Check Posterior Tibialis and Dorsalis pulse intact bilaterally: Yes Comments Decreased right pinky distal lateral foot     Lab Results  Component Value Date   WBC 4.8 03/01/2021   HGB 14.2 03/01/2021   HCT 42.8 03/01/2021   PLT 222.0 03/01/2021   GLUCOSE 116 (H) 03/01/2021   CHOL 234 (H) 03/01/2021   TRIG 131.0 03/01/2021   HDL 62.80 03/01/2021   LDLDIRECT 163.6 10/23/2006   LDLCALC 145 (H) 03/01/2021   ALT 14 03/01/2021   AST 19 03/01/2021   NA 140 03/01/2021   K 4.2 03/01/2021   CL 104 03/01/2021   CREATININE 0.73 03/01/2021   BUN 21 03/01/2021   CO2 29 03/01/2021   TSH 10.64 (H) 03/01/2021   INR 1.0 ratio 11/14/2008   HGBA1C 6.3 03/01/2021   MICROALBUR <0.7 03/01/2021    ASSESSMENT AND PLAN:  Discussed the following assessment and plan:  Visit for preventive health examination  Hyperlipidemia, unspecified hyperlipidemia type - Plan: TSH, T4, free, Hemoglobin A1c, Magnesium, Lipid panel  Hypothyroidism, unspecified type - tsh rose off medication - Plan: TSH, T4, free, Hemoglobin A1c, Magnesium, Lipid panel  Low serum vitamin B12  Controlled type 2 diabetes mellitus with complication, without long-term current use of insulin (HCC) - Plan: TSH, T4, free, Hemoglobin A1c, Magnesium, Lipid panel  Medication management - Plan: TSH, T4, free, Hemoglobin A1c, Magnesium, Lipid panel  Leg cramps - Plan: TSH, T4, free, Hemoglobin A1c, Magnesium, Lipid panel So adherence to medicine has been down and as a trial she will get back on her thyroid medicine 6 days a week and statin  medicine.  Continue dietary control.  Would have her continue on the B12 that is been low in the past and decreased absorption with metformin can occur. In regard to her shoulder would proceed with physical therapy can also try adding Voltaren gel in addition to her Tylenol  Patient Care Team: Panosh, Standley Brooking, MD as PCP - General Earnie Larsson, [facility] as Pharmacist (Pharmacist)  Patient Instructions   Leg cramps common could be lot of reasons .   No obvious at [facility] time  .  But get back on thyroid medicine and b12  and  cholesterol medication.  Stay hydrated .   Plan lab   tsh , free t4 hg a1c,  magnesium   lipid panel in about 3-4 months   fasting  And then fu go from there .     Leg Cramps Leg cramps occur when one or more muscles tighten and a person has no control over it (involuntary muscle contraction). Muscle cramps are most common in the calf muscles of the leg. They can occur during exercise or at rest. Leg cramps are painful, and they may last for a few seconds to a few minutes. Cramps may return several times before they finally stop. Usually, leg cramps are not caused by a serious medical problem. In many cases, the cause is not known. Some common causes include: Excessive physical effort (overexertion), such as during intense exercise. Doing the same motion over and over. Staying in a certain position for a long period of time. Improper preparation, form, or technique while doing a sport or an activity. Dehydration. Injury. Side effects of certain medicines. Abnormally low levels of minerals in your blood (electrolytes), especially potassium and calcium. This could result from: Pregnancy. Taking diuretic medicines. Follow these instructions at home: Eating and drinking Drink enough fluid to keep your urine pale yellow. Staying hydrated may help prevent cramps. Eat a healthy diet that includes plenty of nutrients to help your muscles function. A healthy diet includes  fruits and vegetables, lean protein, whole grains, and low-fat or nonfat dairy products. Managing pain, stiffness, and swelling   Try massaging, stretching, and relaxing the affected muscle. Do this for several minutes at a time. If directed, put ice on areas that are sore or painful after a cramp. To do this: Put ice in a plastic bag. Place a towel between your skin and the bag. Leave the ice on for 20 minutes, 2-3 times a day. Remove the ice if your skin turns bright red. This is very important. If you cannot feel pain, heat, or cold, you have a greater risk of damage to the area. If directed, apply heat to muscles that are tense or tight. Do this before you exercise, or as often as told by your health care provider. Use the heat source that your health care provider recommends, such as a moist heat pack or a heating pad. To do this: Place a towel between your skin and the heat source. Leave the heat on for 20-30 minutes. Remove the heat if your skin turns bright red. This is especially important if you are unable to feel pain, heat, or cold. You may have a greater risk of getting burned. Try taking hot showers or baths to help relax tight muscles. General instructions If you are having frequent leg cramps, avoid intense exercise for several days. Take over-the-counter and prescription medicines only as told by your health care provider. Keep all follow-up visits. This is important. Contact a health care provider if: Your leg cramps  get more severe or more frequent, or they do not improve over time. Your foot becomes cold, numb, or blue. Summary Muscle cramps can develop in any muscle, but the most common place is in the calf muscles of the leg. Leg cramps are painful, and they may last for a few seconds to a few minutes. Usually, leg cramps are not caused by a serious medical problem. Often, the cause is not known. Stay hydrated, and take over-the-counter and prescription medicines only  as told by your health care provider. This information is not intended to replace advice given to you by your health care provider. Make sure you discuss any questions you have with your health care provider. Document Revised: 08/14/2019 Document Reviewed: 08/14/2019 Elsevier Patient Education  2022 Clifton Wetzel Meester M.D.

## 2021-03-08 ENCOUNTER — Ambulatory Visit (INDEPENDENT_AMBULATORY_CARE_PROVIDER_SITE_OTHER): Payer: Medicare Other | Admitting: Internal Medicine

## 2021-03-08 ENCOUNTER — Encounter: Payer: Self-pay | Admitting: Internal Medicine

## 2021-03-08 VITALS — BP 126/70 | HR 69 | Temp 98.3°F | Ht 59.0 in | Wt 119.2 lb

## 2021-03-08 DIAGNOSIS — E785 Hyperlipidemia, unspecified: Secondary | ICD-10-CM | POA: Diagnosis not present

## 2021-03-08 DIAGNOSIS — Z0000 Encounter for general adult medical examination without abnormal findings: Secondary | ICD-10-CM

## 2021-03-08 DIAGNOSIS — Z79899 Other long term (current) drug therapy: Secondary | ICD-10-CM | POA: Diagnosis not present

## 2021-03-08 DIAGNOSIS — E538 Deficiency of other specified B group vitamins: Secondary | ICD-10-CM | POA: Diagnosis not present

## 2021-03-08 DIAGNOSIS — R252 Cramp and spasm: Secondary | ICD-10-CM | POA: Diagnosis not present

## 2021-03-08 DIAGNOSIS — E118 Type 2 diabetes mellitus with unspecified complications: Secondary | ICD-10-CM

## 2021-03-08 DIAGNOSIS — E039 Hypothyroidism, unspecified: Secondary | ICD-10-CM

## 2021-03-08 NOTE — Patient Instructions (Addendum)
Leg cramps common could be lot of reasons .   No obvious at [facility] time  .  But get back on thyroid medicine and b12  and  cholesterol medication.  Stay hydrated .   Plan lab   tsh , free t4 hg a1c,  magnesium   lipid panel in about 3-4 months   fasting  And then fu go from there .     Leg Cramps Leg cramps occur when one or more muscles tighten and a person has no control over it (involuntary muscle contraction). Muscle cramps are most common in the calf muscles of the leg. They can occur during exercise or at rest. Leg cramps are painful, and they may last for a few seconds to a few minutes. Cramps may return several times before they finally stop. Usually, leg cramps are not caused by a serious medical problem. In many cases, the cause is not known. Some common causes include: Excessive physical effort (overexertion), such as during intense exercise. Doing the same motion over and over. Staying in a certain position for a long period of time. Improper preparation, form, or technique while doing a sport or an activity. Dehydration. Injury. Side effects of certain medicines. Abnormally low levels of minerals in your blood (electrolytes), especially potassium and calcium. This could result from: Pregnancy. Taking diuretic medicines. Follow these instructions at home: Eating and drinking Drink enough fluid to keep your urine pale yellow. Staying hydrated may help prevent cramps. Eat a healthy diet that includes plenty of nutrients to help your muscles function. A healthy diet includes fruits and vegetables, lean protein, whole grains, and low-fat or nonfat dairy products. Managing pain, stiffness, and swelling   Try massaging, stretching, and relaxing the affected muscle. Do this for several minutes at a time. If directed, put ice on areas that are sore or painful after a cramp. To do this: Put ice in a plastic bag. Place a towel between your skin and the bag. Leave the ice on for 20  minutes, 2-3 times a day. Remove the ice if your skin turns bright red. This is very important. If you cannot feel pain, heat, or cold, you have a greater risk of damage to the area. If directed, apply heat to muscles that are tense or tight. Do this before you exercise, or as often as told by your health care provider. Use the heat source that your health care provider recommends, such as a moist heat pack or a heating pad. To do this: Place a towel between your skin and the heat source. Leave the heat on for 20-30 minutes. Remove the heat if your skin turns bright red. This is especially important if you are unable to feel pain, heat, or cold. You may have a greater risk of getting burned. Try taking hot showers or baths to help relax tight muscles. General instructions If you are having frequent leg cramps, avoid intense exercise for several days. Take over-the-counter and prescription medicines only as told by your health care provider. Keep all follow-up visits. This is important. Contact a health care provider if: Your leg cramps get more severe or more frequent, or they do not improve over time. Your foot becomes cold, numb, or blue. Summary Muscle cramps can develop in any muscle, but the most common place is in the calf muscles of the leg. Leg cramps are painful, and they may last for a few seconds to a few minutes. Usually, leg cramps are not caused by a  serious medical problem. Often, the cause is not known. Stay hydrated, and take over-the-counter and prescription medicines only as told by your health care provider. This information is not intended to replace advice given to you by your health care provider. Make sure you discuss any questions you have with your health care provider. Document Revised: 08/14/2019 Document Reviewed: 08/14/2019 Elsevier Patient Education  [place].

## 2021-03-15 ENCOUNTER — Ambulatory Visit: Payer: Medicare Other

## 2021-03-16 NOTE — Therapy (Signed)
OUTPATIENT PHYSICAL THERAPY SHOULDER EVALUATION   Patient Name: Debbie Marshall MRN: [id number] DOB:May 27, 1945, 75 y.o., female Today's Date: 03/17/2021   PT End of Session - 03/17/21 0809     Visit Number 1    Number of Visits [place] Medicare    PT Start Time 0800    PT Stop Time 0845    PT Time Calculation (min) 45 min    Activity Tolerance Patient tolerated treatment well    Behavior During Therapy [facility] for tasks assessed/performed             Past Medical History:  Diagnosis Date   ABNORMAL EXAM-BILIARY TRACT 11/14/2008   ABNORMAL TRANSAMINASE, (LFT'S) 11/14/2008    2005 US shows fatty liver   Allergy    Cancer ([place]) 1996   tongue cancer   Diabetes mellitus without complication ([place])    FASTING HYPERGLYCEMIA 09/07/2006   GERD 09/07/2006   HYPERLIPIDEMIA 09/07/2006   HYPOTHYROIDISM 10/30/2006   Nonspecific abnormal results of liver function study 09/07/2006   Normal nuclear stress test 2005   stress cardiolite    OSTEOARTHRITIS 09/07/2006   Positive PPD    [place] vaccine [place]- shows positive after receiving   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  Dr Lucia Gaskins   VITAMIN D DEFICIENCY 08/14/2009   Past Surgical History:  Procedure Laterality Date   COLONOSCOPY     NASAL POLYP SURGERY  04/16/2020   POLYPECTOMY     SALIVARY GLAND SURGERY  1996   Tumor removed on back of tongue and salivary gland   Patient Active Problem List   Diagnosis Date Noted   Hip crepitus 02/27/2014   Visit for preventive health examination 10/28/2013   Nocturia 10/28/2013   Osteopenia 11/15/2012   AC (acromioclavicular) arthritis 11/10/2011   Cubital tunnel syndrome on right 10/14/2011   Arm pain, right 10/14/2011   Arthritis 10/05/2010   Physical exam, annual 06/02/2010   Diabetes mellitus type 2, uncomplicated ([place]) 93/71/6967   Positive PPD 06/02/2010   VITAMIN D DEFICIENCY 08/14/2009   ABNORMAL EXAM-BILIARY TRACT 11/14/2008   PAIN IN  JOINT, HAND 07/22/2008   Hypothyroidism 10/30/2006   Hyperlipidemia 09/07/2006   GERD 09/07/2006   OSTEOARTHRITIS 09/07/2006   FASTING HYPERGLYCEMIA 09/07/2006    PCP: Burnis Medin, MD  REFERRING PROVIDER: Gregor Hams, MD  REFERRING DIAG: [phone] (ICD-10-CM) - Chronic right shoulder pain  THERAPY DIAG:  Decreased right shoulder range of motion  Chronic right shoulder pain  Muscle weakness (generalized)   ONSET DATE: 06/2020  SUBJECTIVE:  SUBJECTIVE STATEMENT: Pt states the R shoulder pain is not constant. It only occurs with turning or moving. She states it started with small movements. Over time it became worse. At night she couldn't even sleep. She states that the pain feels like bone.  Pt denies a specific MOI. 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 She states that does not cause pain. Pt denies NT into fingers/hands. She sometimes feels that the shoulder doesn't have strength and could drop things. Pt denies pain in neck or neck motions causing neck pain. R handed. Pt states she hears/feels popping. Pt denies instability. Pt feels pain with HK position reaching across body. It is more stiff in the morning. Pt denies feeling stiff/stuck.   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 No VAS scale: Worst 7/10 Pain location: R shoulder  PAIN TYPE: aching Pain description: intermittent  Aggravating factors: reaching behind back, dressing, keyboarding Relieving factors: patches, topical ointments, tylenol   PRECAUTIONS: None  WEIGHT BEARING RESTRICTIONS No  FALLS:  Has patient fallen in last 6 months? No Number of falls: 0  LIVING ENVIRONMENT: Lives with: lives with their  family Lives in: House/apartment   PLOF: Independent  PATIENT GOALS : Pt states she would like to reach behind back normally without pain.   OBJECTIVE:   DIAGNOSTIC FINDINGS:  FINDINGS: There is no evidence of fracture or dislocation. There is no evidence of arthropathy or other focal bone abnormality. Soft tissues are unremarkable.   IMPRESSION: Negative.  Diagnostic Limited MSK Ultrasound of: Right shoulder Biceps tendon is intact in bicipital groove.  Small amount of hypoechoic fluid tracks within biceps tendon sheath. Subscapularis tendon is intact and normal appearing Supraspinatus tendon is intact. Mild subacromial bursitis is present. Infraspinatus tendon is intact. AC joint degenerative appearing Impression: Subacromial bursitis  PATIENT SURVEYS:  FOTO 55 63 D/C 5 pts MCII  COGNITION:  Overall cognitive status: Within functional limits for tasks assessed     SENSATION:  Light touch: Appears intact   UPPER EXTREMITY AROM/PROM:  C/S ROM WFL no recreation of pain with neck position  A/PROM Right 03/17/2021 Left 03/17/2021  Shoulder flexion 120 p! 150  Shoulder extension 30 45  Shoulder abduction 110 p! 150  Shoulder adduction    Shoulder internal rotation BHB reach to glute p! WFL reach  Shoulder external rotation 40 55  (Blank rows = not tested)  UPPER EXTREMITY MMT:  MMT Right 03/17/2021 Left 03/17/2021  Shoulder flexion 4/5 4+/5  Shoulder extension    Shoulder abduction 4/5 4+/5  Shoulder adduction 4/5 4+/5  IR 4/5 4+/5  (Blank rows = not tested)  SHOULDER SPECIAL TESTS:  Impingement tests: Neer impingement test: positive , Hawkins/Kennedy impingement test: positive , and Painful arc test: positive   SLAP lesions: Biceps load test: negative  Rotator cuff assessment: Drop arm test: negative, Empty can test: negative, and Belly press test: negative  Biceps assessment: Speed's test: positive   JOINT MOBILITY TESTING:  R GHJ stiffness into all  directions   PALPATION:  WNL no signficant hypertonicity of TTP noted in periscapular region  POSTURE:  Slight fwd shoulder, UT compensation with all movements   TODAY'S TREATMENT:   Exercises Seated Shoulder Flexion Towel Slide at Table Top - 2 x daily - 7 x weekly - 1 sets - 10 reps - 5 hold Seated Shoulder External Rotation PROM on Table - 2 x daily -  7 x weekly - 1 sets - 10 reps - 5 hold Doorway Pec Stretch at 60 Degrees Abduction with Arm Straight - 2 x daily - 7 x weekly - 1 sets - 3 reps - 30 hold   PATIENT EDUCATION: Education details: MOI, diagnosis, prognosis, anatomy, exercise progression, DOMS expectations, muscle firing,  envelope of function, HEP, POC  Person educated: Patient Education method: Explanation, Demonstration, Tactile cues, Verbal cues, and Handouts Education comprehension: verbalized understanding, returned demonstration, verbal cues required, and tactile cues required   HOME EXERCISE PROGRAM: Access Code: [email or web address] URL: [email or web address] Date: 03/17/2021 Prepared by: Daleen Bo    ASSESSMENT:  CLINICAL IMPRESSION: Patient is a 75 y.o. female who was seen today for physical therapy evaluation and treatment for CC of R shoulder pain. Pt's s/s appear consistent with R RTC inflammation and potential adhesive capsulitis. Pt's R GHJ presents with increased joint stiffness as well as movement compensations with rotational and reaching movements. However, capsular pattern restriction not present. Skilled therapy will continue to assess general anterior shoulder pain vs adhesive cap. Pt age, sex, and history of DM are potential contributing factors to diff dx. Pt's pain is moderately sensitive and irritable with motion. Objective impairments include decreased ROM, decreased strength, hypomobility, increased muscle spasms, impaired flexibility, impaired UE functional use, improper body mechanics, postural dysfunction, and pain. These impairments  are limiting patient from cleaning, community activity, driving, meal prep, occupation, laundry, yard work, shopping, and caregiver duties . Personal factors including Age, Fitness, Sex, Time since onset of injury/illness/exacerbation, and 1-2 comorbidities:    are also affecting patient's functional outcome. Patient will benefit from skilled PT to address above impairments and improve overall function.  REHAB POTENTIAL: Good  CLINICAL DECISION MAKING: Stable/uncomplicated  EVALUATION COMPLEXITY: Low   GOALS:   SHORT TERM GOALS:  STG Name Target Date Goal status  1 Pt will become independent with HEP in order to demonstrate synthesis of PT education.  Baseline:  03/31/2021 INITIAL  2 Pt will report at least 2 pt reduction on NPRS scale for pain in order to demonstrate functional improvement with household activity, self care, and ADL.  Baseline:  04/28/2021 INITIAL  3 Pt will be able to demonstrate full R OH ROM without pain in order to demonstrate functional improvement in UE function for self-care and house hold duties.  Baseline: 04/28/2021 INITIAL   LONG TERM GOALS:   LTG Name Target Date Goal status  1 Pt  will become independent with final HEP in order to demonstrate synthesis of PT education.  Baseline: 06/09/2021 INITIAL  2 Pt will be able to reach [facility] and carry/hold >5 lbs in order to demonstrate functional improvement in L UE strength for return to PLOF and exercise.  Baseline: 06/09/2021 INITIAL  3 Pt will be able to demonstrate BHB IR reach without pain in order to demonstrate functional improvement in UE function for self-care and house hold duties.  06/09/2021 INITIAL  4 Pt will score >/= 63 on FOTO to demonstrate functional improvement in shoulder pain.  Baseline: 06/09/2021 INITIAL   PLAN: PT FREQUENCY: 1-2x/week  PT DURATION: 12 weeks (likely DC by 8 weeks)  PLANNED INTERVENTIONS: Therapeutic exercises, Therapeutic activity, Neuro Muscular re-education, Balance  training, Gait training, Patient/Family education, Joint mobilization, Aquatic Therapy, Dry Needling, Electrical stimulation, Spinal mobilization, Cryotherapy, Moist heat, Compression bandaging, scar mobilization, Taping, Vasopneumatic device, Traction, Ultrasound, Ionotophoresis 4mg /ml Dexamethasone, and Manual therapy  PLAN FOR NEXT SESSION: review HEP, joint mobilizations, STM, ER and rowing, wall slide  Antony Haste  Brynnleigh Mcelwee PT, DPT 03/17/21 10:48 AM

## 2021-03-17 ENCOUNTER — Other Ambulatory Visit: Payer: Self-pay

## 2021-03-17 ENCOUNTER — Ambulatory Visit (HOSPITAL_BASED_OUTPATIENT_CLINIC_OR_DEPARTMENT_OTHER): Payer: Medicare Other | Attending: Family Medicine | Admitting: Physical Therapy

## 2021-03-17 ENCOUNTER — Encounter (HOSPITAL_BASED_OUTPATIENT_CLINIC_OR_DEPARTMENT_OTHER): Payer: Self-pay | Admitting: Physical Therapy

## 2021-03-17 DIAGNOSIS — M6281 Muscle weakness (generalized): Secondary | ICD-10-CM | POA: Insufficient documentation

## 2021-03-17 DIAGNOSIS — M25611 Stiffness of right shoulder, not elsewhere classified: Secondary | ICD-10-CM | POA: Diagnosis not present

## 2021-03-17 DIAGNOSIS — G8929 Other chronic pain: Secondary | ICD-10-CM | POA: Insufficient documentation

## 2021-03-17 DIAGNOSIS — M25511 Pain in right shoulder: Secondary | ICD-10-CM | POA: Insufficient documentation

## 2021-03-23 ENCOUNTER — Other Ambulatory Visit: Payer: Self-pay

## 2021-03-23 ENCOUNTER — Encounter (HOSPITAL_BASED_OUTPATIENT_CLINIC_OR_DEPARTMENT_OTHER): Payer: Self-pay | Admitting: Physical Therapy

## 2021-03-23 ENCOUNTER — Ambulatory Visit (HOSPITAL_BASED_OUTPATIENT_CLINIC_OR_DEPARTMENT_OTHER): Payer: Medicare Other | Admitting: Physical Therapy

## 2021-03-23 DIAGNOSIS — M6281 Muscle weakness (generalized): Secondary | ICD-10-CM | POA: Diagnosis not present

## 2021-03-23 DIAGNOSIS — G8929 Other chronic pain: Secondary | ICD-10-CM | POA: Diagnosis not present

## 2021-03-23 DIAGNOSIS — M25611 Stiffness of right shoulder, not elsewhere classified: Secondary | ICD-10-CM

## 2021-03-23 DIAGNOSIS — M25511 Pain in right shoulder: Secondary | ICD-10-CM | POA: Diagnosis not present

## 2021-03-23 NOTE — Therapy (Signed)
OUTPATIENT PHYSICAL THERAPY TREATMENT   Patient Name: Debbie Marshall MRN: [id number] DOB:1946-02-09, 75 y.o., female Today's Date: 03/23/2021   PT End of Session - 03/23/21 1348     Visit Number 2    Number of Visits [place] Medicare    PT Start Time 4270    PT Stop Time 1430    PT Time Calculation (min) 44 min    Activity Tolerance Patient tolerated treatment well    Behavior During Therapy WFL for tasks assessed/performed              Past Medical History:  Diagnosis Date   ABNORMAL EXAM-BILIARY TRACT 11/14/2008   ABNORMAL TRANSAMINASE, (LFT'S) 11/14/2008    2005 US shows fatty liver   Allergy    Cancer ([place]) 1996   tongue cancer   Diabetes mellitus without complication ([place])    FASTING HYPERGLYCEMIA 09/07/2006   GERD 09/07/2006   HYPERLIPIDEMIA 09/07/2006   HYPOTHYROIDISM 10/30/2006   Nonspecific abnormal results of liver function study 09/07/2006   Normal nuclear stress test 2005   stress cardiolite    OSTEOARTHRITIS 09/07/2006   Positive PPD    [place] vaccine [place]- shows positive after receiving   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  Dr Lucia Gaskins   VITAMIN D DEFICIENCY 08/14/2009   Past Surgical History:  Procedure Laterality Date   COLONOSCOPY     NASAL POLYP SURGERY  04/16/2020   POLYPECTOMY     SALIVARY GLAND SURGERY  1996   Tumor removed on back of tongue and salivary gland   Patient Active Problem List   Diagnosis Date Noted   Hip crepitus 02/27/2014   Visit for preventive health examination 10/28/2013   Nocturia 10/28/2013   Osteopenia 11/15/2012   AC (acromioclavicular) arthritis 11/10/2011   Cubital tunnel syndrome on right 10/14/2011   Arm pain, right 10/14/2011   Arthritis 10/05/2010   Physical exam, annual 06/02/2010   Diabetes mellitus type 2, uncomplicated ([place]) 62/37/6283   Positive PPD 06/02/2010   VITAMIN D DEFICIENCY 08/14/2009   ABNORMAL EXAM-BILIARY TRACT 11/14/2008   PAIN IN JOINT, HAND  07/22/2008   Hypothyroidism 10/30/2006   Hyperlipidemia 09/07/2006   GERD 09/07/2006   OSTEOARTHRITIS 09/07/2006   FASTING HYPERGLYCEMIA 09/07/2006    PCP: Burnis Medin, MD  REFERRING PROVIDER: Burnis Medin, MD  REFERRING DIAG: [phone] (ICD-10-CM) - Chronic right shoulder pain  THERAPY DIAG:  Decreased right shoulder range of motion  Chronic right shoulder pain  Muscle weakness (generalized)   ONSET DATE: 06/2020  SUBJECTIVE:  SUBJECTIVE STATEMENT: Pt states that shoudler sometimes "gets better and gets worse." She is unsure of changes in ROM. Pt does report previous history of bilateral shoulders being stuck when she was in her 9's. They spontaneously resolved.   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 No VAS scale: Worst 7/10 Pain location: R shoulder  PAIN TYPE: aching Pain description: intermittent  Aggravating factors: reaching behind back, dressing, keyboarding Relieving factors: patches, topical ointments, tylenol   PRECAUTIONS: None  PATIENT GOALS : Pt states she would like to reach behind back normally without pain.   OBJECTIVE:   DIAGNOSTIC FINDINGS:  FINDINGS: There is no evidence of fracture or dislocation. There is no evidence of arthropathy or other focal bone abnormality. Soft tissues are unremarkable.   IMPRESSION: Negative.  Diagnostic Limited MSK Ultrasound of: Right shoulder Biceps tendon is intact in bicipital groove.  Small amount of hypoechoic fluid tracks within biceps tendon sheath. Subscapularis tendon is intact and normal appearing Supraspinatus tendon is intact. Mild subacromial bursitis is present. Infraspinatus tendon is intact. AC joint degenerative  appearing Impression: Subacromial bursitis  PATIENT SURVEYS:  FOTO 55 63 D/C 5 pts MCII   No capsular pattern at 90 deg ABD; limited in ER at 0, then ABD, and IR at 90; stiffness into R GHJ with pain and end range; firm end feel   TODAY'S TREATMENT:   Joint mobs: Inf and post R GHJ grade III in open pack  Exercises Supine AAROM flexion self 10s 10x Seated Shoulder Flexion Towel Slide at Table Top -10x 5s Seated Shoulder External Rotation PROM on Table  5s 10x Doorway Pec Stretch at 60 Degrees Abduction with Arm Straight - 15s 4x Seated dowel ER in seated 5s 10x   ER [place]om   PATIENT EDUCATION: Education details: anatomy, exercise progression, DOMS expectations, muscle firing,  envelope of function, HEP, POC  Person educated: Patient Education method: Explanation, Demonstration, Tactile cues, Verbal cues, and Handouts Education comprehension: verbalized understanding, returned demonstration, verbal cues required, and tactile cues required   HOME EXERCISE PROGRAM: Access Code: [email or web address] URL: [email or web address] Date: 03/23/2021 Prepared by: Daleen Bo  Exercises Seated Shoulder Flexion Towel Slide at Table Top - 2 x daily - 7 x weekly - 1 sets - 10 reps - 5 hold Seated Shoulder External Rotation PROM on Table - 2 x daily - 7 x weekly - 1 sets - 10 reps - 5 hold Seated Shoulder External Rotation AAROM with Cane and Hand in Neutral - 2 x daily - 7 x weekly - 1 sets - 10 reps - 5 hold Standing Isometric Shoulder External Rotation with Doorway - 2 x daily - 7 x weekly - 1 sets - 10 reps - 5 hold      ASSESSMENT:  CLINICAL IMPRESSION: Pt continues to demonstrate R shoulder joint stiffness that appear consistent with adhesive capsulitis. Given pt's report of spontaneous freezing and unfreezing of bilateral shoulders in her 36s, pt does seem to have multiple contributing factors. However, pt with good tolerance to gentle mobility exercise at today's session. Pt  with moderately irritable pain at end range but ROM and positioning could be modified to reduce irritation. HEP updated and plan to continue with ROM and R shoulder cuff relaxation techniques as tolerated.   Objective impairments include decreased ROM, decreased strength, hypomobility, increased muscle spasms, impaired flexibility, impaired UE functional use, improper body mechanics, postural dysfunction, and pain. These impairments are limiting patient from  cleaning, community activity, driving, meal prep, occupation, Medical sales representative, yard work, shopping, and caregiver duties . Personal factors including Age, Fitness, Sex, Time since onset of injury/illness/exacerbation, and 1-2 comorbidities:    are also affecting patient's functional outcome. Patient will benefit from skilled PT to address above impairments and improve overall function.  REHAB POTENTIAL: Good  CLINICAL DECISION MAKING: Stable/uncomplicated  EVALUATION COMPLEXITY: Low   GOALS:   SHORT TERM GOALS:  STG Name Target Date Goal status  1 Pt will become independent with HEP in order to demonstrate synthesis of PT education.  Baseline:  04/06/2021 INITIAL  2 Pt will report at least 2 pt reduction on NPRS scale for pain in order to demonstrate functional improvement with household activity, self care, and ADL.  Baseline:  05/04/2021 INITIAL  3 Pt will be able to demonstrate full R OH ROM without pain in order to demonstrate functional improvement in UE function for self-care and house hold duties.  Baseline: 05/04/2021 INITIAL   LONG TERM GOALS:   LTG Name Target Date Goal status  1 Pt  will become independent with final HEP in order to demonstrate synthesis of PT education.  Baseline: 06/15/2021 INITIAL  2 Pt will be able to reach [facility] and carry/hold >5 lbs in order to demonstrate functional improvement in L UE strength for return to PLOF and exercise.  Baseline: 06/15/2021 INITIAL  3 Pt will be able to demonstrate BHB IR reach without  pain in order to demonstrate functional improvement in UE function for self-care and house hold duties.  06/15/2021 INITIAL  4 Pt will score >/= 63 on FOTO to demonstrate functional improvement in shoulder pain.  Baseline: 06/15/2021 INITIAL   PLAN: PT FREQUENCY: 1-2x/week  PT DURATION: 12 weeks (likely DC by 8 weeks)  PLANNED INTERVENTIONS: Therapeutic exercises, Therapeutic activity, Neuro Muscular re-education, Balance training, Gait training, Patient/Family education, Joint mobilization, Aquatic Therapy, Dry Needling, Electrical stimulation, Spinal mobilization, Cryotherapy, Moist heat, Compression bandaging, scar mobilization, Taping, Vasopneumatic device, Traction, Ultrasound, Ionotophoresis 4mg /ml Dexamethasone, and Manual therapy  PLAN FOR NEXT SESSION: review HEP, joint mobilizations, STM, ER and rowing, wall slide  Daleen Bo PT, DPT 03/23/21 2:43 PM

## 2021-03-25 ENCOUNTER — Other Ambulatory Visit: Payer: Self-pay

## 2021-03-25 ENCOUNTER — Encounter (HOSPITAL_BASED_OUTPATIENT_CLINIC_OR_DEPARTMENT_OTHER): Payer: Self-pay | Admitting: Physical Therapy

## 2021-03-25 ENCOUNTER — Ambulatory Visit (HOSPITAL_BASED_OUTPATIENT_CLINIC_OR_DEPARTMENT_OTHER): Payer: Medicare Other | Admitting: Physical Therapy

## 2021-03-25 DIAGNOSIS — M25611 Stiffness of right shoulder, not elsewhere classified: Secondary | ICD-10-CM

## 2021-03-25 DIAGNOSIS — M6281 Muscle weakness (generalized): Secondary | ICD-10-CM

## 2021-03-25 DIAGNOSIS — M25511 Pain in right shoulder: Secondary | ICD-10-CM | POA: Diagnosis not present

## 2021-03-25 DIAGNOSIS — G8929 Other chronic pain: Secondary | ICD-10-CM | POA: Diagnosis not present

## 2021-03-25 NOTE — Therapy (Signed)
OUTPATIENT PHYSICAL THERAPY TREATMENT   Patient Name: Debbie Marshall MRN: [id number] DOB:07-16-1945, 75 y.o., female Today's Date: 03/25/2021   PT End of Session - 03/25/21 1154     Visit Number 3    Number of Visits [facility]    PT Start Time 1150    PT Stop Time 1230    PT Time Calculation (min) 40 min    Activity Tolerance Patient tolerated treatment well    Behavior During Therapy WFL for tasks assessed/performed               Past Medical History:  Diagnosis Date   ABNORMAL EXAM-BILIARY TRACT 11/14/2008   ABNORMAL TRANSAMINASE, (LFT'S) 11/14/2008    2005 US shows fatty liver   Allergy    Cancer ([place]) 1996   tongue cancer   Diabetes mellitus without complication ([place])    FASTING HYPERGLYCEMIA 09/07/2006   GERD 09/07/2006   HYPERLIPIDEMIA 09/07/2006   HYPOTHYROIDISM 10/30/2006   Nonspecific abnormal results of liver function study 09/07/2006   Normal nuclear stress test 2005   stress cardiolite    OSTEOARTHRITIS 09/07/2006   Positive PPD    [place] vaccine [place]- shows positive after receiving   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  Dr Lucia Gaskins   VITAMIN D DEFICIENCY 08/14/2009   Past Surgical History:  Procedure Laterality Date   COLONOSCOPY     NASAL POLYP SURGERY  04/16/2020   POLYPECTOMY     SALIVARY GLAND SURGERY  1996   Tumor removed on back of tongue and salivary gland   Patient Active Problem List   Diagnosis Date Noted   Hip crepitus 02/27/2014   Visit for preventive health examination 10/28/2013   Nocturia 10/28/2013   Osteopenia 11/15/2012   AC (acromioclavicular) arthritis 11/10/2011   Cubital tunnel syndrome on right 10/14/2011   Arm pain, right 10/14/2011   Arthritis 10/05/2010   Physical exam, annual 06/02/2010   Diabetes mellitus type 2, uncomplicated ([place]) 33/61/2244   Positive PPD 06/02/2010   VITAMIN D DEFICIENCY 08/14/2009   ABNORMAL EXAM-BILIARY TRACT 11/14/2008   PAIN IN JOINT,  HAND 07/22/2008   Hypothyroidism 10/30/2006   Hyperlipidemia 09/07/2006   GERD 09/07/2006   OSTEOARTHRITIS 09/07/2006   FASTING HYPERGLYCEMIA 09/07/2006    PCP: Burnis Medin, MD  REFERRING PROVIDER: Burnis Medin, MD  REFERRING DIAG: [phone] (ICD-10-CM) - Chronic right shoulder pain  THERAPY DIAG:  Decreased right shoulder range of motion  Chronic right shoulder pain  Muscle weakness (generalized)   ONSET DATE: 06/2020  SUBJECTIVE:  SUBJECTIVE STATEMENT: Pt states sleeping on the shoulder is painful when sleeping. She states the pain seems slightly better than before.   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 No VAS scale: Worst 7/10 Pain location: R shoulder  PAIN TYPE: aching Pain description: intermittent  Aggravating factors: reaching behind back, dressing, keyboarding Relieving factors: patches, topical ointments, tylenol   PRECAUTIONS: None  PATIENT GOALS : Pt states she would like to reach behind back normally without pain.   OBJECTIVE:   DIAGNOSTIC FINDINGS:  FINDINGS: There is no evidence of fracture or dislocation. There is no evidence of arthropathy or other focal bone abnormality. Soft tissues are unremarkable.   IMPRESSION: Negative.  Diagnostic Limited MSK Ultrasound of: Right shoulder Biceps tendon is intact in bicipital groove.  Small amount of hypoechoic fluid tracks within biceps tendon sheath. Subscapularis tendon is intact and normal appearing Supraspinatus tendon is intact. Mild subacromial bursitis is present. Infraspinatus tendon is intact. AC joint degenerative appearing Impression: Subacromial bursitis  PATIENT SURVEYS:  FOTO 55 63 D/C 5 pts MCII    TODAY'S  TREATMENT:   Joint mobs: Inf and post R GHJ grade III-IV in open pack  Exercises Supine AAROM flexion self 10s 10x Pulley's flexion and ABD 2 min each Doorway Pec Stretch at 60 Degrees Abduction with Arm Straight - 15s 4x Seated dowel ER in seated 5s 10x   ER Isometric 5s 10x   Rowing RTB 20x    PATIENT EDUCATION: Education details: anatomy, exercise progression, DOMS expectations, muscle firing,  envelope of function, HEP, POC  Person educated: Patient Education method: Explanation, Demonstration, Tactile cues, Verbal cues, and Handouts Education comprehension: verbalized understanding, returned demonstration, verbal cues required, and tactile cues required   HOME EXERCISE PROGRAM: Access Code: [email or web address] URL: [email or web address] Date: 03/23/2021 Prepared by: Daleen Bo  Exercises Seated Shoulder Flexion Towel Slide at Table Top - 2 x daily - 7 x weekly - 1 sets - 10 reps - 5 hold Seated Shoulder External Rotation PROM on Table - 2 x daily - 7 x weekly - 1 sets - 10 reps - 5 hold Seated Shoulder External Rotation AAROM with Cane and Hand in Neutral - 2 x daily - 7 x weekly - 1 sets - 10 reps - 5 hold Standing Isometric Shoulder External Rotation with Doorway - 2 x daily - 7 x weekly - 1 sets - 10 reps - 5 hold      ASSESSMENT:  CLINICAL IMPRESSION: Pt  with improvement in flexion and ABD at today's session following manual and exercise. Pt able to reach PROM OH in ABD to 150 without pain. Pt does appear to be improving from previous session with AROM and PROM. Plan to continue with cuff relaxation and mobility as tolerated. Plan to updated HEP at next session.   Objective impairments include decreased ROM, decreased strength, hypomobility, increased muscle spasms, impaired flexibility, impaired UE functional use, improper body mechanics, postural dysfunction, and pain. These impairments are limiting patient from cleaning, community activity, driving, meal  prep, occupation, laundry, yard work, shopping, and caregiver duties . Personal factors including Age, Fitness, Sex, Time since onset of injury/illness/exacerbation, and 1-2 comorbidities:    are also affecting patient's functional outcome. Patient will benefit from skilled PT to address above impairments and improve overall function.  REHAB POTENTIAL: Good  CLINICAL DECISION MAKING: Stable/uncomplicated  EVALUATION COMPLEXITY: Low   GOALS:   SHORT TERM GOALS:  STG Name Target Date  Goal status  1 Pt will become independent with HEP in order to demonstrate synthesis of PT education.  Baseline:  04/08/2021 INITIAL  2 Pt will report at least 2 pt reduction on NPRS scale for pain in order to demonstrate functional improvement with household activity, self care, and ADL.  Baseline:  05/06/2021 INITIAL  3 Pt will be able to demonstrate full R OH ROM without pain in order to demonstrate functional improvement in UE function for self-care and house hold duties.  Baseline: 05/06/2021 INITIAL   LONG TERM GOALS:   LTG Name Target Date Goal status  1 Pt  will become independent with final HEP in order to demonstrate synthesis of PT education.  Baseline: 06/17/2021 INITIAL  2 Pt will be able to reach [facility] and carry/hold >5 lbs in order to demonstrate functional improvement in L UE strength for return to PLOF and exercise.  Baseline: 06/17/2021 INITIAL  3 Pt will be able to demonstrate BHB IR reach without pain in order to demonstrate functional improvement in UE function for self-care and house hold duties.  06/17/2021 INITIAL  4 Pt will score >/= 63 on FOTO to demonstrate functional improvement in shoulder pain.  Baseline: 06/17/2021 INITIAL   PLAN: PT FREQUENCY: 1-2x/week  PT DURATION: 12 weeks (likely DC by 8 weeks)  PLANNED INTERVENTIONS: Therapeutic exercises, Therapeutic activity, Neuro Muscular re-education, Balance training, Gait training, Patient/Family education, Joint mobilization,  Aquatic Therapy, Dry Needling, Electrical stimulation, Spinal mobilization, Cryotherapy, Moist heat, Compression bandaging, scar mobilization, Taping, Vasopneumatic device, Traction, Ultrasound, Ionotophoresis 4mg /ml Dexamethasone, and Manual therapy  PLAN FOR NEXT SESSION: review HEP, joint mobilizations, adding rowing to HEP, continue with pulleys, trial wall sliding or finger ladder  Daleen Bo PT, DPT 03/25/21 12:41 PM

## 2021-03-30 ENCOUNTER — Encounter (HOSPITAL_BASED_OUTPATIENT_CLINIC_OR_DEPARTMENT_OTHER): Payer: Medicare Other | Admitting: Physical Therapy

## 2021-03-31 NOTE — Therapy (Signed)
OUTPATIENT PHYSICAL THERAPY TREATMENT NOTE   Patient Name: Debbie Marshall MRN: [id number] DOB:26-Apr-1945, 75 y.o., female Today's Date: 04/01/2021  PCP: Burnis Medin, MD REFERRING PROVIDER: Burnis Medin, MD   PT End of Session - 04/01/21 1052     Visit Number 4    Number of Visits 22    Authorization Type [employer] Medicare    PT Start Time 2694    PT Stop Time 1056    PT Time Calculation (min) 38 min    Activity Tolerance Patient tolerated treatment well;No increased pain    Behavior During Therapy WFL for tasks assessed/performed             Past Medical History:  Diagnosis Date   ABNORMAL EXAM-BILIARY TRACT 11/14/2008   ABNORMAL TRANSAMINASE, (LFT'S) 11/14/2008    2005 US shows fatty liver   Allergy    Cancer ([place]) 1996   tongue cancer   Diabetes mellitus without complication ([place])    FASTING HYPERGLYCEMIA 09/07/2006   GERD 09/07/2006   HYPERLIPIDEMIA 09/07/2006   HYPOTHYROIDISM 10/30/2006   Nonspecific abnormal results of liver function study 09/07/2006   Normal nuclear stress test 2005   stress cardiolite    OSTEOARTHRITIS 09/07/2006   Positive PPD    [place] vaccine [place]- shows positive after receiving   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  Dr Lucia Gaskins   VITAMIN D DEFICIENCY 08/14/2009   Past Surgical History:  Procedure Laterality Date   COLONOSCOPY     NASAL POLYP SURGERY  04/16/2020   POLYPECTOMY     SALIVARY GLAND SURGERY  1996   Tumor removed on back of tongue and salivary gland   Patient Active Problem List   Diagnosis Date Noted   Hip crepitus 02/27/2014   Visit for preventive health examination 10/28/2013   Nocturia 10/28/2013   Osteopenia 11/15/2012   AC (acromioclavicular) arthritis 11/10/2011   Cubital tunnel syndrome on right 10/14/2011   Arm pain, right 10/14/2011   Arthritis 10/05/2010   Physical exam, annual 06/02/2010   Diabetes mellitus type 2, uncomplicated ([place]) 85/46/2703   Positive PPD 06/02/2010   VITAMIN  D DEFICIENCY 08/14/2009   ABNORMAL EXAM-BILIARY TRACT 11/14/2008   PAIN IN JOINT, HAND 07/22/2008   Hypothyroidism 10/30/2006   Hyperlipidemia 09/07/2006   GERD 09/07/2006   OSTEOARTHRITIS 09/07/2006   FASTING HYPERGLYCEMIA 09/07/2006      PCP: Burnis Medin, MD   REFERRING PROVIDER: Burnis Medin, MD   REFERRING DIAG: [phone] (ICD-10-CM) - Chronic right shoulder pain   THERAPY DIAG:  Decreased right shoulder range of motion   Chronic right shoulder pain   Muscle weakness (generalized)     ONSET DATE: 06/2020   SUBJECTIVE:  SUBJECTIVE STATEMENT: Pt states that she is not having any pain this morning. She did her HEP this morning and that usually helps. Sometimes she will have elbow pain and wants to know if this is normal.   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 No VAS scale: Worst 7/10 Pain location: R shoulder   PAIN TYPE: aching Pain description: intermittent  Aggravating factors: reaching behind back, dressing, keyboarding Relieving factors: patches, topical ointments, tylenol    PRECAUTIONS: None   PATIENT GOALS : Pt states she would like to reach behind back normally without pain.    OBJECTIVE:    DIAGNOSTIC FINDINGS:  FINDINGS: There is no evidence of fracture or dislocation. There is no evidence of arthropathy or other focal bone abnormality. Soft tissues are unremarkable.   IMPRESSION: Negative.   Diagnostic Limited MSK Ultrasound of: Right shoulder Biceps tendon is intact in bicipital groove.  Small amount of hypoechoic fluid tracks within biceps tendon sheath. Subscapularis tendon is intact and normal appearing Supraspinatus tendon is intact. Mild subacromial bursitis is  present. Infraspinatus tendon is intact. AC joint degenerative appearing Impression: Subacromial bursitis   PATIENT SURVEYS:  FOTO 55 63 D/C 5 pts MCII              TODAY'S TREATMENT:    Joint mobs: Inf and post R GHJ grade III-IV in open pack Contract relax/stretch to increase Rt shoulder IR   Exercises Rt shoulder flexion and scaption wall slides x10 reps 5 sec hold end range  Supine shoulder ER with yellow TB, focus on achieving end range without elbow extension compensation Supine shoulder flexion with 2lb dowel end range hold 5 sec, x10 reps  Standing Shoulder extension lift off with 1lb dowel 5x10 sec hold  Standing rows GTB 210 reps   Reach behind back to level of sacrum at end of session           PATIENT EDUCATION: Education details: anatomy, exercise progression, possible causes of Rt elbow pain secondary to poor shoulder performance during daily activity   Person educated: Patient Education method: Explanation, Demonstration, Tactile cues, Verbal cues, and Handouts Education comprehension: verbalized understanding, returned demonstration, verbal cues required, and tactile cues required     HOME EXERCISE PROGRAM: Access Code: [email or web address] URL: [email or web address] Date: 03/23/2021 Prepared by: Daleen Bo   Exercises Seated Shoulder Flexion Towel Slide at Table Top - 2 x daily - 7 x weekly - 1 sets - 10 reps - 5 hold Seated Shoulder External Rotation PROM on Table - 2 x daily - 7 x weekly - 1 sets - 10 reps - 5 hold Seated Shoulder External Rotation AAROM with Cane and Hand in Neutral - 2 x daily - 7 x weekly - 1 sets - 10 reps - 5 hold Standing Isometric Shoulder External Rotation with Doorway - 2 x daily - 7 x weekly - 1 sets - 10 reps - 5 hold           ASSESSMENT:   CLINICAL IMPRESSION: Pt continues to report progress towards improving pain with her HEP and throughout the day. Session continued with focus on manual treatment to address  soft tissue and joint restrictions in the posterior Rt shoulder. Pt has had improvements in being able to get her Rt hand behind her back and able to reach the opposite shoulder with horizontal adduction, both which she states she was not able to do prior to beginning  PT. Session continued with therex to promote rotator cuff strength, with PT providing intermittent cues to avoid forearm compensation during rows. Pt denied pain following today's exercises.    Objective impairments include decreased ROM, decreased strength, hypomobility, increased muscle spasms, impaired flexibility, impaired UE functional use, improper body mechanics, postural dysfunction, and pain. These impairments are limiting patient from cleaning, community activity, driving, meal prep, occupation, laundry, yard work, shopping, and caregiver duties . Personal factors including Age, Fitness, Sex, Time since onset of injury/illness/exacerbation, and 1-2 comorbidities:    are also affecting patient's functional outcome. Patient will benefit from skilled PT to address above impairments and improve overall function.   REHAB POTENTIAL: Good   CLINICAL DECISION MAKING: Stable/uncomplicated   EVALUATION COMPLEXITY: Low     GOALS:     SHORT TERM GOALS:   STG Name Target Date Goal status  1 Pt will become independent with HEP in order to demonstrate synthesis of PT education.   Baseline:  04/08/2021 INITIAL  2 Pt will report at least 2 pt reduction on NPRS scale for pain in order to demonstrate functional improvement with household activity, self care, and ADL.   Baseline:  05/06/2021 INITIAL  3 Pt will be able to demonstrate full R OH ROM without pain in order to demonstrate functional improvement in UE function for self-care and house hold duties.  Baseline: 05/06/2021 INITIAL    LONG TERM GOALS:    LTG Name Target Date Goal status  1 Pt  will become independent with final HEP in order to demonstrate synthesis of PT  education.   Baseline: 06/17/2021 INITIAL  2 Pt will be able to reach [facility] and carry/hold >5 lbs in order to demonstrate functional improvement in L UE strength for return to PLOF and exercise.   Baseline: 06/17/2021 INITIAL  3 Pt will be able to demonstrate BHB IR reach without pain in order to demonstrate functional improvement in UE function for self-care and house hold duties.   06/17/2021 INITIAL  4 Pt will score >/= 63 on FOTO to demonstrate functional improvement in shoulder pain.   Baseline: 06/17/2021 INITIAL    PLAN: PT FREQUENCY: 1-2x/week   PT DURATION: 12 weeks (likely DC by 8 weeks)   PLANNED INTERVENTIONS: Therapeutic exercises, Therapeutic activity, Neuro Muscular re-education, Balance training, Gait training, Patient/Family education, Joint mobilization, Aquatic Therapy, Dry Needling, Electrical stimulation, Spinal mobilization, Cryotherapy, Moist heat, Compression bandaging, scar mobilization, Taping, Vasopneumatic device, Traction, Ultrasound, Ionotophoresis 4mg /ml Dexamethasone, and Manual therapy   PLAN FOR NEXT SESSION: update HEP, joint mobilizations; progress scap strength and posterior shoulder strength   11:00 AM,04/01/21 Sherol Dade PT, DPT [facility]

## 2021-04-01 ENCOUNTER — Ambulatory Visit (HOSPITAL_BASED_OUTPATIENT_CLINIC_OR_DEPARTMENT_OTHER): Payer: Medicare Other | Admitting: Physical Therapy

## 2021-04-01 ENCOUNTER — Other Ambulatory Visit: Payer: Self-pay

## 2021-04-01 ENCOUNTER — Encounter (HOSPITAL_BASED_OUTPATIENT_CLINIC_OR_DEPARTMENT_OTHER): Payer: Self-pay | Admitting: Physical Therapy

## 2021-04-01 DIAGNOSIS — M6281 Muscle weakness (generalized): Secondary | ICD-10-CM | POA: Diagnosis not present

## 2021-04-01 DIAGNOSIS — G8929 Other chronic pain: Secondary | ICD-10-CM | POA: Diagnosis not present

## 2021-04-01 DIAGNOSIS — M25511 Pain in right shoulder: Secondary | ICD-10-CM | POA: Diagnosis not present

## 2021-04-01 DIAGNOSIS — M25611 Stiffness of right shoulder, not elsewhere classified: Secondary | ICD-10-CM

## 2021-04-02 ENCOUNTER — Ambulatory Visit: Payer: Medicare Other | Admitting: Family Medicine

## 2021-04-07 ENCOUNTER — Other Ambulatory Visit: Payer: Self-pay

## 2021-04-07 ENCOUNTER — Encounter (HOSPITAL_BASED_OUTPATIENT_CLINIC_OR_DEPARTMENT_OTHER): Payer: Self-pay | Admitting: Physical Therapy

## 2021-04-07 ENCOUNTER — Ambulatory Visit (HOSPITAL_BASED_OUTPATIENT_CLINIC_OR_DEPARTMENT_OTHER): Payer: Medicare Other | Admitting: Physical Therapy

## 2021-04-07 DIAGNOSIS — M6281 Muscle weakness (generalized): Secondary | ICD-10-CM

## 2021-04-07 DIAGNOSIS — M25611 Stiffness of right shoulder, not elsewhere classified: Secondary | ICD-10-CM

## 2021-04-07 DIAGNOSIS — G8929 Other chronic pain: Secondary | ICD-10-CM

## 2021-04-07 NOTE — Therapy (Signed)
OUTPATIENT PHYSICAL THERAPY TREATMENT NOTE   Patient Name: Debbie Marshall MRN: [id number] DOB:11-Nov-1945, 75 y.o., female Today's Date: 04/07/2021  PCP: Burnis Medin, MD REFERRING PROVIDER: Burnis Medin, MD   PT End of Session - 04/07/21 1713     Visit Number 5    Number of Visits [place] Medicare    PT Start Time 9179    PT Stop Time 1505    PT Time Calculation (min) 40 min    Activity Tolerance Patient tolerated treatment well;No increased pain    Behavior During Therapy WFL for tasks assessed/performed              Past Medical History:  Diagnosis Date   ABNORMAL EXAM-BILIARY TRACT 11/14/2008   ABNORMAL TRANSAMINASE, (LFT'S) 11/14/2008    2005 US shows fatty liver   Allergy    Cancer ([place]) 1996   tongue cancer   Diabetes mellitus without complication ([place])    FASTING HYPERGLYCEMIA 09/07/2006   GERD 09/07/2006   HYPERLIPIDEMIA 09/07/2006   HYPOTHYROIDISM 10/30/2006   Nonspecific abnormal results of liver function study 09/07/2006   Normal nuclear stress test 2005   stress cardiolite    OSTEOARTHRITIS 09/07/2006   Positive PPD    [place] vaccine [place]- shows positive after receiving   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  Dr Lucia Gaskins   VITAMIN D DEFICIENCY 08/14/2009   Past Surgical History:  Procedure Laterality Date   COLONOSCOPY     NASAL POLYP SURGERY  04/16/2020   POLYPECTOMY     SALIVARY GLAND SURGERY  1996   Tumor removed on back of tongue and salivary gland   Patient Active Problem List   Diagnosis Date Noted   Hip crepitus 02/27/2014   Visit for preventive health examination 10/28/2013   Nocturia 10/28/2013   Osteopenia 11/15/2012   AC (acromioclavicular) arthritis 11/10/2011   Cubital tunnel syndrome on right 10/14/2011   Arm pain, right 10/14/2011   Arthritis 10/05/2010   Physical exam, annual 06/02/2010   Diabetes mellitus type 2, uncomplicated ([place]) 69/79/4801   Positive PPD 06/02/2010    VITAMIN D DEFICIENCY 08/14/2009   ABNORMAL EXAM-BILIARY TRACT 11/14/2008   PAIN IN JOINT, HAND 07/22/2008   Hypothyroidism 10/30/2006   Hyperlipidemia 09/07/2006   GERD 09/07/2006   OSTEOARTHRITIS 09/07/2006   FASTING HYPERGLYCEMIA 09/07/2006      PCP: Burnis Medin, MD   REFERRING PROVIDER: Burnis Medin, MD   REFERRING DIAG: [phone] (ICD-10-CM) - Chronic right shoulder pain   THERAPY DIAG:  Decreased right shoulder range of motion   Chronic right shoulder pain   Muscle weakness (generalized)     ONSET DATE: 06/2020   SUBJECTIVE:  SUBJECTIVE STATEMENT: Pt states that she is having some anterior and posterior shoulder pain today. She did not change activity and had no issues with HEP.    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 No VAS scale: 0/10 at rest Pain location: R shoulder   PAIN TYPE: aching Pain description: intermittent  Aggravating factors: reaching behind back, dressing, keyboarding Relieving factors: patches, topical ointments, tylenol    PRECAUTIONS: None   PATIENT GOALS : Pt states she would like to reach behind back normally without pain.    OBJECTIVE:   Shoulder flexion 160 ABD to 145 at end of session   DIAGNOSTIC FINDINGS:  FINDINGS: There is no evidence of fracture or dislocation. There is no evidence of arthropathy or other focal bone abnormality. Soft tissues are unremarkable.   IMPRESSION: Negative.   Diagnostic Limited MSK Ultrasound of: Right shoulder Biceps tendon is intact in bicipital groove.  Small amount of hypoechoic fluid tracks within biceps tendon sheath. Subscapularis tendon is intact and normal appearing Supraspinatus tendon is intact. Mild subacromial  bursitis is present. Infraspinatus tendon is intact. AC joint degenerative appearing Impression: Subacromial bursitis   PATIENT SURVEYS:  FOTO 55 63 D/C 5 pts MCII              TODAY'S TREATMENT:    Joint mobs: Inf and post R GHJ grade III-IV in open pack    Exercises Pulley's flexion and ABD 3 mins each Wall ladder 5x scaption and flexion 10s hold Standing pec stretch 30s 3x arm low Standing IR strap stretch 5s 10x (very gentle)           PATIENT EDUCATION: Education details: anatomy, exercise progression, possible causes of Rt elbow pain secondary to poor shoulder performance during daily activity   Person educated: Patient Education method: Explanation, Demonstration, Tactile cues, Verbal cues, and Handouts Education comprehension: verbalized understanding, returned demonstration, verbal cues required, and tactile cues required     HOME EXERCISE PROGRAM: Access Code: [email or web address] URL: [email or web address] Date: 03/23/2021 Prepared by: Daleen Bo   Exercises Seated Shoulder Flexion Towel Slide at Table Top - 2 x daily - 7 x weekly - 1 sets - 10 reps - 5 hold Seated Shoulder External Rotation PROM on Table - 2 x daily - 7 x weekly - 1 sets - 10 reps - 5 hold Seated Shoulder External Rotation AAROM with Cane and Hand in Neutral - 2 x daily - 7 x weekly - 1 sets - 10 reps - 5 hold Standing Isometric Shoulder External Rotation with Doorway - 2 x daily - 7 x weekly - 1 sets - 10 reps - 5 hold           ASSESSMENT:   CLINICAL IMPRESSION: Pt with improvement in ER and OH ROM today by end of session after manual and exercise. Pt presented with increased soreness and stiffness at beginning of session that was relieved with STM and joint mobilizations. Pt with improving ROM and ability to perform reaching. Plan to continue with ROM and strengthening as able.    Objective impairments include decreased ROM, decreased strength, hypomobility, increased muscle  spasms, impaired flexibility, impaired UE functional use, improper body mechanics, postural dysfunction, and pain. These impairments are limiting patient from cleaning, community activity, driving, meal prep, occupation, laundry, yard work, shopping, and caregiver duties . Personal factors including Age, Fitness, Sex, Time since onset of injury/illness/exacerbation, and 1-2 comorbidities:    are  also affecting patient's functional outcome. Patient will benefit from skilled PT to address above impairments and improve overall function.   REHAB POTENTIAL: Good   CLINICAL DECISION MAKING: Stable/uncomplicated   EVALUATION COMPLEXITY: Low     GOALS:     SHORT TERM GOALS:   STG Name Target Date Goal status  1 Pt will become independent with HEP in order to demonstrate synthesis of PT education.   Baseline:  04/08/2021 INITIAL  2 Pt will report at least 2 pt reduction on NPRS scale for pain in order to demonstrate functional improvement with household activity, self care, and ADL.   Baseline:  05/06/2021 INITIAL  3 Pt will be able to demonstrate full R OH ROM without pain in order to demonstrate functional improvement in UE function for self-care and house hold duties.  Baseline: 05/06/2021 INITIAL    LONG TERM GOALS:    LTG Name Target Date Goal status  1 Pt  will become independent with final HEP in order to demonstrate synthesis of PT education.   Baseline: 06/17/2021 INITIAL  2 Pt will be able to reach [facility] and carry/hold >5 lbs in order to demonstrate functional improvement in L UE strength for return to PLOF and exercise.   Baseline: 06/17/2021 INITIAL  3 Pt will be able to demonstrate BHB IR reach without pain in order to demonstrate functional improvement in UE function for self-care and house hold duties.   06/17/2021 INITIAL  4 Pt will score >/= 63 on FOTO to demonstrate functional improvement in shoulder pain.   Baseline: 06/17/2021 INITIAL    PLAN: PT FREQUENCY: 1-2x/week   PT  DURATION: 12 weeks (likely DC by 8 weeks)   PLANNED INTERVENTIONS: Therapeutic exercises, Therapeutic activity, Neuro Muscular re-education, Balance training, Gait training, Patient/Family education, Joint mobilization, Aquatic Therapy, Dry Needling, Electrical stimulation, Spinal mobilization, Cryotherapy, Moist heat, Compression bandaging, scar mobilization, Taping, Vasopneumatic device, Traction, Ultrasound, Ionotophoresis 4mg /ml Dexamethasone, and Manual therapy   PLAN FOR NEXT SESSION: update HEP, joint mobilizations; progress scap strength and posterior shoulder strength  Daleen Bo PT, DPT 04/07/21 5:30 PM

## 2021-04-09 ENCOUNTER — Encounter (HOSPITAL_BASED_OUTPATIENT_CLINIC_OR_DEPARTMENT_OTHER): Payer: Self-pay | Admitting: Physical Therapy

## 2021-04-09 ENCOUNTER — Other Ambulatory Visit: Payer: Self-pay

## 2021-04-09 ENCOUNTER — Ambulatory Visit (HOSPITAL_BASED_OUTPATIENT_CLINIC_OR_DEPARTMENT_OTHER): Payer: Medicare Other | Admitting: Physical Therapy

## 2021-04-09 DIAGNOSIS — M6281 Muscle weakness (generalized): Secondary | ICD-10-CM

## 2021-04-09 DIAGNOSIS — G8929 Other chronic pain: Secondary | ICD-10-CM

## 2021-04-09 DIAGNOSIS — M25611 Stiffness of right shoulder, not elsewhere classified: Secondary | ICD-10-CM | POA: Diagnosis not present

## 2021-04-09 DIAGNOSIS — M25511 Pain in right shoulder: Secondary | ICD-10-CM | POA: Diagnosis not present

## 2021-04-09 NOTE — Therapy (Signed)
OUTPATIENT PHYSICAL THERAPY TREATMENT NOTE   Patient Name: Debbie Marshall MRN: [id number] DOB:1945/10/24, 75 y.o., female Today's Date: 04/09/2021  PCP: Burnis Medin, MD REFERRING PROVIDER: Burnis Medin, MD   PT End of Session - 04/09/21 [phone]     Visit Number 6    Number of Visits [place] Medicare    PT Start Time [phone]   pt arrives late   PT Stop Time 0843    PT Time Calculation (min) 33 min    Activity Tolerance Patient tolerated treatment well;No increased pain    Behavior During Therapy WFL for tasks assessed/performed               Past Medical History:  Diagnosis Date   ABNORMAL EXAM-BILIARY TRACT 11/14/2008   ABNORMAL TRANSAMINASE, (LFT'S) 11/14/2008    2005 US shows fatty liver   Allergy    Cancer (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  Dr Lucia Gaskins   VITAMIN D DEFICIENCY 08/14/2009   Past Surgical History:  Procedure Laterality Date   COLONOSCOPY     NASAL POLYP SURGERY  04/16/2020   POLYPECTOMY     SALIVARY GLAND SURGERY  1996   Tumor removed on back of tongue and salivary gland   Patient Active Problem List   Diagnosis Date Noted   Hip crepitus 02/27/2014   Visit for preventive health examination 10/28/2013   Nocturia 10/28/2013   Osteopenia 11/15/2012   AC (acromioclavicular) arthritis 11/10/2011   Cubital tunnel syndrome on right 10/14/2011   Arm pain, right 10/14/2011   Arthritis 10/05/2010   Physical exam, annual 06/02/2010   Diabetes mellitus type 2, uncomplicated ([place]) 06/30/2246   Positive PPD  06/02/2010   VITAMIN D DEFICIENCY 08/14/2009   ABNORMAL EXAM-BILIARY TRACT 11/14/2008   PAIN IN JOINT, HAND 07/22/2008   Hypothyroidism 10/30/2006   Hyperlipidemia 09/07/2006   GERD 09/07/2006   OSTEOARTHRITIS 09/07/2006   FASTING HYPERGLYCEMIA 09/07/2006      PCP: Burnis Medin, MD   REFERRING PROVIDER: Burnis Medin, MD   REFERRING DIAG: [phone] (ICD-10-CM) - Chronic right shoulder pain   THERAPY DIAG:  Decreased right shoulder range of motion   Chronic right shoulder pain   Muscle weakness (generalized)     ONSET DATE: 06/2020   SUBJECTIVE:  SUBJECTIVE STATEMENT: Pt states the shoulder soreness is better today. It is better today. She did not have pain after last session. 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 No VAS scale: 0/10 at rest Pain location: R shoulder   PAIN TYPE: aching Pain description: intermittent  Aggravating factors: reaching behind back, dressing, keyboarding Relieving factors: patches, topical ointments, tylenol    PRECAUTIONS: None   PATIENT GOALS : Pt states she would like to reach behind back normally without pain.    OBJECTIVE:   Shoulder flexion 160 ABD to 145 at end of session   DIAGNOSTIC FINDINGS:  FINDINGS: There is no evidence of fracture or dislocation. There is no evidence of arthropathy or other focal bone abnormality. Soft tissues are unremarkable.   IMPRESSION: Negative.   Diagnostic Limited MSK Ultrasound of: Right shoulder Biceps tendon is intact in bicipital groove.  Small amount of hypoechoic fluid tracks within biceps tendon sheath. Subscapularis tendon is intact and normal appearing Supraspinatus tendon is  intact. Mild subacromial bursitis is present. Infraspinatus tendon is intact. AC joint degenerative appearing Impression: Subacromial bursitis   PATIENT SURVEYS:  FOTO 55 63 D/C 5 pts MCII              TODAY'S TREATMENT:    Joint mobs: Inf and post R GHJ grade III-IV in open pack Shoulder clocks AP grade III    Exercises Supine cane flexion 2lb bar 5s 10x Scap rolls and implementation throughout work day 20x Open book 5s 10x each side Pulley's ABD 3 mins  Rowing RTB 2x10 Standing IR strap stretch 5s 10x (very gentle)           PATIENT EDUCATION: Education details: anatomy, exercise progression  Person educated: Patient Education method: Explanation, Demonstration, Tactile cues, Verbal cues, and Handouts Education comprehension: verbalized understanding, returned demonstration, verbal cues required, and tactile cues required     HOME EXERCISE PROGRAM: Access Code: [email or web address] URL: [email or web address] Date: 04/09/2021 Prepared by: Daleen Bo  Exercises Seated Shoulder Flexion Towel Slide at Table Top - 2 x daily - 7 x weekly - 1 sets - 10 reps - 5 hold Seated Shoulder External Rotation PROM on Table - 2 x daily - 7 x weekly - 1 sets - 10 reps - 5 hold Standing Shoulder Internal Rotation Stretch with Towel - 2 x daily - 7 x weekly - 1 sets - 10 reps - 5 hold Standing Shoulder Row with Anchored Resistance - 1 x daily - 7 x weekly - 3 sets - 10 reps Seated Scapular Retraction - 5-6 x daily - 7 x weekly - 1 sets - 10 reps          ASSESSMENT:   CLINICAL IMPRESSION: Pt with improvement in IR and ABD by end of session. Pt responded well to manual mobilizations as well as self thoracic mobilization. Pt very limited with R thoracic rotation and extension that may be related to static positioning during work day. Plan to continue with shoudler and thoracic mobility as able. Possibly try prone ant GHJ mob for increasing IR at next session, potentially too  painful.    Objective impairments include decreased ROM, decreased strength, hypomobility, increased muscle spasms, impaired flexibility, impaired UE functional use, improper body mechanics, postural dysfunction, and pain. These impairments are limiting patient from cleaning, community activity, driving, meal prep, occupation, laundry, yard work, shopping, and caregiver duties .  Personal factors including Age, Fitness, Sex, Time since onset of injury/illness/exacerbation, and 1-2 comorbidities:    are also affecting patient's functional outcome. Patient will benefit from skilled PT to address above impairments and improve overall function.   REHAB POTENTIAL: Good   CLINICAL DECISION MAKING: Stable/uncomplicated   EVALUATION COMPLEXITY: Low     GOALS:     SHORT TERM GOALS:   STG Name Target Date Goal status  1 Pt will become independent with HEP in order to demonstrate synthesis of PT education.   Baseline:  04/08/2021 INITIAL  2 Pt will report at least 2 pt reduction on NPRS scale for pain in order to demonstrate functional improvement with household activity, self care, and ADL.   Baseline:  05/06/2021 INITIAL  3 Pt will be able to demonstrate full R OH ROM without pain in order to demonstrate functional improvement in UE function for self-care and house hold duties.  Baseline: 05/06/2021 INITIAL    LONG TERM GOALS:    LTG Name Target Date Goal status  1 Pt  will become independent with final HEP in order to demonstrate synthesis of PT education.   Baseline: 06/17/2021 INITIAL  2 Pt will be able to reach [facility] and carry/hold >5 lbs in order to demonstrate functional improvement in L UE strength for return to PLOF and exercise.   Baseline: 06/17/2021 INITIAL  3 Pt will be able to demonstrate BHB IR reach without pain in order to demonstrate functional improvement in UE function for self-care and house hold duties.   06/17/2021 INITIAL  4 Pt will score >/= 63 on FOTO to demonstrate  functional improvement in shoulder pain.   Baseline: 06/17/2021 INITIAL    PLAN: PT FREQUENCY: 1-2x/week   PT DURATION: 12 weeks (likely DC by 8 weeks)   PLANNED INTERVENTIONS: Therapeutic exercises, Therapeutic activity, Neuro Muscular re-education, Balance training, Gait training, Patient/Family education, Joint mobilization, Aquatic Therapy, Dry Needling, Electrical stimulation, Spinal mobilization, Cryotherapy, Moist heat, Compression bandaging, scar mobilization, Taping, Vasopneumatic device, Traction, Ultrasound, Ionotophoresis 4mg /ml Dexamethasone, and Manual therapy   PLAN FOR NEXT SESSION:  joint mobilizations; progress scap strength and posterior shoulder strength  Daleen Bo PT, DPT 04/09/21 8:47 AM

## 2021-04-13 ENCOUNTER — Ambulatory Visit (HOSPITAL_BASED_OUTPATIENT_CLINIC_OR_DEPARTMENT_OTHER): Payer: Medicare Other | Attending: Family Medicine | Admitting: Physical Therapy

## 2021-04-13 ENCOUNTER — Encounter (HOSPITAL_BASED_OUTPATIENT_CLINIC_OR_DEPARTMENT_OTHER): Payer: Self-pay | Admitting: Physical Therapy

## 2021-04-13 ENCOUNTER — Other Ambulatory Visit: Payer: Self-pay

## 2021-04-13 DIAGNOSIS — G8929 Other chronic pain: Secondary | ICD-10-CM | POA: Diagnosis not present

## 2021-04-13 DIAGNOSIS — M6281 Muscle weakness (generalized): Secondary | ICD-10-CM | POA: Insufficient documentation

## 2021-04-13 DIAGNOSIS — M25511 Pain in right shoulder: Secondary | ICD-10-CM | POA: Insufficient documentation

## 2021-04-13 DIAGNOSIS — M25611 Stiffness of right shoulder, not elsewhere classified: Secondary | ICD-10-CM | POA: Diagnosis not present

## 2021-04-13 NOTE — Therapy (Signed)
OUTPATIENT PHYSICAL THERAPY TREATMENT NOTE   Patient Name: Debbie Marshall MRN: [id number] DOB:03/07/1946, 76 y.o., female Today's Date: 04/13/2021  PCP: Burnis Medin, MD REFERRING PROVIDER: Burnis Medin, MD   PT End of Session - 04/13/21 1520     Visit Number 7    Number of Visits 22    Authorization Type [employer] Medicare    PT Start Time 1520    PT Stop Time 1600    PT Time Calculation (min) 40 min    Activity Tolerance Patient tolerated treatment well;No increased pain    Behavior During Therapy WFL for tasks assessed/performed               Past Medical History:  Diagnosis Date   ABNORMAL EXAM-BILIARY TRACT 11/14/2008   ABNORMAL TRANSAMINASE, (LFT'S) 11/14/2008    2005 US shows fatty liver   Allergy    Cancer ([place]) 1996   tongue cancer   Diabetes mellitus without complication ([place])    FASTING HYPERGLYCEMIA 09/07/2006   GERD 09/07/2006   HYPERLIPIDEMIA 09/07/2006   HYPOTHYROIDISM 10/30/2006   Nonspecific abnormal results of liver function study 09/07/2006   Normal nuclear stress test 2005   stress cardiolite    OSTEOARTHRITIS 09/07/2006   Positive PPD    [place] vaccine [place]- shows positive after receiving   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  Dr Lucia Gaskins   VITAMIN D DEFICIENCY 08/14/2009   Past Surgical History:  Procedure Laterality Date   COLONOSCOPY     NASAL POLYP SURGERY  04/16/2020   POLYPECTOMY     SALIVARY GLAND SURGERY  1996   Tumor removed on back of tongue and salivary gland   Patient Active Problem List   Diagnosis Date Noted   Hip crepitus 02/27/2014   Visit for preventive health examination 10/28/2013   Nocturia 10/28/2013   Osteopenia 11/15/2012   AC (acromioclavicular) arthritis 11/10/2011   Cubital tunnel syndrome on right 10/14/2011   Arm pain, right 10/14/2011   Arthritis 10/05/2010   Physical exam, annual 06/02/2010   Diabetes mellitus type 2, uncomplicated ([place]) 68/03/7516   Positive PPD 06/02/2010    VITAMIN D DEFICIENCY 08/14/2009   ABNORMAL EXAM-BILIARY TRACT 11/14/2008   PAIN IN JOINT, HAND 07/22/2008   Hypothyroidism 10/30/2006   Hyperlipidemia 09/07/2006   GERD 09/07/2006   OSTEOARTHRITIS 09/07/2006   FASTING HYPERGLYCEMIA 09/07/2006      PCP: Burnis Medin, MD   REFERRING PROVIDER: Burnis Medin, MD   REFERRING DIAG: [phone] (ICD-10-CM) - Chronic right shoulder pain   THERAPY DIAG:  Decreased right shoulder range of motion   Chronic right shoulder pain   Muscle weakness (generalized)     ONSET DATE: 06/2020   SUBJECTIVE:  SUBJECTIVE STATEMENT: Pt states the shoulder soreness is better today.She states that the IR stretching exercise is very painful so she has not done much of that today. However, she is able to reach behind her back better than before.    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 Yes VAS scale: 2/10 posteriorly Pain location: R shoulder   PAIN TYPE: aching Pain description: intermittent  Aggravating factors: reaching behind back, dressing, keyboarding Relieving factors: patches, topical ointments, tylenol    PRECAUTIONS: None   PATIENT GOALS : Pt states she would like to reach behind back normally without pain.    OBJECTIVE:     DIAGNOSTIC FINDINGS:  FINDINGS: There is no evidence of fracture or dislocation. There is no evidence of arthropathy or other focal bone abnormality. Soft tissues are unremarkable.   IMPRESSION: Negative.   Diagnostic Limited MSK Ultrasound of: Right shoulder Biceps tendon is intact in bicipital groove.  Small amount of hypoechoic fluid tracks within biceps tendon sheath. Subscapularis tendon is intact and normal appearing Supraspinatus tendon  is intact. Mild subacromial bursitis is present. Infraspinatus tendon is intact. AC joint degenerative appearing Impression: Subacromial bursitis   PATIENT SURVEYS:  FOTO 55 63 D/C 5 pts MCII   6th visit FOTO 1/3: 73 pts            TODAY'S TREATMENT:    Joint mobs: Inf and post R GHJ grade III-IV in open pack Shoulder clocks AP grade III; prone ant GHJ mob grade III- arm at 75 deg ABD    Exercises Scap rolls 20x Open book 5s 10x each side Pulley's ABD 3 mins  2lb bar cane extension stretch 10x 5x Cane extension with side to side 10x            PATIENT EDUCATION: Education details: anatomy, exercise progression  Person educated: Patient Education method: Explanation, Demonstration, Tactile cues, Verbal cues, and Handouts Education comprehension: verbalized understanding, returned demonstration, verbal cues required, and tactile cues required     HOME EXERCISE PROGRAM: Access Code: [email or web address] URL: [email or web address] Date: 04/13/2021 Prepared by: Daleen Bo  Exercises Seated Shoulder Flexion Towel Slide at Table Top - 2 x daily - 7 x weekly - 1 sets - 10 reps - 5 hold Seated Shoulder External Rotation PROM on Table - 2 x daily - 7 x weekly - 1 sets - 10 reps - 5 hold Standing Shoulder Row with Anchored Resistance - 1 x daily - 7 x weekly - 3 sets - 10 reps Seated Scapular Retraction - 5-6 x daily - 7 x weekly - 1 sets - 10 reps Standing Shoulder Extension with Dowel - 1 x daily - 7 x weekly - 2 sets - 10 reps - 5 hold        ASSESSMENT:   CLINICAL IMPRESSION: Pt  was able to improve IR ROM by end of session- more adduction toward left pocket noted. Pt unable to tolerate IR strap stretch so changed to a cane extension stretch at this time. Pt with significant tightness during ant GHJ mobilization but no increased pain. ROM is slowly improving. Possibly try dropping frequency of visits next week if continues to make progress without pain  exacerbation. Pt has reach D/C FOTO goal at visit 7.    Objective impairments include decreased ROM, decreased strength, hypomobility, increased muscle spasms, impaired flexibility, impaired UE functional use, improper body mechanics, postural dysfunction, and pain. These impairments are limiting patient  from cleaning, community activity, driving, meal prep, occupation, Medical sales representative, yard work, shopping, and caregiver duties . Personal factors including Age, Fitness, Sex, Time since onset of injury/illness/exacerbation, and 1-2 comorbidities:    are also affecting patient's functional outcome. Patient will benefit from skilled PT to address above impairments and improve overall function.   REHAB POTENTIAL: Good   CLINICAL DECISION MAKING: Stable/uncomplicated   EVALUATION COMPLEXITY: Low     GOALS:     SHORT TERM GOALS:   STG Name Target Date Goal status  1 Pt will become independent with HEP in order to demonstrate synthesis of PT education.   Baseline:  04/08/2021 MET  2 Pt will report at least 2 pt reduction on NPRS scale for pain in order to demonstrate functional improvement with household activity, self care, and ADL.   Baseline:  05/06/2021 MET  3 Pt will be able to demonstrate full R OH ROM without pain in order to demonstrate functional improvement in UE function for self-care and house hold duties.  Baseline: 05/06/2021 ongoing    LONG TERM GOALS:    LTG Name Target Date Goal status  1 Pt  will become independent with final HEP in order to demonstrate synthesis of PT education.   Baseline: 06/17/2021 ongoing  2 Pt will be able to reach [facility] and carry/hold >5 lbs in order to demonstrate functional improvement in L UE strength for return to PLOF and exercise.   Baseline: 06/17/2021 ongoing  3 Pt will be able to demonstrate BHB IR reach without pain in order to demonstrate functional improvement in UE function for self-care and house hold duties.   06/17/2021 ongoing  4 Pt will score >/=  63 on FOTO to demonstrate functional improvement in shoulder pain.   Baseline: 06/17/2021 MET    PLAN: PT FREQUENCY: 1-2x/week   PT DURATION: 12 weeks (likely DC by 8 weeks)   PLANNED INTERVENTIONS: Therapeutic exercises, Therapeutic activity, Neuro Muscular re-education, Balance training, Gait training, Patient/Family education, Joint mobilization, Aquatic Therapy, Dry Needling, Electrical stimulation, Spinal mobilization, Cryotherapy, Moist heat, Compression bandaging, scar mobilization, Taping, Vasopneumatic device, Traction, Ultrasound, Ionotophoresis 75m/ml Dexamethasone, and Manual therapy   PLAN FOR NEXT SESSION:  joint mobilizations; progress scap strength and posterior shoulder strength  ADaleen BoPT, DPT 04/13/21 4:06 PM

## 2021-04-15 ENCOUNTER — Other Ambulatory Visit: Payer: Self-pay | Admitting: Internal Medicine

## 2021-04-15 ENCOUNTER — Ambulatory Visit (HOSPITAL_BASED_OUTPATIENT_CLINIC_OR_DEPARTMENT_OTHER): Payer: Medicare Other | Admitting: Physical Therapy

## 2021-04-15 DIAGNOSIS — E119 Type 2 diabetes mellitus without complications: Secondary | ICD-10-CM

## 2021-04-20 ENCOUNTER — Other Ambulatory Visit: Payer: Self-pay

## 2021-04-20 ENCOUNTER — Encounter (HOSPITAL_BASED_OUTPATIENT_CLINIC_OR_DEPARTMENT_OTHER): Payer: Self-pay | Admitting: Physical Therapy

## 2021-04-20 ENCOUNTER — Ambulatory Visit (HOSPITAL_BASED_OUTPATIENT_CLINIC_OR_DEPARTMENT_OTHER): Payer: Medicare Other | Admitting: Physical Therapy

## 2021-04-20 DIAGNOSIS — G8929 Other chronic pain: Secondary | ICD-10-CM

## 2021-04-20 DIAGNOSIS — M25611 Stiffness of right shoulder, not elsewhere classified: Secondary | ICD-10-CM

## 2021-04-20 DIAGNOSIS — M6281 Muscle weakness (generalized): Secondary | ICD-10-CM | POA: Diagnosis not present

## 2021-04-20 DIAGNOSIS — M25511 Pain in right shoulder: Secondary | ICD-10-CM | POA: Diagnosis not present

## 2021-04-20 NOTE — Therapy (Signed)
OUTPATIENT PHYSICAL THERAPY TREATMENT NOTE   Patient Name: Debbie Marshall MRN: [id number] DOB:10/26/1945, 76 y.o., female Today's Date: 04/20/2021  PCP: Burnis Medin, MD REFERRING PROVIDER: Burnis Medin, MD   PT End of Session - 04/20/21 1456     Visit Number 8    Number of Visits 22    Authorization Type [employer]HC Medicare    PT Start Time 6950    PT Stop Time 7225    PT Time Calculation (min) 40 min    Activity Tolerance Patient tolerated treatment well;No increased pain    Behavior During Therapy WFL for tasks assessed/performed                Past Medical History:  Diagnosis Date   ABNORMAL EXAM-BILIARY TRACT 11/14/2008   ABNORMAL TRANSAMINASE, (LFT'S) 11/14/2008    2005 US shows fatty liver   Allergy    Cancer ([place]) 1996   tongue cancer   Diabetes mellitus without complication ([place])    FASTING HYPERGLYCEMIA 09/07/2006   GERD 09/07/2006   HYPERLIPIDEMIA 09/07/2006   HYPOTHYROIDISM 10/30/2006   Nonspecific abnormal results of liver function study 09/07/2006   Normal nuclear stress test 2005   stress cardiolite    OSTEOARTHRITIS 09/07/2006   Positive PPD    [place]- shows positive after receiving   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  Dr Lucia Gaskins   VITAMIN D DEFICIENCY 08/14/2009   Past Surgical History:  Procedure Laterality Date   COLONOSCOPY     NASAL POLYP SURGERY  04/16/2020   POLYPECTOMY     SALIVARY GLAND SURGERY  1996   Tumor removed on back of tongue and salivary gland   Patient Active Problem List   Diagnosis Date Noted   Hip crepitus 02/27/2014   Visit for preventive health examination 10/28/2013   Nocturia 10/28/2013   Osteopenia 11/15/2012   AC (acromioclavicular) arthritis 11/10/2011   Cubital tunnel syndrome on right 10/14/2011   Arm pain, right 10/14/2011   Arthritis 10/05/2010   Physical exam, annual 06/02/2010   Diabetes mellitus type 2, uncomplicated ([place]) 75/08/1831   Positive PPD 06/02/2010    VITAMIN D DEFICIENCY 08/14/2009   ABNORMAL EXAM-BILIARY TRACT 11/14/2008   PAIN IN JOINT, HAND 07/22/2008   Hypothyroidism 10/30/2006   Hyperlipidemia 09/07/2006   GERD 09/07/2006   OSTEOARTHRITIS 09/07/2006   FASTING HYPERGLYCEMIA 09/07/2006      PCP: Burnis Medin, MD   REFERRING PROVIDER: Burnis Medin, MD   REFERRING DIAG: [phone] (ICD-10-CM) - Chronic right shoulder pain   THERAPY DIAG:  Decreased right shoulder range of motion   Chronic right shoulder pain   Muscle weakness (generalized)     ONSET DATE: 06/2020   SUBJECTIVE:  SUBJECTIVE STATEMENT: Pt states that reaching across her body is much better. She still has anterior shoulder pain. She states IR is still very painful.    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 Yes VAS scale: 0/10 posteriorly Pain location: R shoulder   PAIN TYPE: aching Pain description: intermittent  Aggravating factors: reaching behind back, dressing, keyboarding Relieving factors: patches, topical ointments, tylenol    PRECAUTIONS: None   PATIENT GOALS : Pt states she would like to reach behind back normally without pain.    OBJECTIVE:     DIAGNOSTIC FINDINGS:  FINDINGS: There is no evidence of fracture or dislocation. There is no evidence of arthropathy or other focal bone abnormality. Soft tissues are unremarkable.   IMPRESSION: Negative.   Diagnostic Limited MSK Ultrasound of: Right shoulder Biceps tendon is intact in bicipital groove.  Small amount of hypoechoic fluid tracks within biceps tendon sheath. Subscapularis tendon is intact and normal appearing Supraspinatus tendon is intact. Mild subacromial bursitis is present. Infraspinatus tendon is  intact. AC joint degenerative appearing Impression: Subacromial bursitis   PATIENT SURVEYS:  FOTO 55 63 D/C 5 pts MCII   6th visit FOTO 1/3: 73 pts            TODAY'S TREATMENT:    Joint mobs: Inf and post R GHJ grade IV in open pack supine ant GHJ mob grade III; shoulder clocks    Exercises  Sleeper stretch 10s 10x T/S open book 5s 10x Shoulder extension RTB 2x10 2lb bar cane extension stretch 10x 5s Cane extension with side to side 20x  Review of HEP updated           PATIENT EDUCATION: Education details: anatomy, exercise progression, acceptable pain levels Person educated: Patient Education method: Explanation, Demonstration, Tactile cues, Verbal cues, and Handouts Education comprehension: verbalized understanding, returned demonstration, verbal cues required, and tactile cues required     HOME EXERCISE PROGRAM: Access Code: [email or web address] URL: [email or web address] Date: 04/20/2021 Prepared by: Daleen Bo  Exercises Seated Shoulder Flexion Towel Slide at Table Top - 2 x daily - 7 x weekly - 1 sets - 10 reps - 5 hold Sleeper Stretch - 2 x daily - 7 x weekly - 1 sets - 10 reps - 10 hold Sidelying Open Book - 2 x daily - 7 x weekly - 2 sets - 10 reps - 5 hold Shoulder extension with resistance - Neutral - 1 x daily - 7 x weekly - 3 sets - 10 reps Standing Shoulder Extension with Dowel - 1 x daily - 7 x weekly - 2 sets - 10 reps - 5 hold          ASSESSMENT:   CLINICAL IMPRESSION: Pt  with signifcant improvement in ER ROM, but is still largely limited in IR BHB reaching motions. Pt's isolated shoulder ext, IR, and ADD are as expected at this time but the combined tri-planar motion compounds the GHJ stiffness. Pt was able to progress HEP and incorporate more resisted exercise as well as include direct IR stretch without exacerbating pain. T/S self mob also included for home due to static positioning with occupational demands. Plan to continue with  ROM as tolerated.    Objective impairments include decreased ROM, decreased strength, hypomobility, increased muscle spasms, impaired flexibility, impaired UE functional use, improper body mechanics, postural dysfunction, and pain. These impairments are limiting patient from cleaning, community activity, driving, meal prep, occupation, laundry, yard work,  shopping, and caregiver duties . Personal factors including Age, Fitness, Sex, Time since onset of injury/illness/exacerbation, and 1-2 comorbidities:    are also affecting patient's functional outcome. Patient will benefit from skilled PT to address above impairments and improve overall function.   REHAB POTENTIAL: Good   CLINICAL DECISION MAKING: Stable/uncomplicated   EVALUATION COMPLEXITY: Low     GOALS:     SHORT TERM GOALS:   STG Name Target Date Goal status  1 Pt will become independent with HEP in order to demonstrate synthesis of PT education.   Baseline:  04/08/2021 MET  2 Pt will report at least 2 pt reduction on NPRS scale for pain in order to demonstrate functional improvement with household activity, self care, and ADL.   Baseline:  05/06/2021 MET  3 Pt will be able to demonstrate full R OH ROM without pain in order to demonstrate functional improvement in UE function for self-care and house hold duties.  Baseline: 05/06/2021 ongoing    LONG TERM GOALS:    LTG Name Target Date Goal status  1 Pt  will become independent with final HEP in order to demonstrate synthesis of PT education.   Baseline: 06/17/2021 ongoing  2 Pt will be able to reach [facility] and carry/hold >5 lbs in order to demonstrate functional improvement in L UE strength for return to PLOF and exercise.   Baseline: 06/17/2021 ongoing  3 Pt will be able to demonstrate BHB IR reach without pain in order to demonstrate functional improvement in UE function for self-care and house hold duties.   06/17/2021 ongoing  4 Pt will score >/= 63 on FOTO to demonstrate  functional improvement in shoulder pain.   Baseline: 06/17/2021 MET    PLAN: PT FREQUENCY: 1-2x/week   PT DURATION: 12 weeks (likely DC by 8 weeks)   PLANNED INTERVENTIONS: Therapeutic exercises, Therapeutic activity, Neuro Muscular re-education, Balance training, Gait training, Patient/Family education, Joint mobilization, Aquatic Therapy, Dry Needling, Electrical stimulation, Spinal mobilization, Cryotherapy, Moist heat, Compression bandaging, scar mobilization, Taping, Vasopneumatic device, Traction, Ultrasound, Ionotophoresis 84m/ml Dexamethasone, and Manual therapy   PLAN FOR NEXT SESSION:  joint mobilizations; progress scap strength and posterior shoulder strength  ADaleen BoPT, DPT 04/20/21 3:18 PM

## 2021-04-21 ENCOUNTER — Ambulatory Visit (INDEPENDENT_AMBULATORY_CARE_PROVIDER_SITE_OTHER): Payer: Medicare Other

## 2021-04-21 VITALS — Ht 59.0 in | Wt 119.0 lb

## 2021-04-21 DIAGNOSIS — Z0000 Encounter for general adult medical examination without abnormal findings: Secondary | ICD-10-CM

## 2021-04-21 NOTE — Patient Instructions (Signed)
Debbie Marshall , Thank you for taking time to come for your Medicare Wellness Visit. I appreciate your ongoing commitment to your health goals. Please review the following plan we discussed and let me know if I can assist you in the future.   These are the goals we discussed:  Goals      Exercise 3x per week (30 min per time)        This is a list of the screening recommended for you and due dates:  Health Maintenance  Topic Date Due   COVID-19 Vaccine (4 - Booster for Pfizer series) 05/07/2021*   Zoster (Shingles) Vaccine (1 of 2) 07/20/2021*   Eye exam for diabetics  07/29/2021   Hemoglobin A1C  08/29/2021   Urine Protein Check  03/01/2022   Complete foot exam   03/08/2022   Mammogram  07/08/2022   Tetanus Vaccine  06/13/2023   Colon Cancer Screening  09/23/2023   Pneumonia Vaccine  Completed   Flu Shot  Completed   DEXA scan (bone density measurement)  Completed   Hepatitis C Screening: USPSTF Recommendation to screen - Ages 13-79 yo.  Completed   HPV Vaccine  Aged Out  *Topic was postponed. The date shown is not the original due date.    Advanced directives: No   Conditions/risks identified: None  Next appointment: Follow up in one year for your annual wellness visit   Preventive Care 65 Years and Older, Female Preventive care refers to lifestyle choices and visits with your health care provider that can promote health and wellness. What does preventive care include? A yearly physical exam. This is also called an annual well check. Dental exams once or twice a year. Routine eye exams. Ask your health care provider how often you should have your eyes checked. Personal lifestyle choices, including: Daily care of your teeth and gums. Regular physical activity. Eating a healthy diet. Avoiding tobacco and drug use. Limiting alcohol use. Practicing safe sex. Taking low-dose aspirin every day. Taking vitamin and mineral supplements as recommended by your health care  provider. What happens during an annual well check? The services and screenings done by your health care provider during your annual well check will depend on your age, overall health, lifestyle risk factors, and family history of disease. Counseling  Your health care provider may ask you questions about your: Alcohol use. Tobacco use. Drug use. Emotional well-being. Home and relationship well-being. Sexual activity. Eating habits. History of falls. Memory and ability to understand (cognition). Work and work Statistician. Reproductive health. Screening  You may have the following tests or measurements: Height, weight, and BMI. Blood pressure. Lipid and cholesterol levels. These may be checked every 5 years, or more frequently if you are over 13 years old. Skin check. Lung cancer screening. You may have this screening every year starting at age 107 if you have a 30-pack-year history of smoking and currently smoke or have quit within the past 15 years. Fecal occult blood test (FOBT) of the stool. You may have this test every year starting at age 37. Flexible sigmoidoscopy or colonoscopy. You may have a sigmoidoscopy every 5 years or a colonoscopy every 10 years starting at age 61. Hepatitis C blood test. Hepatitis B blood test. Sexually transmitted disease (STD) testing. Diabetes screening. This is done by checking your blood sugar (glucose) after you have not eaten for a while (fasting). You may have this done every 1-3 years. Bone density scan. This is done to screen for osteoporosis. You  may have this done starting at age 17. Mammogram. This may be done every 1-2 years. Talk to your health care provider about how often you should have regular mammograms. Talk with your health care provider about your test results, treatment options, and if necessary, the need for more tests. Vaccines  Your health care provider may recommend certain vaccines, such as: Influenza vaccine. This is  recommended every year. Tetanus, diphtheria, and acellular pertussis (Tdap, Td) vaccine. You may need a Td booster every 10 years. Zoster vaccine. You may need this after age 63. Pneumococcal 13-valent conjugate (PCV13) vaccine. One dose is recommended after age 14. Pneumococcal polysaccharide (PPSV23) vaccine. One dose is recommended after age 24. Talk to your health care provider about which screenings and vaccines you need and how often you need them. This information is not intended to replace advice given to you by your health care provider. Make sure you discuss any questions you have with your health care provider. Document Released: 04/24/2015 Document Revised: 12/16/2015 Document Reviewed: 01/27/2015 Elsevier Interactive Patient Education  2017 [place] Prevention in the Home Falls can cause injuries. They can happen to people of all ages. There are many things you can do to make your home safe and to help prevent falls. What can I do on the outside of my home? Regularly fix the edges of walkways and driveways and fix any cracks. Remove anything that might make you trip as you walk through a door, such as a raised step or threshold. Trim any bushes or trees on the path to your home. Use bright outdoor lighting. Clear any walking paths of anything that might make someone trip, such as rocks or tools. Regularly check to see if handrails are loose or broken. Make sure that both sides of any steps have handrails. Any raised decks and porches should have guardrails on the edges. Have any leaves, snow, or ice cleared regularly. Use sand or salt on walking paths during winter. Clean up any spills in your garage right away. This includes oil or grease spills. What can I do in the bathroom? Use night lights. Install grab bars by the toilet and in the tub and shower. Do not use towel bars as grab bars. Use non-skid mats or decals in the tub or shower. If you need to sit down in  the shower, use a plastic, non-slip stool. Keep the floor dry. Clean up any water that spills on the floor as soon as it happens. Remove soap buildup in the tub or shower regularly. Attach bath mats securely with double-sided non-slip rug tape. Do not have throw rugs and other things on the floor that can make you trip. What can I do in the bedroom? Use night lights. Make sure that you have a light by your bed that is easy to reach. Do not use any sheets or blankets that are too big for your bed. They should not hang down onto the floor. Have a firm chair that has side arms. You can use this for support while you get dressed. Do not have throw rugs and other things on the floor that can make you trip. What can I do in the kitchen? Clean up any spills right away. Avoid walking on wet floors. Keep items that you use a lot in easy-to-reach places. If you need to reach something above you, use a strong step stool that has a grab bar. Keep electrical cords out of the way. Do not use floor  polish or wax that makes floors slippery. If you must use wax, use non-skid floor wax. Do not have throw rugs and other things on the floor that can make you trip. What can I do with my stairs? Do not leave any items on the stairs. Make sure that there are handrails on both sides of the stairs and use them. Fix handrails that are broken or loose. Make sure that handrails are as long as the stairways. Check any carpeting to make sure that it is firmly attached to the stairs. Fix any carpet that is loose or worn. Avoid having throw rugs at the top or bottom of the stairs. If you do have throw rugs, attach them to the floor with carpet tape. Make sure that you have a light switch at the top of the stairs and the bottom of the stairs. If you do not have them, ask someone to add them for you. What else can I do to help prevent falls? Wear shoes that: Do not have high heels. Have rubber bottoms. Are comfortable  and fit you well. Are closed at the toe. Do not wear sandals. If you use a stepladder: Make sure that it is fully opened. Do not climb a closed stepladder. Make sure that both sides of the stepladder are locked into place. Ask someone to hold it for you, if possible. Clearly mark and make sure that you can see: Any grab bars or handrails. First and last steps. Where the edge of each step is. Use tools that help you move around (mobility aids) if they are needed. These include: Canes. Walkers. Scooters. Crutches. Turn on the lights when you go into a dark area. Replace any light bulbs as soon as they burn out. Set up your furniture so you have a clear path. Avoid moving your furniture around. If any of your floors are uneven, fix them. If there are any pets around you, be aware of where they are. Review your medicines with your doctor. Some medicines can make you feel dizzy. This can increase your chance of falling. Ask your doctor what other things that you can do to help prevent falls. This information is not intended to replace advice given to you by your health care provider. Make sure you discuss any questions you have with your health care provider. Document Released: 01/22/2009 Document Revised: 09/03/2015 Document Reviewed: 05/02/2014 Elsevier Interactive Patient Education  2017 [employer].

## 2021-04-21 NOTE — Progress Notes (Signed)
Subjective:   Debbie Marshall is a 76 y.o. female who presents for Medicare Annual (Subsequent) preventive examination.  Review of Systems    No ROS Cardiac Risk Factors include: advanced age (>4mn, >>42women);diabetes mellitus    Objective:    Today's Vitals   04/21/21 1347  Weight: 119 lb (54 kg)  Height: 4' 11"  (1.499 m)   Body mass index is 24.04 kg/m.  Advanced Directives 04/21/2021 03/17/2021 04/30/2020 12/30/2014 10/23/2013  Does Patient Have a Medical Advance Directive? No No No No Patient does not have advance directive  Would patient like information on creating a medical advance directive? No - Patient declined No - Patient declined No - Patient declined - -    Current Medications (verified) Outpatient Encounter Medications as of 04/21/2021  Medication Sig   ACCU-CHEK GUIDE test strip TEST TWICE DAILY   Accu-Chek Softclix Lancets lancets TEST TWICE DAILY AS  DIRECTED   Ascorbic Acid (VITAMIN C) 500 MG CAPS Take 1 capsule by mouth daily.   Blood Glucose Monitoring Suppl (ACCU-CHEK GUIDE) w/Device KIT USE TO CHECK BLOOD SUGAR  TWICE DAILY   Cholecalciferol 1000 UNITS capsule Take 1 capsule (1,000 Units total) by mouth daily.   cyanocobalamin 2000 MCG tablet Take 2,500 mcg by mouth daily.   fluticasone (FLONASE) 50 MCG/ACT nasal spray Place 2 sprays into both nostrils daily. (Patient not taking: Reported on 03/17/2021)   levothyroxine (SYNTHROID) 50 MCG tablet TAKE 1 TABLET BY MOUTH  DAILY   LUTEIN PO Take 40 mg by mouth daily. (Patient not taking: Reported on 03/17/2021)   metFORMIN (GLUCOPHAGE-XR) 500 MG 24 hr tablet TAKE 2 TABLETS BY MOUTH  DAILY   MULTIPLE VITAMIN PO Take 1 capsule by mouth daily.   Omega-3 Fatty Acids (FISH OIL) 435 MG CAPS Take 1 capsule by mouth in the morning and at bedtime.   simvastatin (ZOCOR) 40 MG tablet TAKE 1 TABLET BY MOUTH AT  BEDTIME   No facility-administered encounter medications on file as of 04/21/2021.    Allergies (verified) Patient  has no known allergies.   History: Past Medical History:  Diagnosis Date   ABNORMAL EXAM-BILIARY TRACT 11/14/2008   ABNORMAL TRANSAMINASE, (LFT'S) 11/14/2008    2005 [place]   Allergy    [place]   t[place] cancer   [place]    FASTING HYPERGLYCEMIA 09/07/2006   GERD 09/07/2006   HYPERLIPIDEMIA 09/07/2006   HYPOTHYROIDISM 10/30/2006   Nonspecific abnormal results of liver function study 09/07/2006   Normal nuclear stress test 2005   stress cardiolite    OSTEOARTHRITIS 09/07/2006   Positive PPD    received vaccine in home country- shows positive after receiving   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  Dr nLucia Gaskins  VITAMIN D DEFICIENCY 08/14/2009   Past Surgical History:  Procedure Laterality Date   COLONOSCOPY     NASAL POLYP SURGERY  04/16/2020   POLYPECTOMY     SALIVARY GLAND SURGERY  1996   Tumor removed on back of tongue and salivary gland   Family History  Problem Relation Age of Onset   Stroke Mother 63  Arthritis Mother    Rheum arthritis Brother    Arthritis Brother    Gout Brother    Colon cancer Other    Esophageal cancer Neg Hx    Stomach cancer Neg Hx    Rectal cancer Neg Hx    Colon polyps Neg Hx  Social History   Socioeconomic History   Marital status: Single    Spouse name: Not on file   Number of children: Not on file   Years of education: Not on file   Highest education level: Not on file  Occupational History   Occupation: PhD Professor  Tobacco Use   Smoking status: Never   Smokeless tobacco: Never  Vaping Use   Vaping Use: Never used  Substance and Sexual Activity   Alcohol use: Yes    Alcohol/week: 1.0 standard drink    Types: 1 Glasses of wine per week    Comment: rarely   Drug use: No   Sexual activity: Not on file  Other Topics Concern   Not on file  Social History Narrative   HH of 2 Father and her    cat     PhD professor in information systems    No  tobacco or alcohol exercising regularly currently   Social Determinants of Health   Financial Resource Strain: Low Risk    Difficulty of Paying Living Expenses: Not hard at all  Food Insecurity: No Food Insecurity   Worried About Charity fundraiser in the Last Year: Never true   Arboriculturist in the Last Year: Never true  Transportation Needs: No Transportation Needs   Lack of Transportation (Medical): No   Lack of Transportation (Non-Medical): No  Physical Activity: Insufficiently Active   Days of Exercise per Week: 3 days   Minutes of Exercise per Session: 30 min  Stress: No Stress Concern Present   Feeling of Stress : Not at all  Social Connections: Moderately Integrated   Frequency of Communication with Friends and Family: More than three times a week   Frequency of Social Gatherings with Friends and Family: More than three times a week   Attends Religious Services: More than 4 times per year   Active Member of [employer] or Organizations: Yes   Attends Music therapist: More than 4 times per year   Marital Status: Never married     Clinical Intake: Nutrition Risk Assessment:  Has the patient had any N/V/D within the last 2 months?  No  Does the patient have any non-healing wounds?  No  Has the patient had any unintentional weight loss or weight gain?  No   Diabetes:  Is the patient diabetic?  Yes Pre Diabetic If diabetic, was a CBG obtained today?  No  Did the patient bring in their glucometer from home?  No Audio Visit How often do you monitor your CBG's? Daily.   Financial Strains and Diabetes Management:  Are you having any financial strains with the device, your supplies or your medication? No .  Does the patient want to be seen by Chronic Care Management for management of their diabetes?  No  Would the patient like to be referred to a Nutritionist or for Diabetic Management?  No   Diabetic Exams:  Diabetic Eye Exam: Completed Yes.   Diabetic  Foot Exam: Completed Yes Followed by PCP. Pt has been advised   Interpreter Needed?: No Activities of Daily Living In your present state of health, do you have any difficulty performing the following activities: 04/21/2021 04/21/2021  Hearing? N N  Vision? N N  Difficulty concentrating or making decisions? N N  Walking or climbing stairs? N N  Dressing or bathing? N N  Doing errands, shopping? N N  Preparing Food and eating ? N N  Using the Toilet?  N N  In the past six months, have you accidently leaked urine? N N  Do you have problems with loss of bowel control? N N  Managing your Medications? N N  Managing your Finances? N N  Housekeeping or managing your Housekeeping? N N  Some recent data might be hidden    Patient Care Team: Panosh, Standley Brooking, MD as PCP - General Earnie Larsson, [facility] as Pharmacist (Pharmacist)  Indicate any recent Medical Services you may have received from other than [facility] providers in the past year (date may be approximate).     Assessment:   This is a routine wellness examination for Debbie Marshall. Virtual Visit via Telephone Note  I connected with  Debbie Marshall on 04/21/21 at  1:45 PM EST by telephone and verified that I am speaking with the correct person using two identifiers.  Location: Patient: Home Provider: Office Persons participating in the virtual visit: patient/Nurse Health Advisor   I discussed the limitations, risks, security and privacy concerns of performing an evaluation and management service by telephone and the availability of in person appointments. The patient expressed understanding and agreed to proceed.  Interactive audio and video telecommunications were attempted between this nurse and patient, however failed, due to patient having technical difficulties OR patient did not have access to video capability.  We continued and completed visit with audio only.  Some vital signs may be absent or patient reported.   Criselda Peaches, LPN   Hearing/Vision screen Hearing Screening - Comments:: No difficulty hearing  Vision Screening - Comments:: Wears glasses. Followed by [place] issues and exercise activities discussed: Current Exercise Habits: Home exercise routine, Time (Minutes): 30, Frequency (Times/Week): 3, Weekly Exercise (Minutes/Week): 90   Goals Addressed             This Visit's Progress    Exercise 3x per week (30 min per time)         Depression Screen PHQ 2/9 Scores 04/21/2021 03/08/2021 04/30/2020 02/26/2020 01/25/2019 12/21/2017 07/22/2016  PHQ - 2 Score 0 2 0 0 0 0 0  PHQ- 9 Score 0 4 0 - - - -    Fall Risk Fall Risk  04/21/2021 04/21/2021 04/30/2020 02/26/2020 01/25/2019  Falls in the past year? 0 0 0 0 0  Number falls in past yr: 0 0 0 0 0  Injury with Fall? 0 0 0 0 0  Risk for fall due to : - - No Fall Risks - -  Follow up - - Falls evaluation completed;Falls prevention discussed - Falls evaluation completed    FALL RISK PREVENTION PERTAINING TO THE HOME:  Any stairs in or around the home? No  If so, are there any without handrails? No  Home free of loose throw rugs in walkways, pet beds, electrical cords, etc? Yes  Adequate lighting in your home to reduce risk of falls? No   ASSISTIVE DEVICES UTILIZED TO PREVENT FALLS:  Life alert? No  Use of a cane, walker or w/c? No  Grab bars in the bathroom? Yes  Shower chair or bench in shower? Yes  Elevated toilet seat or a handicapped toilet? Yes   TIMED UP AND GO:  Was the test performed? No . Audio Visit  Cognitive Function:     Immunizations Immunization History  Administered Date(s) Administered   Fluad Quad(high Dose 65+) 01/25/2019, 02/26/2020, 03/01/2021   Influenza Split 04/24/2012   Influenza Whole 02/19/2010   Influenza, High Dose Seasonal PF 04/02/2015, 05/27/2016,  12/06/2016, 12/21/2017   Influenza,inj,Quad PF,6+ Mos 04/18/2013, 02/27/2014   Influenza,inj,quad, With Preservative 01/07/2019   PFIZER(Purple  Top)SARS-COV-2 Vaccination 06/20/2019, 07/10/2019, 03/11/2020   PPD Test 07/02/2018   Pneumococcal Conjugate-13 11/03/2014   Pneumococcal Polysaccharide-23 10/24/2012   Tdap 06/12/2013   Zoster, Live 06/12/2013   Covid-19 vaccine status: Information provided on how to obtain vaccines.   Qualifies for Shingles Vaccine? Yes   Zostavax completed No   Shingrix Completed?: No.    Education has been provided regarding the importance of this vaccine. Patient has been advised to call insurance company to determine out of pocket expense if they have not yet received this vaccine. Advised may also receive vaccine at local pharmacy or Health Dept. Verbalized acceptance and understanding.  Screening Tests Health Maintenance  Topic Date Due   COVID-19 Vaccine (4 - Booster for Pfizer series) 05/07/2021 (Originally 05/06/2020)   Zoster Vaccines- Shingrix (1 of 2) 07/20/2021 (Originally 06/13/1964)   OPHTHALMOLOGY EXAM  07/29/2021   HEMOGLOBIN A1C  08/29/2021   URINE MICROALBUMIN  03/01/2022   FOOT EXAM  03/08/2022   MAMMOGRAM  07/08/2022   TETANUS/TDAP  06/13/2023   COLONOSCOPY (Pts 45-10yr Insurance coverage will need to be confirmed)  09/23/2023   Pneumonia Vaccine 76 Years old  Completed   INFLUENZA VACCINE  Completed   DEXA SCAN  Completed   Hepatitis C Screening  Completed   HPV VACCINES  Aged Out    Health Maintenance  There are no preventive care reminders to display for this patient.   Additional Screening:   Vision Screening: Recommended annual ophthalmology exams for early detection of glaucoma and other disorders of the eye. Is the patient up to date with their annual eye exam?  Yes  Who is the provider or what is the name of the office in which the patient attends annual eye exams? Followed     Dental Screening: Recommended annual dental exams for proper oral hygiene  Community Resource Referral / Chronic Care Management:  CRR required this visit?  No   CCM required  this visit?  No      Plan:     I have personally reviewed and noted the following in the patients chart:   Medical and social history Use of alcohol, tobacco or illicit drugs  Current medications and supplements including opioid prescriptions. Patient currently not taking opioids Functional ability and status Nutritional status Physical activity Advanced directives List of other physicians Hospitalizations, surgeries, and ER visits in previous 12 months Vitals Screenings to include cognitive, depression, and falls Referrals and appointments  In addition, I have reviewed and discussed with patient certain preventive protocols, quality metrics, and best practice recommendations. A written personalized care plan for preventive services as well as general preventive health recommendations were provided to patient.     BCriselda Peaches LPN   13/29/9242

## 2021-04-22 ENCOUNTER — Ambulatory Visit (HOSPITAL_BASED_OUTPATIENT_CLINIC_OR_DEPARTMENT_OTHER): Payer: Medicare Other | Admitting: Physical Therapy

## 2021-05-04 ENCOUNTER — Ambulatory Visit (HOSPITAL_BASED_OUTPATIENT_CLINIC_OR_DEPARTMENT_OTHER): Payer: Medicare Other | Admitting: Physical Therapy

## 2021-05-04 ENCOUNTER — Other Ambulatory Visit: Payer: Self-pay

## 2021-05-04 ENCOUNTER — Encounter (HOSPITAL_BASED_OUTPATIENT_CLINIC_OR_DEPARTMENT_OTHER): Payer: Self-pay | Admitting: Physical Therapy

## 2021-05-04 DIAGNOSIS — M25611 Stiffness of right shoulder, not elsewhere classified: Secondary | ICD-10-CM | POA: Diagnosis not present

## 2021-05-04 DIAGNOSIS — G8929 Other chronic pain: Secondary | ICD-10-CM

## 2021-05-04 DIAGNOSIS — M25511 Pain in right shoulder: Secondary | ICD-10-CM | POA: Diagnosis not present

## 2021-05-04 DIAGNOSIS — M6281 Muscle weakness (generalized): Secondary | ICD-10-CM | POA: Diagnosis not present

## 2021-05-04 NOTE — Therapy (Signed)
OUTPATIENT PHYSICAL THERAPY TREATMENT NOTE   Patient Name: Debbie Marshall MRN: [id number] DOB:08-20-1945, 76 y.o., female Today's Date: 05/04/2021  PCP: Burnis Medin, MD REFERRING PROVIDER: Burnis Medin, MD   PT End of Session - 05/04/21 1300     Visit Number 9    Number of Visits 22    Authorization Type U[employer] Medicare    PT Start Time 1300    PT Stop Time 1340    PT Time Calculation (min) 40 min    Activity Tolerance Patient tolerated treatment well;No increased pain    Behavior During Therapy WFL for tasks assessed/performed                Past Medical History:  Diagnosis Date   ABNORMAL EXAM-BILIARY TRACT 11/14/2008   ABNORMAL TRANSAMINASE, (LFT'S) 11/14/2008    2005 US shows fatty liver   Allergy    Cancer ([place]) 1996   tongue cancer   Diabetes mellitus without complication ([place])    FASTING HYPERGLYCEMIA 09/07/2006   GERD 09/07/2006   HYPERLIPIDEMIA 09/07/2006   HYPOTHYROIDISM 10/30/2006   Nonspecific abnormal results of liver function study 09/07/2006   Normal nuclear stress test 2005   stress cardiolite    OSTEOARTHRITIS 09/07/2006   Positive PPD    [place] vaccine [place]- shows positive after receiving   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  Dr Lucia Gaskins   VITAMIN D DEFICIENCY 08/14/2009   Past Surgical History:  Procedure Laterality Date   COLONOSCOPY     NASAL POLYP SURGERY  04/16/2020   POLYPECTOMY     SALIVARY GLAND SURGERY  1996   Tumor removed on back of tongue and salivary gland   Patient Active Problem List   Diagnosis Date Noted   Hip crepitus 02/27/2014   Visit for preventive health examination 10/28/2013   Nocturia 10/28/2013   Osteopenia 11/15/2012   AC (acromioclavicular) arthritis 11/10/2011   Cubital tunnel syndrome on right 10/14/2011   Arm pain, right 10/14/2011   Arthritis 10/05/2010   Physical exam, annual 06/02/2010   Diabetes mellitus type 2, uncomplicated ([place]) 47/12/6281   Positive PPD 06/02/2010    VITAMIN D DEFICIENCY 08/14/2009   ABNORMAL EXAM-BILIARY TRACT 11/14/2008   PAIN IN JOINT, HAND 07/22/2008   Hypothyroidism 10/30/2006   Hyperlipidemia 09/07/2006   GERD 09/07/2006   OSTEOARTHRITIS 09/07/2006   FASTING HYPERGLYCEMIA 09/07/2006      PCP: Burnis Medin, MD   REFERRING PROVIDER: Burnis Medin, MD   REFERRING DIAG: [phone] (ICD-10-CM) - Chronic right shoulder pain   THERAPY DIAG:  Decreased right shoulder range of motion   Chronic right shoulder pain   Muscle weakness (generalized)     ONSET DATE: 06/2020   SUBJECTIVE:  SUBJECTIVE STATEMENT: Pt states that the shoulder is getting much better. She states that reaching IR BHB is better and without pain but still stiff and limited. Reaching cross body is not as painful anymore.    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 Yes VAS scale: 0/10 posteriorly Pain location: R shoulder   PAIN TYPE: aching Pain description: intermittent  Aggravating factors: reaching behind back, dressing, keyboarding Relieving factors: patches, topical ointments, tylenol    PRECAUTIONS: None   PATIENT GOALS : Pt states she would like to reach behind back normally without pain.    OBJECTIVE:     DIAGNOSTIC FINDINGS:  FINDINGS: There is no evidence of fracture or dislocation. There is no evidence of arthropathy or other focal bone abnormality. Soft tissues are unremarkable.   IMPRESSION: Negative.   Diagnostic Limited MSK Ultrasound of: Right shoulder Biceps tendon is intact in bicipital groove.  Small amount of hypoechoic fluid tracks within biceps tendon sheath. Subscapularis tendon is intact and normal appearing Supraspinatus tendon is intact. Mild  subacromial bursitis is present. Infraspinatus tendon is intact. AC joint degenerative appearing Impression: Subacromial bursitis   PATIENT SURVEYS:  FOTO 55 63 D/C 5 pts MCII   6th visit FOTO 1/3: 73 pts            TODAY'S TREATMENT:    Joint mobs: Inf and post R GHJ grade IV in open pack supine ant GHJ mob grade III;   Exercises  Sleeper stretch 30s 3x S/L ER 2lbs 3x10 Shoulder extension RTB 2x10 Shoulder IR GTB 2x10 IR strap stretch 10s 10x Prone ABD 3x10  Review of HEP updated           PATIENT EDUCATION: Education details: anatomy, exercise progression, acceptable pain levels, HEP updates, ROM increases Person educated: Patient Education method: Explanation, Demonstration, Tactile cues, Verbal cues, and Handouts Education comprehension: verbalized understanding, returned demonstration, verbal cues required, and tactile cues required     HOME EXERCISE PROGRAM: Access Code: [email or web address] URL: [email or web address] Date: 05/04/2021 Prepared by: Daleen Bo  Exercises Seated Shoulder Flexion Towel Slide at Table Top - 2 x daily - 7 x weekly - 1 sets - 10 reps - 5 hold Sleeper Stretch - 2 x daily - 7 x weekly - 1 sets - 10 reps - 10 hold Sidelying Open Book - 2 x daily - 7 x weekly - 2 sets - 10 reps - 5 hold Shoulder extension with resistance - Neutral - 1 x daily - 7 x weekly - 3 sets - 10 reps Standing Shoulder Extension with Dowel - 1 x daily - 7 x weekly - 2 sets - 10 reps - 5 hold           ASSESSMENT:   CLINICAL IMPRESSION: Pt  with improvement in IR ROM at today's session with reach to L5 without pain but continues to demonstrate end range stiffness. Pt's flexion and ABD are within 75% of L UE at this time. Pt able to add in shoulder cuff strengthening at today's session without pain or increase in stiffness. Hold on adding to HEP in order to assess response. Pt likely able to decrease frequency at this time given progress. Plan to add IR  strap stretch to HEP, if no increase in pain after today's session.    Objective impairments include decreased ROM, decreased strength, hypomobility, increased muscle spasms, impaired flexibility, impaired UE functional use, improper body mechanics, postural dysfunction, and  pain. These impairments are limiting patient from cleaning, community activity, driving, meal prep, occupation, laundry, yard work, shopping, and caregiver duties . Personal factors including Age, Fitness, Sex, Time since onset of injury/illness/exacerbation, and 1-2 comorbidities:    are also affecting patient's functional outcome. Patient will benefit from skilled PT to address above impairments and improve overall function.   REHAB POTENTIAL: Good   CLINICAL DECISION MAKING: Stable/uncomplicated   EVALUATION COMPLEXITY: Low     GOALS:     SHORT TERM GOALS:   STG Name Target Date Goal status  1 Pt will become independent with HEP in order to demonstrate synthesis of PT education.   Baseline:  04/08/2021 MET  2 Pt will report at least 2 pt reduction on NPRS scale for pain in order to demonstrate functional improvement with household activity, self care, and ADL.   Baseline:  05/06/2021 MET  3 Pt will be able to demonstrate full R OH ROM without pain in order to demonstrate functional improvement in UE function for self-care and house hold duties.  Baseline: 05/06/2021 ongoing    LONG TERM GOALS:    LTG Name Target Date Goal status  1 Pt  will become independent with final HEP in order to demonstrate synthesis of PT education.   Baseline: 06/17/2021 ongoing  2 Pt will be able to reach [facility] and carry/hold >5 lbs in order to demonstrate functional improvement in L UE strength for return to PLOF and exercise.   Baseline: 06/17/2021 ongoing  3 Pt will be able to demonstrate BHB IR reach without pain in order to demonstrate functional improvement in UE function for self-care and house hold duties.   06/17/2021 Partially met   4 Pt will score >/= 63 on FOTO to demonstrate functional improvement in shoulder pain.   Baseline: 06/17/2021 MET    PLAN: PT FREQUENCY: 1-2x/week   PT DURATION: 12 weeks (likely DC by 8 weeks)   PLANNED INTERVENTIONS: Therapeutic exercises, Therapeutic activity, Neuro Muscular re-education, Balance training, Gait training, Patient/Family education, Joint mobilization, Aquatic Therapy, Dry Needling, Electrical stimulation, Spinal mobilization, Cryotherapy, Moist heat, Compression bandaging, scar mobilization, Taping, Vasopneumatic device, Traction, Ultrasound, Ionotophoresis 78m/ml Dexamethasone, and Manual therapy   PLAN FOR NEXT SESSION:  joint mobilizations; progress scap strength and posterior shoulder strength, add more aggressive IR and   ADaleen BoPT, DPT 05/04/21 2:03 PM

## 2021-05-11 ENCOUNTER — Ambulatory Visit (HOSPITAL_BASED_OUTPATIENT_CLINIC_OR_DEPARTMENT_OTHER): Payer: Medicare Other | Admitting: Physical Therapy

## 2021-05-11 ENCOUNTER — Encounter (HOSPITAL_BASED_OUTPATIENT_CLINIC_OR_DEPARTMENT_OTHER): Payer: Self-pay | Admitting: Physical Therapy

## 2021-05-11 ENCOUNTER — Encounter (HOSPITAL_BASED_OUTPATIENT_CLINIC_OR_DEPARTMENT_OTHER): Payer: Medicare Other | Admitting: Physical Therapy

## 2021-05-11 ENCOUNTER — Other Ambulatory Visit: Payer: Self-pay

## 2021-05-11 DIAGNOSIS — M25511 Pain in right shoulder: Secondary | ICD-10-CM | POA: Diagnosis not present

## 2021-05-11 DIAGNOSIS — G8929 Other chronic pain: Secondary | ICD-10-CM | POA: Diagnosis not present

## 2021-05-11 DIAGNOSIS — M6281 Muscle weakness (generalized): Secondary | ICD-10-CM

## 2021-05-11 DIAGNOSIS — M25611 Stiffness of right shoulder, not elsewhere classified: Secondary | ICD-10-CM | POA: Diagnosis not present

## 2021-05-11 NOTE — Therapy (Signed)
OUTPATIENT PHYSICAL THERAPY PROGRESS NOTE  Progress Note Reporting Period 03/17/2021 to 05/11/21   See note below for Objective Data and Assessment of Progress/Goals.       Patient Name: Debbie Marshall MRN: [id number] DOB:January 13, 1946, 76 y.o., female Today's Date: 05/11/2021  PCP: Burnis Medin, MD REFERRING PROVIDER: Burnis Medin, MD   PT End of Session - 05/11/21 1158     Visit Number 10    Number of Visits 22    Authorization Type UHC Medicare    PT Start Time 1150    PT Stop Time 1230    PT Time Calculation (min) 40 min    Activity Tolerance Patient tolerated treatment well;No increased pain    Behavior During Therapy WFL for tasks assessed/performed                 Past Medical History:  Diagnosis Date   ABNORMAL EXAM-BILIARY TRACT 11/14/2008   ABNORMAL TRANSAMINASE, (LFT'S) 11/14/2008    2005 US shows fatty liver   Allergy    Cancer ([place]) 1996   tongue cancer   Diabetes mellitus without complication ([place])    FASTING HYPERGLYCEMIA 09/07/2006   GERD 09/07/2006   HYPERLIPIDEMIA 09/07/2006   HYPOTHYROIDISM 10/30/2006   Nonspecific abnormal results of liver function study 09/07/2006   Normal nuclear stress test 2005   stress cardiolite    OSTEOARTHRITIS 09/07/2006   Positive PPD    [place]- shows positive after receiving   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  Dr Lucia Gaskins   VITAMIN D DEFICIENCY 08/14/2009   Past Surgical History:  Procedure Laterality Date   COLONOSCOPY     NASAL POLYP SURGERY  04/16/2020   POLYPECTOMY     SALIVARY GLAND SURGERY  1996   Tumor removed on back of tongue and salivary gland   Patient Active Problem List   Diagnosis Date Noted   Hip crepitus 02/27/2014   Visit for preventive health examination 10/28/2013   Nocturia 10/28/2013   Osteopenia 11/15/2012   AC (acromioclavicular) arthritis 11/10/2011   Cubital tunnel syndrome on right 10/14/2011   Arm pain, right 10/14/2011   Arthritis  10/05/2010   Physical exam, annual 06/02/2010   Diabetes mellitus type 2, uncomplicated ([place]) 99/37/1696   Positive PPD 06/02/2010   VITAMIN D DEFICIENCY 08/14/2009   ABNORMAL EXAM-BILIARY TRACT 11/14/2008   PAIN IN JOINT, HAND 07/22/2008   Hypothyroidism 10/30/2006   Hyperlipidemia 09/07/2006   GERD 09/07/2006   OSTEOARTHRITIS 09/07/2006   FASTING HYPERGLYCEMIA 09/07/2006      PCP: Burnis Medin, MD   REFERRING PROVIDER: Burnis Medin, MD   REFERRING DIAG: [phone] (ICD-10-CM) - Chronic right shoulder pain   THERAPY DIAG:  Decreased right shoulder range of motion   Chronic right shoulder pain   Muscle weakness (generalized)     ONSET DATE: 06/2020   SUBJECTIVE:  SUBJECTIVE STATEMENT: Pt states she has had pain in the shoulder for 1-2 days after doing exercises. During exercise, it does not bother her. The pain only last about 1-2 days after and feels sharp.    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 No VAS scale: 0/10 posteriorly Pain location: R shoulder   PAIN TYPE: aching Pain description: intermittent  Aggravating factors: reaching behind back, dressing, keyboarding Relieving factors: patches, topical ointments, tylenol    PRECAUTIONS: None   PATIENT GOALS : Pt states she would like to reach behind back normally without pain.    OBJECTIVE:     DIAGNOSTIC FINDINGS:  FINDINGS: There is no evidence of fracture or dislocation. There is no evidence of arthropathy or other focal bone abnormality. Soft tissues are unremarkable.   IMPRESSION: Negative.   Diagnostic Limited MSK Ultrasound of: Right shoulder Biceps tendon is intact in bicipital groove.  Small amount of hypoechoic fluid tracks  within biceps tendon sheath. Subscapularis tendon is intact and normal appearing Supraspinatus tendon is intact. Mild subacromial bursitis is present. Infraspinatus tendon is intact. AC joint degenerative appearing Impression: Subacromial bursitis   PATIENT SURVEYS:  FOTO 55 63 D/C 5 pts MCII   6th visit FOTO 1/3: 73 pts   A/PROM Right 03/17/2021 Left 03/17/2021 1/31 R  Shoulder flexion 120 p! 150 141   Shoulder extension 30 45 45  Shoulder abduction 110 p! 150 143  Shoulder adduction       Shoulder internal rotation BHB reach to glute p! WFL reach L2 BHB  Shoulder external rotation 40 55 50  (Blank rows = not tested)   UPPER EXTREMITY MMT:   MMT Right 03/17/2021 Left 03/17/2021  Shoulder flexion 4/5 4+/5  Shoulder extension      Shoulder abduction 4/5 4+/5  Shoulder adduction 4/5 4+/5  IR 4/5 4+/5  (Blank rows = not tested)             TODAY'S TREATMENT:    Joint mobs: Inf and post R GHJ grade IV in open pack;  prone ant GHJ mob grade III;   Exercises  Sleeper stretch 30s 3x S/L ER 2lbs 3x10 Blue TB rowing 2x10 IR strap stretch 10s 10x (2 sets) Standing ABD 2x10 1lbs  Review of HEP updated    PATIENT EDUCATION: Education details: anatomy, exercise progression, acceptable pain levels, HEP updates, ROM increases Person educated: Patient Education method: Explanation, Demonstration, Tactile cues, Verbal cues, and Handouts Education comprehension: verbalized understanding, returned demonstration, verbal cues required, and tactile cues required     HOME EXERCISE PROGRAM: Access Code: [email or web address] URL: [email or web address] Date: 05/11/2021 Prepared by: Daleen Bo  Exercises Seated Shoulder Flexion Towel Slide at Table Top - 2 x daily - 7 x weekly - 1 sets - 10 reps - 5 hold Sleeper Stretch - 2 x daily - 7 x weekly - 1 sets - 10 reps - 10 hold Standing Shoulder Row with Anchored Resistance - 1 x daily - 7 x weekly - 3 sets - 10 reps Shoulder  Abduction with Dumbbells - Thumbs Up - 1 x daily - 7 x weekly - 2 sets - 10 reps Standing Shoulder Internal Rotation Stretch with Dowel - 1 x daily - 7 x weekly - 2 sets - 10 reps - 5 hold    ASSESSMENT:   CLINICAL IMPRESSION: Pt demonstrates significant improvement with R shoulder function as demonstrated by objective measures. Pt able to now reach  OH without pain and without pain at rest. However, pt does continues to have strength deficits with isolated RTC movements. HEP updated to prioritize IR ROM deficits as well as isolated R shoulder strength. Plan to continue with ROM and strength as tolerated.    Objective impairments include decreased ROM, decreased strength, hypomobility, increased muscle spasms, impaired flexibility, impaired UE functional use, improper body mechanics, postural dysfunction, and pain. These impairments are limiting patient from cleaning, community activity, driving, meal prep, occupation, laundry, yard work, shopping, and caregiver duties . Personal factors including Age, Fitness, Sex, Time since onset of injury/illness/exacerbation, and 1-2 comorbidities:    are also affecting patient's functional outcome. Patient will benefit from skilled PT to address above impairments and improve overall function.   REHAB POTENTIAL: Good   CLINICAL DECISION MAKING: Stable/uncomplicated   EVALUATION COMPLEXITY: Low     GOALS:     SHORT TERM GOALS:   STG Name Target Date Goal status  1 Pt will become independent with HEP in order to demonstrate synthesis of PT education.   Baseline:  04/08/2021 MET  2 Pt will report at least 2 pt reduction on NPRS scale for pain in order to demonstrate functional improvement with household activity, self care, and ADL.   Baseline:  05/06/2021 MET  3 Pt will be able to demonstrate full R OH ROM without pain in order to demonstrate functional improvement in UE function for self-care and house hold duties.  Baseline: 05/06/2021 ongoing     LONG TERM GOALS:    LTG Name Target Date Goal status  1 Pt  will become independent with final HEP in order to demonstrate synthesis of PT education.   Baseline: 06/17/2021 ongoing  2 Pt will be able to reach [facility] and carry/hold >5 lbs in order to demonstrate functional improvement in L UE strength for return to PLOF and exercise.   Baseline: 06/17/2021 ongoing  3 Pt will be able to demonstrate BHB IR reach without pain in order to demonstrate functional improvement in UE function for self-care and house hold duties.   06/17/2021 Partially met  4 Pt will score >/= 63 on FOTO to demonstrate functional improvement in shoulder pain.   Baseline: 06/17/2021 MET    PLAN: PT FREQUENCY: 1-2x/week   PT DURATION: 12 weeks (likely DC by 8 weeks)   PLANNED INTERVENTIONS: Therapeutic exercises, Therapeutic activity, Neuro Muscular re-education, Balance training, Gait training, Patient/Family education, Joint mobilization, Aquatic Therapy, Dry Needling, Electrical stimulation, Spinal mobilization, Cryotherapy, Moist heat, Compression bandaging, scar mobilization, Taping, Vasopneumatic device, Traction, Ultrasound, Ionotophoresis 21m/ml Dexamethasone, and Manual therapy   PLAN FOR NEXT SESSION:  joint mobilizations; review scap strength and posterior shoulder strength, add more aggressive IR and   ADaleen BoPT, DPT 05/11/21 12:44 PM

## 2021-05-18 ENCOUNTER — Other Ambulatory Visit: Payer: Self-pay

## 2021-05-18 ENCOUNTER — Encounter (HOSPITAL_BASED_OUTPATIENT_CLINIC_OR_DEPARTMENT_OTHER): Payer: Self-pay | Admitting: Physical Therapy

## 2021-05-18 ENCOUNTER — Ambulatory Visit (HOSPITAL_BASED_OUTPATIENT_CLINIC_OR_DEPARTMENT_OTHER): Payer: Medicare Other | Attending: Family Medicine | Admitting: Physical Therapy

## 2021-05-18 DIAGNOSIS — M25611 Stiffness of right shoulder, not elsewhere classified: Secondary | ICD-10-CM | POA: Insufficient documentation

## 2021-05-18 DIAGNOSIS — M25511 Pain in right shoulder: Secondary | ICD-10-CM | POA: Diagnosis not present

## 2021-05-18 DIAGNOSIS — M6281 Muscle weakness (generalized): Secondary | ICD-10-CM | POA: Insufficient documentation

## 2021-05-18 DIAGNOSIS — G8929 Other chronic pain: Secondary | ICD-10-CM | POA: Insufficient documentation

## 2021-05-18 NOTE — Therapy (Signed)
OUTPATIENT PHYSICAL THERAPY TREATMENT NOTE       Patient Name: Debbie Marshall MRN: [id number] DOB:1945/12/07, 76 y.o., female Today's Date: 05/18/2021  PCP: Burnis Medin, MD REFERRING PROVIDER: Burnis Medin, MD   PT End of Session - 05/18/21 1122     Visit Number 11    Number of Visits 22    Date for PT Re-Evaluation 06/15/21    Authorization Type UHC Medicare    PT Start Time 1100    PT Stop Time 1140    PT Time Calculation (min) 40 min    Activity Tolerance Patient tolerated treatment well;No increased pain    Behavior During Therapy WFL for tasks assessed/performed                  Past Medical History:  Diagnosis Date   ABNORMAL EXAM-BILIARY TRACT 11/14/2008   ABNORMAL TRANSAMINASE, (LFT'S) 11/14/2008    2005 US shows fatty liver   Allergy    Cancer ([place]) 1996   tongue cancer   Diabetes mellitus without complication ([place])    FASTING HYPERGLYCEMIA 09/07/2006   GERD 09/07/2006   HYPERLIPIDEMIA 09/07/2006   HYPOTHYROIDISM 10/30/2006   Nonspecific abnormal results of liver function study 09/07/2006   Normal nuclear stress test 2005   stress cardiolite    OSTEOARTHRITIS 09/07/2006   Positive PPD    [place] vaccine [place]- shows positive after receiving   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  Dr Lucia Gaskins   VITAMIN D DEFICIENCY 08/14/2009   Past Surgical History:  Procedure Laterality Date   COLONOSCOPY     NASAL POLYP SURGERY  04/16/2020   POLYPECTOMY     SALIVARY GLAND SURGERY  1996   Tumor removed on back of tongue and salivary gland   Patient Active Problem List   Diagnosis Date Noted   Hip crepitus 02/27/2014   Visit for preventive health examination 10/28/2013   Nocturia 10/28/2013   Osteopenia 11/15/2012   AC (acromioclavicular) arthritis 11/10/2011   Cubital tunnel syndrome on right 10/14/2011   Arm pain, right 10/14/2011   Arthritis 10/05/2010   Physical exam, annual 06/02/2010   Diabetes mellitus type 2, uncomplicated  ([place]) 86/82/5749   Positive PPD 06/02/2010   VITAMIN D DEFICIENCY 08/14/2009   ABNORMAL EXAM-BILIARY TRACT 11/14/2008   PAIN IN JOINT, HAND 07/22/2008   Hypothyroidism 10/30/2006   Hyperlipidemia 09/07/2006   GERD 09/07/2006   OSTEOARTHRITIS 09/07/2006   FASTING HYPERGLYCEMIA 09/07/2006      PCP: Burnis Medin, MD   REFERRING PROVIDER: Burnis Medin, MD   REFERRING DIAG: [id number] (ICD-10-CM) - Chronic right shoulder pain   THERAPY DIAG:  Decreased right shoulder range of motion   Chronic right shoulder pain   Muscle weakness (generalized)     ONSET DATE: 06/2020   SUBJECTIVE:  SUBJECTIVE STATEMENT: Pt states her shoulder was sore after the IR stretching but today it feels better. 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 No VAS scale: 2-3/10 posteriorly Pain location: R shoulder   PAIN TYPE: aching Pain description: intermittent  Aggravating factors: reaching behind back, dressing, keyboarding Relieving factors: patches, topical ointments, tylenol    PRECAUTIONS: None   PATIENT GOALS : Pt states she would like to reach behind back normally without pain.    OBJECTIVE:     DIAGNOSTIC FINDINGS:  FINDINGS: There is no evidence of fracture or dislocation. There is no evidence of arthropathy or other focal bone abnormality. Soft tissues are unremarkable.   IMPRESSION: Negative.   Diagnostic Limited MSK Ultrasound of: Right shoulder Biceps tendon is intact in bicipital groove.  Small amount of hypoechoic fluid tracks within biceps tendon sheath. Subscapularis tendon is intact and normal appearing Supraspinatus tendon is intact. Mild subacromial bursitis is  present. Infraspinatus tendon is intact. AC joint degenerative appearing Impression: Subacromial bursitis   PATIENT SURVEYS:  FOTO 55 63 D/C 5 pts MCII   6th visit FOTO 1/3: 73 pts             TODAY'S TREATMENT:    Joint mobs: Inf and post R GHJ grade IV in open pack;  prone ant GHJ mob grade III; STM R infra   Exercises UBE L1.5 3 min fwd and retro Seated T/S ext with towel roll 5s 10x YTB horizontal ABD 2x10 Diagonals/stars 3x10 Standing ABD 2x10 1lbs Doorway pec stretch 30s 2x  Review of HEP updated    PATIENT EDUCATION: Education details: anatomy, exercise progression, acceptable pain levels, HEP updates, ROM increases Person educated: Patient Education method: Explanation, Demonstration, Tactile cues, Verbal cues, and Handouts Education comprehension: verbalized understanding, returned demonstration, verbal cues required, and tactile cues required     HOME EXERCISE PROGRAM: Access Code: [email or web address] URL: [email or web address] Date: 05/11/2021 Prepared by: Daleen Bo  Exercises Seated Shoulder Flexion Towel Slide at Table Top - 2 x daily - 7 x weekly - 1 sets - 10 reps - 5 hold Sleeper Stretch - 2 x daily - 7 x weekly - 1 sets - 10 reps - 10 hold Standing Shoulder Row with Anchored Resistance - 1 x daily - 7 x weekly - 3 sets - 10 reps Shoulder Abduction with Dumbbells - Thumbs Up - 1 x daily - 7 x weekly - 2 sets - 10 reps Standing Shoulder Internal Rotation Stretch with Dowel - 1 x daily - 7 x weekly - 2 sets - 10 reps - 5 hold    ASSESSMENT:   CLINICAL IMPRESSION: Pt presents with increase soft tissue restriction in the post shoulder today that may be result of more aggressive posterior shoulder stretching. Pt's pain is moderately sensitive to touch but not sensitive to movement. Pt able to continue with ROM exercise and more resisted exercise at today's session. Pt does continue to show IR deficits though improved after active warm up and joint  mob. Plan to continue with ROM and strength as tolerated.    Objective impairments include decreased ROM, decreased strength, hypomobility, increased muscle spasms, impaired flexibility, impaired UE functional use, improper body mechanics, postural dysfunction, and pain. These impairments are limiting patient from cleaning, community activity, driving, meal prep, occupation, laundry, yard work, shopping, and caregiver duties . Personal factors including Age, Fitness, Sex, Time  since onset of injury/illness/exacerbation, and 1-2 comorbidities:    are also affecting patient's functional outcome. Patient will benefit from skilled PT to address above impairments and improve overall function.   REHAB POTENTIAL: Good   CLINICAL DECISION MAKING: Stable/uncomplicated   EVALUATION COMPLEXITY: Low     GOALS:     SHORT TERM GOALS:   STG Name Target Date Goal status  1 Pt will become independent with HEP in order to demonstrate synthesis of PT education.   Baseline:  04/08/2021 MET  2 Pt will report at least 2 pt reduction on NPRS scale for pain in order to demonstrate functional improvement with household activity, self care, and ADL.   Baseline:  05/06/2021 MET  3 Pt will be able to demonstrate full R OH ROM without pain in order to demonstrate functional improvement in UE function for self-care and house hold duties.  Baseline: 05/06/2021 ongoing    LONG TERM GOALS:    LTG Name Target Date Goal status  1 Pt  will become independent with final HEP in order to demonstrate synthesis of PT education.   Baseline: 06/17/2021 ongoing  2 Pt will be able to reach [facility] and carry/hold >5 lbs in order to demonstrate functional improvement in L UE strength for return to PLOF and exercise.   Baseline: 06/17/2021 ongoing  3 Pt will be able to demonstrate BHB IR reach without pain in order to demonstrate functional improvement in UE function for self-care and house hold duties.   06/17/2021 Partially met  4 Pt  will score >/= 63 on FOTO to demonstrate functional improvement in shoulder pain.   Baseline: 06/17/2021 MET    PLAN: PT FREQUENCY: 1-2x/week   PT DURATION: 12 weeks (likely DC by 8 weeks)   PLANNED INTERVENTIONS: Therapeutic exercises, Therapeutic activity, Neuro Muscular re-education, Balance training, Gait training, Patient/Family education, Joint mobilization, Aquatic Therapy, Dry Needling, Electrical stimulation, Spinal mobilization, Cryotherapy, Moist heat, Compression bandaging, scar mobilization, Taping, Vasopneumatic device, Traction, Ultrasound, Ionotophoresis 19m/ml Dexamethasone, and Manual therapy   PLAN FOR NEXT SESSION:  joint mobilizations; review scap strength and posterior shoulder strength, add more aggressive IR and   ADaleen BoPT, DPT 05/18/21 11:45 AM

## 2021-05-25 ENCOUNTER — Ambulatory Visit (HOSPITAL_BASED_OUTPATIENT_CLINIC_OR_DEPARTMENT_OTHER): Payer: Medicare Other | Admitting: Physical Therapy

## 2021-05-25 ENCOUNTER — Encounter (HOSPITAL_BASED_OUTPATIENT_CLINIC_OR_DEPARTMENT_OTHER): Payer: Self-pay | Admitting: Physical Therapy

## 2021-05-25 ENCOUNTER — Other Ambulatory Visit: Payer: Self-pay

## 2021-05-25 DIAGNOSIS — M6281 Muscle weakness (generalized): Secondary | ICD-10-CM

## 2021-05-25 DIAGNOSIS — M25611 Stiffness of right shoulder, not elsewhere classified: Secondary | ICD-10-CM

## 2021-05-25 DIAGNOSIS — G8929 Other chronic pain: Secondary | ICD-10-CM

## 2021-05-25 DIAGNOSIS — M25511 Pain in right shoulder: Secondary | ICD-10-CM | POA: Diagnosis not present

## 2021-05-25 NOTE — Therapy (Signed)
OUTPATIENT PHYSICAL THERAPY TREATMENT NOTE       Patient Name: Debbie Marshall MRN: [id number] DOB:[birth year], 76 y.o., female Today's Date: 05/25/2021  PCP: Burnis Medin, MD REFERRING PROVIDER: Burnis Medin, MD   PT End of Session - 05/25/21 1113     Visit Number 12    Number of Visits 22    Date for PT Re-Evaluation 06/15/21    Authorization Type UHC Medicare    PT Start Time 1110   pt arrives late   PT Stop Time 1145    PT Time Calculation (min) 35 min    Activity Tolerance Patient tolerated treatment well;No increased pain    Behavior During Therapy WFL for tasks assessed/performed                   Past Medical History:  Diagnosis Date   ABNORMAL EXAM-BILIARY TRACT 11/14/2008   ABNORMAL TRANSAMINASE, (LFT'S) 11/14/2008    2005 US shows fatty liver   Allergy    Cancer ([place]) 1996   tongue cancer   Diabetes mellitus without complication ([place])    FASTING HYPERGLYCEMIA 09/07/2006   GERD 09/07/2006   HYPERLIPIDEMIA 09/07/2006   HYPOTHYROIDISM 10/30/2006   Nonspecific abnormal results of liver function study 09/07/2006   Normal nuclear stress test 2005   stress cardiolite    OSTEOARTHRITIS 09/07/2006   Positive PPD    [place]- shows positive after receiving   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  Dr Lucia Gaskins   VITAMIN D DEFICIENCY 08/14/2009   Past Surgical History:  Procedure Laterality Date   COLONOSCOPY     NASAL POLYP SURGERY  04/16/2020   POLYPECTOMY     SALIVARY GLAND SURGERY  1996   Tumor removed on back of tongue and salivary gland   Patient Active Problem List   Diagnosis Date Noted   Hip crepitus 02/27/2014   Visit for preventive health examination 10/28/2013   Nocturia 10/28/2013   Osteopenia 11/15/2012   AC (acromioclavicular) arthritis 11/10/2011   Cubital tunnel syndrome on right 10/14/2011   Arm pain, right 10/14/2011   Arthritis 10/05/2010   Physical exam, annual 06/02/2010   Diabetes mellitus  type 2, uncomplicated ([place]) 98/61/4830   Positive PPD 06/02/2010   VITAMIN D DEFICIENCY 08/14/2009   ABNORMAL EXAM-BILIARY TRACT 11/14/2008   PAIN IN JOINT, HAND 07/22/2008   Hypothyroidism 10/30/2006   Hyperlipidemia 09/07/2006   GERD 09/07/2006   OSTEOARTHRITIS 09/07/2006   FASTING HYPERGLYCEMIA 09/07/2006      PCP: Burnis Medin, MD   REFERRING PROVIDER: Burnis Medin, MD   REFERRING DIAG: [phone] (ICD-10-CM) - Chronic right shoulder pain   THERAPY DIAG:  Decreased right shoulder range of motion   Chronic right shoulder pain   Muscle weakness (generalized)     ONSET DATE: 06/2020   SUBJECTIVE:  SUBJECTIVE STATEMENT: Pt states the R shoulder was sore 2 days after last session but she feels much better today. She states it is no longer sore.    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 No VAS scale: 0/10 posteriorly Pain location: R shoulder   PAIN TYPE: aching Pain description: intermittent  Aggravating factors: reaching behind back, dressing, keyboarding Relieving factors: patches, topical ointments, tylenol    PRECAUTIONS: None   PATIENT GOALS : Pt states she would like to reach behind back normally without pain.    OBJECTIVE:     DIAGNOSTIC FINDINGS:  FINDINGS: There is no evidence of fracture or dislocation. There is no evidence of arthropathy or other focal bone abnormality. Soft tissues are unremarkable.   IMPRESSION: Negative.   Diagnostic Limited MSK Ultrasound of: Right shoulder Biceps tendon is intact in bicipital groove.  Small amount of hypoechoic fluid tracks within biceps tendon sheath. Subscapularis tendon is intact and normal appearing Supraspinatus tendon is intact. Mild  subacromial bursitis is present. Infraspinatus tendon is intact. AC joint degenerative appearing Impression: Subacromial bursitis   PATIENT SURVEYS:  FOTO 55 63 D/C 5 pts MCII   6th visit FOTO 1/3: 73 pts              TODAY'S TREATMENT:    Joint mobs: Inf and post R GHJ grade IV in open pack;  STM R infra   Exercises UBE L1.5 3 min fwd and retro Posterior shoulder stretch 30s 3x Upper back stretch 30s 2x Stars 10x RTB Doorway pec stretch 30s 3x Wall push up 15x   PATIENT EDUCATION: Education details: anatomy, exercise progression, acceptable pain levels, HEP updates, ROM increases Person educated: Patient Education method: Explanation, Demonstration, Tactile cues, Verbal cues, and Handouts Education comprehension: verbalized understanding, returned demonstration, verbal cues required, and tactile cues required     HOME EXERCISE PROGRAM: Access Code: [email or web address] URL: [email or web address] Date: 05/11/2021 Prepared by: Daleen Bo  Exercises Seated Shoulder Flexion Towel Slide at Table Top - 2 x daily - 7 x weekly - 1 sets - 10 reps - 5 hold Sleeper Stretch - 2 x daily - 7 x weekly - 1 sets - 10 reps - 10 hold Standing Shoulder Row with Anchored Resistance - 1 x daily - 7 x weekly - 3 sets - 10 reps Shoulder Abduction with Dumbbells - Thumbs Up - 1 x daily - 7 x weekly - 2 sets - 10 reps Standing Shoulder Internal Rotation Stretch with Dowel - 1 x daily - 7 x weekly - 2 sets - 10 reps - 5 hold    ASSESSMENT:   CLINICAL IMPRESSION: Pt with significantly improved IR ROM today- easily able to reach L4 without assist and able lift off without difficulty. Pt's posterior shoulder soreness is mainly in the R infra that is likely due to increase in R shoulder strength loading. Pt is slowly improving ROM at this time and able to reduce frequency of visits. Pt advised to speak with MD regarding potential injection should she feel a plateau with therapy or drastic  increase in pain. Plan to continue with ROM and strength as tolerated.    Objective impairments include decreased ROM, decreased strength, hypomobility, increased muscle spasms, impaired flexibility, impaired UE functional use, improper body mechanics, postural dysfunction, and pain. These impairments are limiting patient from cleaning, community activity, driving, meal prep, occupation, laundry, yard work, shopping, and caregiver duties . Personal factors including  Age, Fitness, Sex, Time since onset of injury/illness/exacerbation, and 1-2 comorbidities:    are also affecting patient's functional outcome. Patient will benefit from skilled PT to address above impairments and improve overall function.   REHAB POTENTIAL: Good   CLINICAL DECISION MAKING: Stable/uncomplicated   EVALUATION COMPLEXITY: Low     GOALS:     SHORT TERM GOALS:   STG Name Target Date Goal status  1 Pt will become independent with HEP in order to demonstrate synthesis of PT education.   Baseline:  04/08/2021 MET  2 Pt will report at least 2 pt reduction on NPRS scale for pain in order to demonstrate functional improvement with household activity, self care, and ADL.   Baseline:  05/06/2021 MET  3 Pt will be able to demonstrate full R OH ROM without pain in order to demonstrate functional improvement in UE function for self-care and house hold duties.  Baseline: 05/06/2021 ongoing    LONG TERM GOALS:    LTG Name Target Date Goal status  1 Pt  will become independent with final HEP in order to demonstrate synthesis of PT education.   Baseline: 06/17/2021 ongoing  2 Pt will be able to reach [facility] and carry/hold >5 lbs in order to demonstrate functional improvement in L UE strength for return to PLOF and exercise.   Baseline: 06/17/2021 ongoing  3 Pt will be able to demonstrate BHB IR reach without pain in order to demonstrate functional improvement in UE function for self-care and house hold duties.   06/17/2021 Partially  met  4 Pt will score >/= 63 on FOTO to demonstrate functional improvement in shoulder pain.   Baseline: 06/17/2021 MET    PLAN: PT FREQUENCY: 1-2x/week   PT DURATION: 12 weeks (likely DC by 8 weeks)   PLANNED INTERVENTIONS: Therapeutic exercises, Therapeutic activity, Neuro Muscular re-education, Balance training, Gait training, Patient/Family education, Joint mobilization, Aquatic Therapy, Dry Needling, Electrical stimulation, Spinal mobilization, Cryotherapy, Moist heat, Compression bandaging, scar mobilization, Taping, Vasopneumatic device, Traction, Ultrasound, Ionotophoresis 29m/ml Dexamethasone, and Manual therapy   PLAN FOR NEXT SESSION:  joint mobilizations; review scap strength and posterior shoulder strength, add more aggressive IR and horizontal ABD  ADaleen BoPT, DPT 05/25/21 12:04 PM

## 2021-06-04 ENCOUNTER — Telehealth: Payer: Self-pay | Admitting: Pharmacist

## 2021-06-04 NOTE — Chronic Care Management (AMB) (Signed)
° ° °  Chronic Care Management Pharmacy Assistant   Name: Debbie Marshall  MRN: [id number] DOB: 11/30/1945  Reason for Encounter:  Reschedule canceled appointment on 03/15/2021 with Jeni Salles.  Unable to reach patient after several attempts.   [place] Pharmacist Assistant [phone]

## 2021-06-10 ENCOUNTER — Ambulatory Visit (HOSPITAL_BASED_OUTPATIENT_CLINIC_OR_DEPARTMENT_OTHER): Payer: Medicare Other | Attending: Family Medicine | Admitting: Physical Therapy

## 2021-06-10 ENCOUNTER — Other Ambulatory Visit: Payer: Self-pay

## 2021-06-10 ENCOUNTER — Encounter (HOSPITAL_BASED_OUTPATIENT_CLINIC_OR_DEPARTMENT_OTHER): Payer: Self-pay | Admitting: Physical Therapy

## 2021-06-10 DIAGNOSIS — M25611 Stiffness of right shoulder, not elsewhere classified: Secondary | ICD-10-CM | POA: Diagnosis not present

## 2021-06-10 DIAGNOSIS — G8929 Other chronic pain: Secondary | ICD-10-CM

## 2021-06-10 DIAGNOSIS — M25511 Pain in right shoulder: Secondary | ICD-10-CM | POA: Diagnosis not present

## 2021-06-10 DIAGNOSIS — M6281 Muscle weakness (generalized): Secondary | ICD-10-CM

## 2021-06-10 NOTE — Therapy (Signed)
OUTPATIENT PHYSICAL THERAPY TREATMENT NOTE       Patient Name: Debbie Marshall MRN: [id number] DOB:1945-05-04, 76 y.o., female Today's Date: 06/10/2021  PCP: Burnis Medin, MD REFERRING PROVIDER: Burnis Medin, MD   PT End of Session - 06/10/21 1111     Visit Number 13    Number of Visits 22    Date for PT Re-Evaluation 06/15/21    Authorization Type [employer] Medicare    PT Start Time 1104    PT Stop Time 1142    PT Time Calculation (min) 38 min    Activity Tolerance Patient tolerated treatment well;No increased pain    Behavior During Therapy WFL for tasks assessed/performed                    Past Medical History:  Diagnosis Date   ABNORMAL EXAM-BILIARY TRACT 11/14/2008   ABNORMAL TRANSAMINASE, (LFT'S) 11/14/2008    2005 US shows fatty liver   Allergy    Cancer ([place]) 1996   tongue cancer   Diabetes mellitus without complication ([place])    FASTING HYPERGLYCEMIA 09/07/2006   GERD 09/07/2006   HYPERLIPIDEMIA 09/07/2006   HYPOTHYROIDISM 10/30/2006   Nonspecific abnormal results of liver function study 09/07/2006   Normal nuclear stress test 2005   stress cardiolite    OSTEOARTHRITIS 09/07/2006   Positive PPD    [place] vaccine [place]- shows positive after receiving   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  Dr Lucia Gaskins   VITAMIN D DEFICIENCY 08/14/2009   Past Surgical History:  Procedure Laterality Date   COLONOSCOPY     NASAL POLYP SURGERY  04/16/2020   POLYPECTOMY     SALIVARY GLAND SURGERY  1996   Tumor removed on back of tongue and salivary gland   Patient Active Problem List   Diagnosis Date Noted   Hip crepitus 02/27/2014   Visit for preventive health examination 10/28/2013   Nocturia 10/28/2013   Osteopenia 11/15/2012   AC (acromioclavicular) arthritis 11/10/2011   Cubital tunnel syndrome on right 10/14/2011   Arm pain, right 10/14/2011   Arthritis 10/05/2010   Physical exam, annual 06/02/2010   Diabetes mellitus type 2,  uncomplicated ([place]) 33/61/2244   Positive PPD 06/02/2010   VITAMIN D DEFICIENCY 08/14/2009   ABNORMAL EXAM-BILIARY TRACT 11/14/2008   PAIN IN JOINT, HAND 07/22/2008   Hypothyroidism 10/30/2006   Hyperlipidemia 09/07/2006   GERD 09/07/2006   OSTEOARTHRITIS 09/07/2006   FASTING HYPERGLYCEMIA 09/07/2006      PCP: Burnis Medin, MD   REFERRING PROVIDER: Burnis Medin, MD   REFERRING DIAG: [phone] (ICD-10-CM) - Chronic right shoulder pain   THERAPY DIAG:  Decreased right shoulder range of motion   Chronic right shoulder pain   Muscle weakness (generalized)     ONSET DATE: 06/2020   SUBJECTIVE:  SUBJECTIVE STATEMENT: Pt states the R shoulder has been painful for the last few days. She has been doing the UBE at the [facility] which may have caused pain.     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 No VAS scale: 4/10 posteriorly Pain location: R shoulder   PAIN TYPE: aching Pain description: intermittent  Aggravating factors: reaching behind back, dressing, keyboarding Relieving factors: patches, topical ointments, tylenol    PRECAUTIONS: None   PATIENT GOALS : Pt states she would like to reach behind back normally without pain.    OBJECTIVE:     DIAGNOSTIC FINDINGS:  FINDINGS: There is no evidence of fracture or dislocation. There is no evidence of arthropathy or other focal bone abnormality. Soft tissues are unremarkable.   IMPRESSION: Negative.   Diagnostic Limited MSK Ultrasound of: Right shoulder Biceps tendon is intact in bicipital groove.  Small amount of hypoechoic fluid tracks within biceps tendon sheath. Subscapularis tendon is intact and normal appearing Supraspinatus tendon is intact. Mild  subacromial bursitis is present. Infraspinatus tendon is intact. AC joint degenerative appearing Impression: Subacromial bursitis   PATIENT SURVEYS:  FOTO 55 63 D/C 5 pts MCII   6th visit FOTO 1/3: 73 pts              TODAY'S TREATMENT:    Joint mobs: Inf and post R GHJ grade IV in open pack;  STM R infra   Exercises UBE L1.5 3 min fwd and retro Seated chest supported row 3x1010lbs Chest press machine 10lbs 3x10 Machine lat pull down 3x10 10lbs     PATIENT EDUCATION: Education details: anatomy, exercise progression, acceptable pain levels, HEP updates, ROM increases Person educated: Patient Education method: Explanation, Demonstration, Tactile cues, Verbal cues, and Handouts Education comprehension: verbalized understanding, returned demonstration, verbal cues required, and tactile cues required     HOME EXERCISE PROGRAM: Access Code: [email or web address] URL: [email or web address] Date: 05/11/2021 Prepared by: Daleen Bo  Exercises Seated Shoulder Flexion Towel Slide at Table Top - 2 x daily - 7 x weekly - 1 sets - 10 reps - 5 hold Sleeper Stretch - 2 x daily - 7 x weekly - 1 sets - 10 reps - 10 hold Standing Shoulder Row with Anchored Resistance - 1 x daily - 7 x weekly - 3 sets - 10 reps Shoulder Abduction with Dumbbells - Thumbs Up - 1 x daily - 7 x weekly - 2 sets - 10 reps Standing Shoulder Internal Rotation Stretch with Dowel - 1 x daily - 7 x weekly - 2 sets - 10 reps - 5 hold    ASSESSMENT:   CLINICAL IMPRESSION: Pt presents with increased muscle soreness at today's session that is consistent with increase in R shoulder usage with exercise and ADL. Pt is still limited with IR but does have improvement with ease of movement to L4. Pt is able to load OH motion and increase resistance of strengthening program without pain. Pt does have improved OH ROM following eccentric shoulder ABD. Pt advised on safe equipment to use while at the [facility] and self load  management. Plan to cont with reduced frequency. Pt given verbal edu about HEP modifications at the gym. Printout given for home routine. PN/Re-cert at next session. Pt is steadily improving with more independent taper.   Objective impairments include decreased ROM, decreased strength, hypomobility, increased muscle spasms, impaired flexibility, impaired UE functional use, improper body mechanics, postural dysfunction, and pain. These  impairments are limiting patient from cleaning, community activity, driving, meal prep, occupation, laundry, yard work, shopping, and caregiver duties . Personal factors including Age, Fitness, Sex, Time since onset of injury/illness/exacerbation, and 1-2 comorbidities:    are also affecting patient's functional outcome. Patient will benefit from skilled PT to address above impairments and improve overall function.   REHAB POTENTIAL: Good   CLINICAL DECISION MAKING: Stable/uncomplicated   EVALUATION COMPLEXITY: Low     GOALS:     SHORT TERM GOALS:   STG Name Target Date Goal status  1 Pt will become independent with HEP in order to demonstrate synthesis of PT education.   Baseline:  04/08/2021 MET  2 Pt will report at least 2 pt reduction on NPRS scale for pain in order to demonstrate functional improvement with household activity, self care, and ADL.   Baseline:  05/06/2021 MET  3 Pt will be able to demonstrate full R OH ROM without pain in order to demonstrate functional improvement in UE function for self-care and house hold duties.  Baseline: 05/06/2021 ongoing    LONG TERM GOALS:    LTG Name Target Date Goal status  1 Pt  will become independent with final HEP in order to demonstrate synthesis of PT education.   Baseline: 06/17/2021 ongoing  2 Pt will be able to reach [facility] and carry/hold >5 lbs in order to demonstrate functional improvement in L UE strength for return to PLOF and exercise.   Baseline: 06/17/2021 ongoing  3 Pt will be able to demonstrate  BHB IR reach without pain in order to demonstrate functional improvement in UE function for self-care and house hold duties.   06/17/2021 Partially met  4 Pt will score >/= 63 on FOTO to demonstrate functional improvement in shoulder pain.   Baseline: 06/17/2021 MET    PLAN: PT FREQUENCY: 1-2x/week   PT DURATION: 12 weeks (likely DC by 8 weeks)   PLANNED INTERVENTIONS: Therapeutic exercises, Therapeutic activity, Neuro Muscular re-education, Balance training, Gait training, Patient/Family education, Joint mobilization, Aquatic Therapy, Dry Needling, Electrical stimulation, Spinal mobilization, Cryotherapy, Moist heat, Compression bandaging, scar mobilization, Taping, Vasopneumatic device, Traction, Ultrasound, Ionotophoresis 55m/ml Dexamethasone, and Manual therapy   PLAN FOR NEXT SESSION:  joint mobilizations; review scap strength and posterior shoulder strength, add more aggressive IR and horizontal ABD  ADaleen BoPT, DPT 06/10/21 11:45 AM

## 2021-06-14 ENCOUNTER — Other Ambulatory Visit: Payer: Medicare Other

## 2021-06-14 ENCOUNTER — Telehealth: Payer: Self-pay | Admitting: Internal Medicine

## 2021-06-14 NOTE — Telephone Encounter (Signed)
Patient was called and informed to arrive 45 mins early for testing  ?

## 2021-06-14 NOTE — Telephone Encounter (Signed)
Agree with  in person appt.  PLease do covid flu screen  on patient when she arrives .  thanks

## 2021-06-14 NOTE — Telephone Encounter (Signed)
Last Ov 03/08/21 ?Please advise ?

## 2021-06-14 NOTE — Telephone Encounter (Signed)
Pt said she speak triage nurse and per pt was told to see md asap. Pt has been having sore throat, chest pain when she coughs x 1wk. Pt said the triage nurse told her to be seen. Please advise ?

## 2021-06-14 NOTE — Progress Notes (Signed)
? ?Chief Complaint  ?Patient presents with  ? Cough  ?  X 10 days was  taking amoxicillin, sore throat   ? ? ?HPI: ?Debbie Marshall 76 y.o. come in for new sx  see messages yesterday   onset 10 days ago of sore throat .   Took left over amox  for 5 days .  Sore throat improved but then over the last 2 to 3 days has and then  developed chest congestion and cough .   And lost voice recnetly .  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 ? ?Cough feels wheezy at times and now has chest pain middle lower chest when coughs.  Not lots of production but did have one streak of blood that is probably from her upper airway. ?Fortunately her sinuses are not hurting at this time (she has had nasal polyps and sinus surgery is improved her status )   he also tends to have allergy in fact infection rate this time of year.  Is not taking Flonase at this point because she has been better since the surgery..  ? ?Hot tea a juice.  Mucinex   not helping.  ?Lives with elderly father who spite her best efforts prefers to be around her. ?ROS: See pertinent positives and negatives per HPI. ? ?Past Medical History:  ?Diagnosis Date  ? ABNORMAL EXAM-BILIARY TRACT 11/14/2008  ? ABNORMAL TRANSAMINASE, (LFT'S) 11/14/2008  ?  2005 US shows fatty liver  ? Allergy   ? Cancer [facility]) 1996  ? tongue cancer  ? Diabetes mellitus without complication ([place])   ? FASTING HYPERGLYCEMIA 09/07/2006  ? GERD 09/07/2006  ? HYPERLIPIDEMIA 09/07/2006  ? HYPOTHYROIDISM 10/30/2006  ? Nonspecific abnormal results of liver function study 09/07/2006  ? Normal nuclear stress test 2005  ? stress cardiolite   ? OSTEOARTHRITIS 09/07/2006  ? Positive PPD   ? [place] vaccine [place]- shows positive after receiving  ? Salivary gland tumor 1996  ?   near tongue base removed  Dr Lucia Gaskins  ? VITAMIN D DEFICIENCY 08/14/2009  ? ? ?Family History  ?Problem Relation Age of Onset  ? Stroke Mother 54  ? Arthritis Mother   ? Rheum arthritis Brother   ? Arthritis Brother   ? Gout Brother    ? Colon cancer Other   ? Esophageal cancer Neg Hx   ? Stomach cancer Neg Hx   ? Rectal cancer Neg Hx   ? Colon polyps Neg Hx   ? ? ?Social History  ? ?Socioeconomic History  ? Marital status: Single  ?  Spouse name: Not on file  ? Number of children: Not on file  ? Years of education: Not on file  ? Highest education level: Not on file  ?Occupational History  ? Occupation: PhD Professor  ?Tobacco Use  ? Smoking status: Never  ? Smokeless tobacco: Never  ?Vaping Use  ? Vaping Use: Never used  ?Substance and Sexual Activity  ? Alcohol use: Yes  ?  Alcohol/week: 1.0 standard drink  ?  Types: 1 Glasses of wine per week  ?  Comment: rarely  ? Drug use: No  ? Sexual activity: Not on file  ?Other Topics Concern  ? Not on file  ?Social History Narrative  ? HH of 2 Father and her  ?  cat   ?  PhD professor in information systems  ?  No tobacco or alcohol exercising regularly currently  ? ?Social Determinants of  Health  ? ?Financial Resource Strain: Low Risk   ? Difficulty of Paying Living Expenses: Not hard at all  ?Food Insecurity: No Food Insecurity  ? Worried About Charity fundraiser in the Last Year: Never true  ? Ran Out of Food in the Last Year: Never true  ?Transportation Needs: No Transportation Needs  ? Lack of Transportation (Medical): No  ? Lack of Transportation (Non-Medical): No  ?Physical Activity: Insufficiently Active  ? Days of Exercise per Week: 3 days  ? Minutes of Exercise per Session: 30 min  ?Stress: No Stress Concern Present  ? Feeling of Stress : Not at all  ?Social Connections: Moderately Integrated  ? Frequency of Communication with Friends and Family: More than three times a week  ? Frequency of Social Gatherings with Friends and Family: More than three times a week  ? Attends Religious Services: More than 4 times per year  ? Active Member of Clubs or Organizations: Yes  ? Attends Archivist Meetings: More than 4 times per year  ? Marital Status: Never married  ? ? ?Outpatient  Medications Prior to Visit  ?Medication Sig Dispense Refill  ? ACCU-CHEK GUIDE test strip TEST TWICE DAILY 200 strip 3  ? Accu-Chek Softclix Lancets lancets TEST TWICE DAILY AS  DIRECTED 200 each 3  ? Ascorbic Acid (VITAMIN C) 500 MG CAPS Take 1 capsule by mouth daily.    ? Blood Glucose Monitoring Suppl (ACCU-CHEK GUIDE) w/Device KIT USE TO CHECK BLOOD SUGAR  TWICE DAILY 1 kit 1  ? Cholecalciferol 1000 UNITS capsule Take 1 capsule (1,000 Units total) by mouth daily. 90 capsule 3  ? cyanocobalamin 2000 MCG tablet Take 2,500 mcg by mouth daily.    ? levothyroxine (SYNTHROID) 50 MCG tablet TAKE 1 TABLET BY MOUTH  DAILY 90 tablet 3  ? LUTEIN PO Take 40 mg by mouth daily.    ? metFORMIN (GLUCOPHAGE-XR) 500 MG 24 hr tablet TAKE 2 TABLETS BY MOUTH  DAILY 180 tablet 3  ? MULTIPLE VITAMIN PO Take 1 capsule by mouth daily.    ? Omega-3 Fatty Acids (FISH OIL) 435 MG CAPS Take 1 capsule by mouth in the morning and at bedtime.    ? simvastatin (ZOCOR) 40 MG tablet TAKE 1 TABLET BY MOUTH AT  BEDTIME 90 tablet 3  ? fluticasone (FLONASE) 50 MCG/ACT nasal spray Place 2 sprays into both nostrils daily. (Patient not taking: Reported on 03/17/2021) 16 g 2  ? ?No facility-administered medications prior to visit.  ? ? ? ?EXAM: ? ?BP 140/82 (BP Location: Left Arm, Patient Position: Sitting, Cuff Size: Normal)   Pulse (!) 59   Temp 98.4 ?F (36.9 ?C) (Oral)   Ht 4' 11"  (1.499 m)   Wt 119 lb 3.2 oz (54.1 kg)   SpO2 98%   BMI 24.08 kg/m?  ? ?Body mass index is 24.08 kg/m?. ? ?GENERAL: vitals reviewed and listed above, alert, oriented, appears well hydrated and in no acute distress she is modestly hoarse but no stridor or respiratory distress.  Occasional cough no dyspnea ?HEENT: atraumatic, conjunctiva  clear, no obvious abnormalities on inspection of external nose and ears tms clear OP : masked  ?NECK: no obvious masses on inspection palpation  ?LUNGS: clear to auscultation bilaterally, no wheezes, rales or rhonchi, good air  movement ?CV: HRRR, no clubbing cyanosis or  peripheral edema nl cap refill  ?MS: moves all extremities without noticeable focal  abnormality ?PSYCH: pleasant and cooperative, no obvious depression or anxiety ? ?  BP Readings from Last 3 Encounters:  ?06/15/21 140/82  ?03/08/21 126/70  ?02/19/21 122/78  ? ?X-ray review no acute findings. ?Flu and rapid COVID test negative ?ASSESSMENT AND PLAN: ? ?Discussed the following assessment and plan: ? ?Acute respiratory infection - With cough and laryngitis. ? ?Cough, unspecified type - Plan: POC COVID-19, POC Influenza A/B, DG Chest 2 View ? ?Other chest pain ?Appears to be a viral illness although could have underlying allergy at this point no alarm symptoms although she has had any symptoms for about 10 days her cough is only been there for less than 5 days.  Expectant management follow-up with alarm signs or failure to improve after the weekend ?Options discussed we will try Tessalon Perles in addition to her hot liquids and voice rest. ? ?-Patient advised to return or notify health care team  if  new concerns arise. ? ?Patient Instructions  ?Exam is reasusring and chest x ray clear.  ? ?This acts like viral respiratory infection  flu like ( although  suspect you could aggravated with underlying allergy)  ? ?Hot liquids  voice rest  .  ? ?Expect  next week  cough to improve  but last another 1-2 weeks .  ? ?Standley Brooking. Finneus Kaneshiro M.D. ?

## 2021-06-14 NOTE — Telephone Encounter (Signed)
---  Caller originally wanted to cancel her blood work ?appt today at [place] then proceeded to say she has ?a severe cold and chest congestion and wanted to speak ?to nurse. Caller added that whenever she coughs she ?does have chest pain. ? ? ?06/14/2021 7:44:13 AM See PCP within [place], RN, Zella Ball ? ?Comments ?User: Wilson Singer, RN Date/Time Eilene Ghazi Time): 06/14/2021 7:38:07 AM ?Sore throat started last Sunday and went away and then back yesterday. When she coughs has [ain in the chest and ?tried Mucinex and not helping.. ? ?06/14/21 1221: Pt states sore throat & cough began x 10 days ago. She took leftover Amoxicillin x 5 days, which improved symptoms however they have since returned. Pt has cough that is intermittently productive with green/yellow mucous noted. She states her chest now hurts with coughing. Denies fever or SOB. Pt prefers appt for today, no available appts with any provider. Pt scheduled appt with PCP for 06/15/21 at 10am ?

## 2021-06-15 ENCOUNTER — Ambulatory Visit (INDEPENDENT_AMBULATORY_CARE_PROVIDER_SITE_OTHER): Payer: Medicare Other

## 2021-06-15 ENCOUNTER — Other Ambulatory Visit: Payer: Self-pay

## 2021-06-15 ENCOUNTER — Ambulatory Visit (INDEPENDENT_AMBULATORY_CARE_PROVIDER_SITE_OTHER): Payer: Medicare Other | Admitting: Internal Medicine

## 2021-06-15 ENCOUNTER — Encounter: Payer: Self-pay | Admitting: Internal Medicine

## 2021-06-15 VITALS — BP 140/82 | HR 59 | Temp 98.4°F | Ht 59.0 in | Wt 119.2 lb

## 2021-06-15 DIAGNOSIS — R0789 Other chest pain: Secondary | ICD-10-CM | POA: Diagnosis not present

## 2021-06-15 DIAGNOSIS — R059 Cough, unspecified: Secondary | ICD-10-CM | POA: Diagnosis not present

## 2021-06-15 DIAGNOSIS — R079 Chest pain, unspecified: Secondary | ICD-10-CM | POA: Diagnosis not present

## 2021-06-15 DIAGNOSIS — J22 Unspecified acute lower respiratory infection: Secondary | ICD-10-CM

## 2021-06-15 DIAGNOSIS — I700 Atherosclerosis of aorta: Secondary | ICD-10-CM | POA: Diagnosis not present

## 2021-06-15 LAB — POC COVID19 BINAXNOW: SARS Coronavirus 2 Ag: NEGATIVE

## 2021-06-15 LAB — POCT INFLUENZA A/B: Influenza A, POC: NEGATIVE

## 2021-06-15 MED ORDER — BENZONATATE 100 MG PO CAPS: 100.0000 mg | ORAL_CAPSULE | Freq: Three times a day (TID) | ORAL | 1 refills | Status: DC | PRN

## 2021-06-15 NOTE — Patient Instructions (Signed)
Exam is reasusring and chest x ray clear.  ? ?This acts like viral respiratory infection  flu like ( although  suspect you could aggravated with underlying allergy)  ? ?Hot liquids  voice rest  .  ? ?Expect  next week  cough to improve  but last another 1-2 weeks .  ? ?

## 2021-06-24 ENCOUNTER — Ambulatory Visit (HOSPITAL_BASED_OUTPATIENT_CLINIC_OR_DEPARTMENT_OTHER): Payer: Medicare Other | Admitting: Physical Therapy

## 2021-06-24 ENCOUNTER — Other Ambulatory Visit: Payer: Self-pay

## 2021-06-24 ENCOUNTER — Encounter (HOSPITAL_BASED_OUTPATIENT_CLINIC_OR_DEPARTMENT_OTHER): Payer: Self-pay | Admitting: Physical Therapy

## 2021-06-24 DIAGNOSIS — M25611 Stiffness of right shoulder, not elsewhere classified: Secondary | ICD-10-CM | POA: Diagnosis not present

## 2021-06-24 DIAGNOSIS — M6281 Muscle weakness (generalized): Secondary | ICD-10-CM | POA: Diagnosis not present

## 2021-06-24 DIAGNOSIS — G8929 Other chronic pain: Secondary | ICD-10-CM | POA: Diagnosis not present

## 2021-06-24 DIAGNOSIS — M25511 Pain in right shoulder: Secondary | ICD-10-CM | POA: Diagnosis not present

## 2021-06-24 NOTE — Therapy (Signed)
?OUTPATIENT PHYSICAL THERAPY RE-CERTIFICATION NOTE ? ? ?  ? ? ?Patient Name: Debbie Marshall ?MRN: [id number] ?DOB:11/03/1945, 76 y.o., female ?Today's Date: 06/24/2021 ? ?PCP: Burnis Medin, MD ?REFERRING PROVIDER: Burnis Medin, MD ? ? PT End of Session - 06/24/21 1153   ? ? Visit Number 14   ? Number of Visits 22   ? Date for PT Re-Evaluation 09/13/21   ? Authorization Type UHC Medicare   ? PT Start Time 1150   ? PT Stop Time 1230   ? PT Time Calculation (min) 40 min   ? Activity Tolerance Patient tolerated treatment well;No increased pain   ? Behavior During Therapy [facility] for tasks assessed/performed   ? ?  ?  ? ?  ? ? ? ? ? ? ? ? ? ? ?Past Medical History:  ?Diagnosis Date  ? ABNORMAL EXAM-BILIARY TRACT 11/14/2008  ? ABNORMAL TRANSAMINASE, (LFT'S) 11/14/2008  ?  2005 US shows fatty liver  ? Allergy   ? Cancer [facility]) 1996  ? tongue cancer  ? Diabetes mellitus without complication ([place])   ? FASTING HYPERGLYCEMIA 09/07/2006  ? GERD 09/07/2006  ? HYPERLIPIDEMIA 09/07/2006  ? HYPOTHYROIDISM 10/30/2006  ? Nonspecific abnormal results of liver function study 09/07/2006  ? Normal nuclear stress test 2005  ? stress cardiolite   ? OSTEOARTHRITIS 09/07/2006  ? Positive PPD   ? [place]- shows positive after receiving  ? Salivary gland tumor 1996  ?   near tongue base removed  Dr Lucia Gaskins  ? VITAMIN D DEFICIENCY 08/14/2009  ? ?Past Surgical History:  ?Procedure Laterality Date  ? COLONOSCOPY    ? NASAL POLYP SURGERY  04/16/2020  ? POLYPECTOMY    ? SALIVARY GLAND SURGERY  1996  ? Tumor removed on back of tongue and salivary gland  ? ?Patient Active Problem List  ? Diagnosis Date Noted  ? Hip crepitus 02/27/2014  ? Visit for preventive health examination 10/28/2013  ? Nocturia 10/28/2013  ? Osteopenia 11/15/2012  ? AC (acromioclavicular) arthritis 11/10/2011  ? Cubital tunnel syndrome on right 10/14/2011  ? Arm pain, right 10/14/2011  ? Arthritis 10/05/2010  ? Physical exam, annual 06/02/2010  ? Diabetes mellitus type 2,  uncomplicated ([place]) 29/52/8413  ? Positive PPD 06/02/2010  ? VITAMIN D DEFICIENCY 08/14/2009  ? ABNORMAL EXAM-BILIARY TRACT 11/14/2008  ? PAIN IN JOINT, HAND 07/22/2008  ? Hypothyroidism 10/30/2006  ? Hyperlipidemia 09/07/2006  ? GERD 09/07/2006  ? OSTEOARTHRITIS 09/07/2006  ? FASTING HYPERGLYCEMIA 09/07/2006  ? ? ? ? ?PCP: Panosh, Standley Brooking, MD ?  ?REFERRING PROVIDER: Burnis Medin, MD ?  ?REFERRING DIAG: M25.511,G89.29 (ICD-10-CM) - Chronic right shoulder pain ?  ?THERAPY DIAG:  ?Decreased right shoulder range of motion ?  ?Chronic right shoulder pain ?  ?Muscle weakness (generalized) ?  ?  ?ONSET DATE: 06/2020 ?  ?SUBJECTIVE:                                                                                                                                                                                     ?  ?  SUBJECTIVE STATEMENT: ?Pt states she has been doing more with her shoulder. She is able to go to the [facility] and do light shoulder exercise but the R shoulder still still have pain. She had pain for a day or two and today it is better. Pt states that reaching BHB has not gotten better.  ? ?  ?PERTINENT HISTORY: ?DM2, polyarthralgi, OA ?  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  ?PAIN:  ?Are you having pain? No ?VAS scale: 0/10 posteriorly ?Pain location: R shoulder ?  ?PAIN TYPE: aching ?Pain description: intermittent  ?Aggravating factors: reaching behind back, dressing, keyboarding ?Relieving factors: patches, topical ointments, tylenol  ?  ?PRECAUTIONS: None ?  ?PATIENT GOALS : Pt states she would like to reach behind back normally without pain.  ?  ?OBJECTIVE:  ? ?  ?DIAGNOSTIC FINDINGS:  ?FINDINGS: ?There is no evidence of fracture or dislocation. There is no ?evidence of arthropathy or other focal bone abnormality. Soft ?tissues are unremarkable. ?  ?IMPRESSION: ?Negative. ?  ?Diagnostic Limited MSK Ultrasound of: Right shoulder ?Biceps tendon is intact in bicipital groove.  Small amount of hypoechoic fluid  tracks within biceps tendon sheath. ?Subscapularis tendon is intact and normal appearing ?Supraspinatus tendon is intact. ?Mild subacromial bursitis is present. ?Infraspinatus tendon is intact. ?AC joint degenerative appearing ?Impression: Subacromial bursitis ?  ? ?  ?PATIENT SURVEYS:  ?FOTO 55 ?64 D/C ?5 pts MCII ?  ?6th visit FOTO 1/3: 74 pts ? 14th visit FOTO 3/16 72 ?  ?A/PROM Right ?03/17/2021 Left ?03/17/2021 1/31 ?R R ?3/16  ?Shoulder flexion 120 p! 150 141  143  ?Shoulder extension 30 45 45 50  ?Shoulder abduction 110 p! 150 143 150  ?Shoulder adduction         ?Shoulder internal rotation BHB reach to glute p! WFL reach L2 BHB L1 BHB  ?Shoulder external rotation 40 55 50 50  ?(Blank rows = not tested) ?  ?UPPER EXTREMITY MMT: ?  ?MMT Right ?03/17/2021 Left ?03/17/2021 R ? ?3/16  ?Shoulder flexion 4/5 4+/5 4+/5  ?Shoulder extension       ?Shoulder abduction 4/5 4+/5 4+/5  ?Shoulder adduction 4/5 4+/5 4+/5  ?IR 4/5 4+/5 4+/5  ?(Blank rows = not tested) ? ? ? ?           ?TODAY'S TREATMENT:  ?  ?Joint mobs: Inf and post R GHJ grade IV in open pack;  ?Prone PA GHJ grade IIII ?STM infra ?  ?Full review of HEP and self maintenance/pain relief/acceptable levels of pain with HEP and frequency of exercise ? ?HEP: ?Seated Shoulder Flexion Towel Slide at Table Top - 2 x daily - 7 x weekly - 1 sets - 10 reps - 5 hold ?Sleeper Stretch - 2 x daily - 7 x weekly - 1 sets - 10 reps - 10 hold ?Standing Shoulder Row with Anchored Resistance - 1 x daily - 7 x weekly - 3 sets - 10 reps ?Shoulder Abduction with Dumbbells - Thumbs Up - 1 x daily - 7 x weekly - 2 sets - 10 reps ?Standing Shoulder Internal Rotation Stretch with Dowel - 1 x daily - 7 x weekly - 2 sets - 10 reps - 5 hold ?  ?PATIENT EDUCATION: ?Education details: exam findings, importance of stretching/HEP consistency, exercise progression, acceptable pain levels, HEP updates, ROM increases ?Person educated: Patient ?Education method: Explanation, Demonstration, Tactile  cues, Verbal cues ?Education comprehension: verbalized understanding, returned demonstration, verbal cues required, and tactile cues required ?  ?  ?  HOME EXERCISE PROGRAM: ?Access Code: [email or web address] ?URL: [email or web address] ?Date: 05/11/2021 ?Prepared by: Daleen Bo ? ? ? ?  ?ASSESSMENT: ?  ?CLINICAL IMPRESSION: ?Pt continues to show slow progress with ROM as compared to previous sessions but is still limited with BHB reaching. Pt did not have clinically significant change in FOTO that is likely due to report of mixed compliance between session. Pt gave verbal understanding to consistency of HEP given reduced visit frequency. Pt still most limited with combined IR, ext, and ADD of the R shoulder when reaching BHB for self care and dressing activity. Pain appears to be well managed outside of IR BHB reaching. Pt is able to participate with reduced capacity exercise at the [facility] but is still not at [facility], as expected with adhesive capsulitis timeline. Pt to continue scheduling at reduced frequency with manual as needed and progression of exercise.  ?  ?Objective impairments include decreased ROM, decreased strength, hypomobility, increased muscle spasms, impaired flexibility, impaired UE functional use, improper body mechanics, postural dysfunction, and pain. These impairments are limiting patient from cleaning, community activity, driving, meal prep, occupation, laundry, yard work, shopping, and caregiver duties . Personal factors including Age, Fitness, Sex, Time since onset of injury/illness/exacerbation, and 1-2 comorbidities:    are also affecting patient's functional outcome. Patient will benefit from skilled PT to address above impairments and improve overall function. ?  ?REHAB POTENTIAL: Good ?  ?CLINICAL DECISION MAKING: Stable/uncomplicated ?  ?EVALUATION COMPLEXITY: Low ?  ?  ?GOALS: ?  ?  ?SHORT TERM GOALS: ?  ?STG Name Target Date Goal status  ?1 Pt will become independent with HEP in order to  demonstrate synthesis of PT education. ?  ?Baseline:  04/08/2021 MET  ?2 Pt will report at least 2 pt reduction on NPRS scale for pain in order to demonstrate functional improvement with household act

## 2021-06-25 ENCOUNTER — Telehealth: Payer: Self-pay | Admitting: Internal Medicine

## 2021-06-25 DIAGNOSIS — J22 Unspecified acute lower respiratory infection: Secondary | ICD-10-CM

## 2021-06-25 MED ORDER — ALBUTEROL SULFATE HFA 108 (90 BASE) MCG/ACT IN AERS: 2.0000 | INHALATION_SPRAY | Freq: Four times a day (QID) | RESPIRATORY_TRACT | 1 refills | Status: DC | PRN

## 2021-06-25 NOTE — Addendum Note (Signed)
Addended by: Elza Rafter D on: 06/25/2021 04:42 PM ? ? Modules accepted: Orders ? ?

## 2021-06-25 NOTE — Telephone Encounter (Signed)
Pt call and stated the cough medicine don't help and want a inhaler for albuterol (PROAIR HFA) 108 (90 Base) MCG/ACT inhaler  sent to  ?[place], [place] Phone:  [phone]  ?Fax:  [phone]  ?  ? ?

## 2021-06-25 NOTE — Telephone Encounter (Signed)
LVM notifying pt that the prescription she requested is at the pharmacy she requested. ?

## 2021-06-25 NOTE — Telephone Encounter (Signed)
Pt states cough worse at night or if she's talking to someone for a while. Intermittent wheezing noted. Denies other symptoms. ?Last refill of inhaler was 2019. Pt aware that since PCP out of office until Monday, will send to another provider for review for refill. ? ?Last OV with PCP for this issue 06/15/21: ?Acute respiratory infection - With cough and laryngitis. ?  ?Cough, unspecified type - Plan: POC COVID-19, POC Influenza A/B, DG Chest 2 View ?  ?Other chest pain ?Appears to be a viral illness although could have underlying allergy at this point no alarm symptoms although she has had any symptoms for about 10 days her cough is only been there for less than 5 days.  Expectant management follow-up with alarm signs or failure to improve after the weekend ?Options discussed we will try Tessalon Perles in addition to her hot liquids and voice rest. ?  ?-Patient advised to return or notify health care team  if  new concerns arise. ?Patient Instructions  ?Exam is reasusring and chest x ray clear.  ?  ?This acts like viral respiratory infection  flu like ( although  suspect you could aggravated with underlying allergy)  ?  ?Hot liquids  voice rest  .  ?  ?Expect  next week  cough to improve  but last another 1-2 weeks .  ?  ?Standley Brooking. Panosh M.D. ?

## 2021-07-13 ENCOUNTER — Telehealth: Payer: Self-pay | Admitting: Pharmacist

## 2021-07-13 NOTE — Chronic Care Management (AMB) (Signed)
? ? ?Chronic Care Management ?Pharmacy Assistant  ? ?Name: Debbie Marshall  MRN: [id number] DOB: 1945/06/18 ? ?Reason for Encounter: Disease State Diabetes Assessment Call ?  ?Conditions to be addressed/monitored: ?DMII ? ? ?Recent office visits:  ?06/15/2021 Shanon Ace MD - Patient was seen for Acute respiratory infection and additional issues. Started Benzonatate 100-200 mg times daily prn. Follow up if symptoms worsen or fail to improve as expected. ? ?03/08/2021 Shanon Ace MD - Patient was seen for Visit for preventive health examination and additional issues. No medication changes. Follow up in 3-4 months. ? ?Recent consult visits:  ?02/19/2021 Lynne Leader MD (sports med) - Patient was seen for chronic right shoulder pain. Referral to physical therapy. No medication changes. Follow up in 6 weeks.  ? ?Hospital visits:  ?None ? ?Medications: ?Outpatient Encounter Medications as of 07/13/2021  ?Medication Sig  ? ACCU-CHEK GUIDE test strip TEST TWICE DAILY  ? Accu-Chek Softclix Lancets lancets TEST TWICE DAILY AS  DIRECTED  ? albuterol (PROAIR HFA) 108 (90 Base) MCG/ACT inhaler Inhale 2 puffs into the lungs every 6 (six) hours as needed for wheezing or shortness of breath.  ? Ascorbic Acid (VITAMIN C) 500 MG CAPS Take 1 capsule by mouth daily.  ? benzonatate (TESSALON PERLES) 100 MG capsule Take 1-2 capsules (100-200 mg total) by mouth 3 (three) times daily as needed for cough.  ? Blood Glucose Monitoring Suppl (ACCU-CHEK GUIDE) w/Device KIT USE TO CHECK BLOOD SUGAR  TWICE DAILY  ? Cholecalciferol 1000 UNITS capsule Take 1 capsule (1,000 Units total) by mouth daily.  ? cyanocobalamin 2000 MCG tablet Take 2,500 mcg by mouth daily.  ? fluticasone (FLONASE) 50 MCG/ACT nasal spray Place 2 sprays into both nostrils daily. (Patient not taking: Reported on 03/17/2021)  ? levothyroxine (SYNTHROID) 50 MCG tablet TAKE 1 TABLET BY MOUTH  DAILY  ? LUTEIN PO Take 40 mg by mouth daily.  ? metFORMIN (GLUCOPHAGE-XR) 500 MG 24 hr tablet  TAKE 2 TABLETS BY MOUTH  DAILY  ? MULTIPLE VITAMIN PO Take 1 capsule by mouth daily.  ? Omega-3 Fatty Acids (FISH OIL) 435 MG CAPS Take 1 capsule by mouth in the morning and at bedtime.  ? simvastatin (ZOCOR) 40 MG tablet TAKE 1 TABLET BY MOUTH AT  BEDTIME  ? ?No facility-administered encounter medications on file as of 07/13/2021.  ?Fill History: ?ALBUTEROL HFA 90 MCG INHALER 06/25/2021 30  ? ?LEVOTHYROXINE SODIUM  50 MCG TABS 05/03/2021 90  ? ?SIMVASTATIN  40 MG TABS 05/03/2021 90  ? ?METFORMIN HYDROCHLORIDE ER  500 MG TB24 05/03/2021 90  ? ?Recent Relevant Labs: ?Lab Results  ?Component Value Date/Time  ? HGBA1C 6.3 03/01/2021 08:12 AM  ? HGBA1C 6.0 (A) 11/02/2020 10:07 AM  ? HGBA1C 5.9 (A) 06/10/2020 10:50 AM  ? HGBA1C 6.7 (H) 02/26/2020 12:54 PM  ? MICROALBUR <0.7 03/01/2021 08:12 AM  ? MICROALBUR 1.2 09/06/2019 10:16 AM  ?  ?Kidney Function ?Lab Results  ?Component Value Date/Time  ? CREATININE 0.73 03/01/2021 08:12 AM  ? CREATININE 0.57 08/12/2020 11:51 AM  ? CREATININE 0.70 02/26/2020 12:54 PM  ? GFR 81.41 03/01/2021 08:12 AM  ? GFRNONAA 87 02/26/2020 12:54 PM  ? GFRAA 100 02/26/2020 12:54 PM  ? ? ?Current antihyperglycemic regimen:  ?Metformin 500 mg two tablets once daily ? ?What recent interventions/DTPs have been made to improve glycemic control: No recent interventions.  ? ?Have there been any recent hospitalizations or ED visits since last visit with CPP? No recent hospital visits.  ? ?  Patient  hypoglycemic symptoms, including  ? ?Patient  hyperglycemic symptoms, including  ? ?How often are you checking your blood sugar?  ? ?What are your blood sugars ranging?  ?Fasting:  ?Before meals:  ?After meals:  ?Bedtime:  ?During the week, how often does your blood glucose drop below 70?  ? ?Are you checking your feet daily/regularly?  ? ?Adherence Review: ?Is the patient currently on a STATIN medication? Yes ?Is the patient currently on ACE/ARB medication? No ?Does the patient have >5 day gap between last  estimated fill dates? No ? ?Unable to reach patient after several attempts ? ?Care Gaps: ?AWV - scheduled 04/22/2022 ?Last BP - 140/82 on 06/15/2021 ?Last A1C - 6.3 on 03/01/2021 (future order pending) ? ?Star Rating Drugs: ?Simvastatin 40 mg - last filled 05/03/2021 90 DS at [facility] ?Metformin 500 mg  - last filled 05/03/2021 90 DS at [facility] ? ?Gennie Alma CMA  ?Clinical Pharmacist Assistant ?[phone] ? ?

## 2021-07-16 ENCOUNTER — Ambulatory Visit (HOSPITAL_BASED_OUTPATIENT_CLINIC_OR_DEPARTMENT_OTHER): Payer: Medicare Other | Attending: Family Medicine | Admitting: Physical Therapy

## 2021-07-16 ENCOUNTER — Encounter (HOSPITAL_BASED_OUTPATIENT_CLINIC_OR_DEPARTMENT_OTHER): Payer: Self-pay | Admitting: Physical Therapy

## 2021-07-16 DIAGNOSIS — M25511 Pain in right shoulder: Secondary | ICD-10-CM | POA: Insufficient documentation

## 2021-07-16 DIAGNOSIS — G8929 Other chronic pain: Secondary | ICD-10-CM | POA: Diagnosis not present

## 2021-07-16 DIAGNOSIS — M25611 Stiffness of right shoulder, not elsewhere classified: Secondary | ICD-10-CM | POA: Diagnosis not present

## 2021-07-16 DIAGNOSIS — M6281 Muscle weakness (generalized): Secondary | ICD-10-CM | POA: Insufficient documentation

## 2021-07-16 NOTE — Therapy (Signed)
?OUTPATIENT PHYSICAL THERAPY RE-CERTIFICATION NOTE ? ? ?  ? ? ?Patient Name: Debbie Marshall ?MRN: [id number] ?DOB:09/28/1945, 76 y.o., female ?Today's Date: 07/16/2021 ? ?PCP: Burnis Medin, MD ?REFERRING PROVIDER: Burnis Medin, MD ? ? PT End of Session - 07/16/21 1056   ? ? Visit Number 15   ? Number of Visits 22   ? Date for PT Re-Evaluation 09/13/21   ? Authorization Type UHC Medicare   ? PT Start Time 1020   ? PT Stop Time 1050   ? PT Time Calculation (min) 30 min   ? Activity Tolerance Patient tolerated treatment well;No increased pain   ? Behavior During Therapy [facility] for tasks assessed/performed   ? ?  ?  ? ?  ? ? ? ? ? ? ? ? ? ? ? ?Past Medical History:  ?Diagnosis Date  ? ABNORMAL EXAM-BILIARY TRACT 11/14/2008  ? ABNORMAL TRANSAMINASE, (LFT'S) 11/14/2008  ?  2005 US shows fatty liver  ? Allergy   ? Cancer [facility]) 1996  ? tongue cancer  ? Diabetes mellitus without complication ([place])   ? FASTING HYPERGLYCEMIA 09/07/2006  ? GERD 09/07/2006  ? HYPERLIPIDEMIA 09/07/2006  ? HYPOTHYROIDISM 10/30/2006  ? Nonspecific abnormal results of liver function study 09/07/2006  ? Normal nuclear stress test 2005  ? stress cardiolite   ? OSTEOARTHRITIS 09/07/2006  ? Positive PPD   ? [place]- shows positive after receiving  ? Salivary gland tumor 1996  ?   near tongue base removed  Dr Lucia Gaskins  ? VITAMIN D DEFICIENCY 08/14/2009  ? ?Past Surgical History:  ?Procedure Laterality Date  ? COLONOSCOPY    ? NASAL POLYP SURGERY  04/16/2020  ? POLYPECTOMY    ? SALIVARY GLAND SURGERY  1996  ? Tumor removed on back of tongue and salivary gland  ? ?Patient Active Problem List  ? Diagnosis Date Noted  ? Hip crepitus 02/27/2014  ? Visit for preventive health examination 10/28/2013  ? Nocturia 10/28/2013  ? Osteopenia 11/15/2012  ? AC (acromioclavicular) arthritis 11/10/2011  ? Cubital tunnel syndrome on right 10/14/2011  ? Arm pain, right 10/14/2011  ? Arthritis 10/05/2010  ? Physical exam, annual 06/02/2010  ? Diabetes mellitus type  2, uncomplicated ([place]) 07/68/0881  ? Positive PPD 06/02/2010  ? VITAMIN D DEFICIENCY 08/14/2009  ? ABNORMAL EXAM-BILIARY TRACT 11/14/2008  ? PAIN IN JOINT, HAND 07/22/2008  ? Hypothyroidism 10/30/2006  ? Hyperlipidemia 09/07/2006  ? GERD 09/07/2006  ? OSTEOARTHRITIS 09/07/2006  ? FASTING HYPERGLYCEMIA 09/07/2006  ? ? ? ? ?PCP: Panosh, Standley Brooking, MD ?  ?REFERRING PROVIDER: Burnis Medin, MD ?  ?REFERRING DIAG: M25.511,G89.29 (ICD-10-CM) - Chronic right shoulder pain ?  ?THERAPY DIAG:  ?Decreased right shoulder range of motion ?  ?Chronic right shoulder pain ?  ?Muscle weakness (generalized) ?  ?  ?ONSET DATE: 06/2020 ?  ?SUBJECTIVE:                                                                                                                                                                                     ?  ?  SUBJECTIVE STATEMENT: ?Pt states she has been doing more with her shoulder. Pt states the shoulder is moving much better. She has been mixed with compliance during HEP this week and started having some more discomfort. When she stretches, it moves much better. She is still doing classes at the [facility].  ? ?  ?PERTINENT HISTORY: ?DM2, polyarthralgi, OA ?  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  ?PAIN:  ?Are you having pain? Yes ?VAS scale: 1/10 posteriorly ?Pain location: R shoulder ?  ?PAIN TYPE: aching ?Pain description: intermittent  ?Aggravating factors: reaching behind back, dressing, keyboarding ?Relieving factors: patches, topical ointments, tylenol  ?  ?PRECAUTIONS: None ?  ?PATIENT GOALS : Pt states she would like to reach behind back normally without pain.  ?  ?OBJECTIVE:  ? ?  ?DIAGNOSTIC FINDINGS:  ?FINDINGS: ?There is no evidence of fracture or dislocation. There is no ?evidence of arthropathy or other focal bone abnormality. Soft ?tissues are unremarkable. ?  ?IMPRESSION: ?Negative. ?  ?Diagnostic Limited MSK Ultrasound of: Right shoulder ?Biceps tendon is intact in bicipital groove.  Small amount  of hypoechoic fluid tracks within biceps tendon sheath. ?Subscapularis tendon is intact and normal appearing ?Supraspinatus tendon is intact. ?Mild subacromial bursitis is present. ?Infraspinatus tendon is intact. ?AC joint degenerative appearing ?Impression: Subacromial bursitis ?  ? ?  ?PATIENT SURVEYS:  ?FOTO 55 ?44 D/C ?5 pts MCII ?  ?6th visit FOTO 1/3: 76 pts ? 14th visit FOTO 3/16 72 ?  ?A/PROM Right ?03/17/2021 Left ?03/17/2021 1/31 ?R R ?3/16  ?Shoulder flexion 120 p! 150 141  143  ?Shoulder extension 30 45 45 50  ?Shoulder abduction 110 p! 150 143 150  ?Shoulder adduction         ?Shoulder internal rotation BHB reach to glute p! WFL reach L2 BHB L1 BHB  ?Shoulder external rotation 40 55 50 50  ?(Blank rows = not tested) ?  ?UPPER EXTREMITY MMT: ?  ?MMT Right ?03/17/2021 Left ?03/17/2021 R ? ?3/16  ?Shoulder flexion 4/5 4+/5 4+/5  ?Shoulder extension       ?Shoulder abduction 4/5 4+/5 4+/5  ?Shoulder adduction 4/5 4+/5 4+/5  ?IR 4/5 4+/5 4+/5  ?(Blank rows = not tested) ? ? ? ?           ?TODAY'S TREATMENT:  ?  ?Joint mobs: Inf and post R GHJ grade IV in open pack;  ?Prone PA GHJ grade IIII ?STM infra ?  ?Review of HEP and self maintenance/pain relief/acceptable levels of pain with HEP and frequency of exercise; gym based strengthening with discussion of machine usage and techniques. ? ?HEP: ?Seated Shoulder Flexion Towel Slide at Table Top - 2 x daily - 7 x weekly - 1 sets - 10 reps - 5 hold ?Sleeper Stretch - 2 x daily - 7 x weekly - 1 sets - 10 reps - 10 hold ?Standing Shoulder Row with Anchored Resistance - 1 x daily - 7 x weekly - 3 sets - 10 reps ?Shoulder Abduction with Dumbbells - Thumbs Up - 1 x daily - 7 x weekly - 2 sets - 10 reps ?Standing Shoulder Internal Rotation Stretch with Dowel - 1 x daily - 7 x weekly - 2 sets - 10 reps - 5 hold ?Bilat shoulder ER YTB 2x10 with RTB provided ?  ?PATIENT EDUCATION: ?Education details: exam findings, importance of stretching/HEP consistency, exercise  progression, acceptable pain levels, HEP updates, ROM increases ?Person educated: Patient ?Education method: Explanation, Demonstration, Tactile cues, Verbal cues ?Education comprehension: verbalized understanding,  returned demonstration, verbal cues required, and tactile cues required ?  ?  ?HOME EXERCISE PROGRAM: ?Access Code: [email or web address] ?URL: [email or web address] ?Date: 05/11/2021 ?Prepared by: Daleen Bo ? ? ? ?  ?ASSESSMENT: ?  ?CLINICAL IMPRESSION: ?Pt continues to show progress at today's session with ROM. Pt is able to reaching symmetrical OH motion but still with frontal plane deviation due to capsular restriction. She is still stiff at the R GHJ but is able to improve OH lifting tasks. Pt had abolishment of her pain at today's visit following manual therapy and was able to increase resistance of her HEP and discussed the usage of machine based strengthening while at the [facility]. Pt feels she is able to maintain/progress her own mobility at this time and will plan to make the next session a D/C visit, unless there is a change in her pain or ROM. Pt is doing well with her frozen shoulder progression and likely able to independently manage if no change in status.  ?  ?Objective impairments include decreased ROM, decreased strength, hypomobility, increased muscle spasms, impaired flexibility, impaired UE functional use, improper body mechanics, postural dysfunction, and pain. These impairments are limiting patient from cleaning, community activity, driving, meal prep, occupation, laundry, yard work, shopping, and caregiver duties . Personal factors including Age, Fitness, Sex, Time since onset of injury/illness/exacerbation, and 1-2 comorbidities:    are also affecting patient's functional outcome. Patient will benefit from skilled PT to address above impairments and improve overall function. ?  ?REHAB POTENTIAL: Good ?  ?CLINICAL DECISION MAKING: Stable/uncomplicated ?  ?EVALUATION COMPLEXITY: Low ?   ?  ?GOALS: ?  ?  ?SHORT TERM GOALS: ?  ?STG Name Target Date Goal status  ?1 Pt will become independent with HEP in order to demonstrate synthesis of PT education. ?  ?Baseline:  04/08/2021 MET  ?2 Pt will repo

## 2021-07-29 ENCOUNTER — Ambulatory Visit (HOSPITAL_BASED_OUTPATIENT_CLINIC_OR_DEPARTMENT_OTHER): Payer: Medicare Other | Admitting: Physical Therapy

## 2021-07-29 ENCOUNTER — Encounter (HOSPITAL_BASED_OUTPATIENT_CLINIC_OR_DEPARTMENT_OTHER): Payer: Self-pay | Admitting: Physical Therapy

## 2021-07-29 DIAGNOSIS — G8929 Other chronic pain: Secondary | ICD-10-CM

## 2021-07-29 DIAGNOSIS — M25511 Pain in right shoulder: Secondary | ICD-10-CM | POA: Diagnosis not present

## 2021-07-29 DIAGNOSIS — M25611 Stiffness of right shoulder, not elsewhere classified: Secondary | ICD-10-CM | POA: Diagnosis not present

## 2021-07-29 DIAGNOSIS — M6281 Muscle weakness (generalized): Secondary | ICD-10-CM | POA: Diagnosis not present

## 2021-07-29 NOTE — Therapy (Signed)
?PHYSICAL THERAPY DISCHARGE SUMMARY ? ?Visits from Start of Care: 16 ? ?Plan: ?Patient agrees to discharge.  Patient goals were met. Patient is being discharged due to meeting the stated rehab goals.    ? ? ? ? ? ?  ? ? ?Patient Name: Debbie Marshall ?MRN: [id number] ?DOB:January 16, 1946, 76 y.o., female ?Today's Date: 07/29/2021 ? ?PCP: Burnis Medin, MD ?REFERRING PROVIDER: Burnis Medin, MD ? ? PT End of Session - 07/29/21 1114   ? ? Visit Number 16   ? Number of Visits 22   ? Date for PT Re-Evaluation 09/13/21   ? Authorization Type [employer] Medicare   ? PT Start Time 1108   arrives late  ? PT Stop Time 1135   ? PT Time Calculation (min) 27 min   ? Activity Tolerance Patient tolerated treatment well;No increased pain   ? Behavior During Therapy [facility] for tasks assessed/performed   ? ?  ?  ? ?  ? ? ? ? ? ? ? ? ? ? ? ? ?Past Medical History:  ?Diagnosis Date  ? ABNORMAL EXAM-BILIARY TRACT 11/14/2008  ? ABNORMAL TRANSAMINASE, (LFT'S) 11/14/2008  ?  2005 US shows fatty liver  ? Allergy   ? Cancer [facility]) 1996  ? tongue cancer  ? Diabetes mellitus without complication ([place])   ? FASTING HYPERGLYCEMIA 09/07/2006  ? GERD 09/07/2006  ? HYPERLIPIDEMIA 09/07/2006  ? HYPOTHYROIDISM 10/30/2006  ? Nonspecific abnormal results of liver function study 09/07/2006  ? Normal nuclear stress test 2005  ? stress cardiolite   ? OSTEOARTHRITIS 09/07/2006  ? Positive PPD   ? [place] vaccine [place]- shows positive after receiving  ? Salivary gland tumor 1996  ?   near tongue base removed  Dr Lucia Gaskins  ? VITAMIN D DEFICIENCY 08/14/2009  ? ?Past Surgical History:  ?Procedure Laterality Date  ? COLONOSCOPY    ? NASAL POLYP SURGERY  04/16/2020  ? POLYPECTOMY    ? SALIVARY GLAND SURGERY  1996  ? Tumor removed on back of tongue and salivary gland  ? ?Patient Active Problem List  ? Diagnosis Date Noted  ? Hip crepitus 02/27/2014  ? Visit for preventive health examination 10/28/2013  ? Nocturia 10/28/2013  ? Osteopenia 11/15/2012  ? AC (acromioclavicular) arthritis  11/10/2011  ? Cubital tunnel syndrome on right 10/14/2011  ? Arm pain, right 10/14/2011  ? Arthritis 10/05/2010  ? Physical exam, annual 06/02/2010  ? Diabetes mellitus type 2, uncomplicated ([place]) 13/11/6576  ? Positive PPD 06/02/2010  ? VITAMIN D DEFICIENCY 08/14/2009  ? ABNORMAL EXAM-BILIARY TRACT 11/14/2008  ? PAIN IN JOINT, HAND 07/22/2008  ? Hypothyroidism 10/30/2006  ? Hyperlipidemia 09/07/2006  ? GERD 09/07/2006  ? OSTEOARTHRITIS 09/07/2006  ? FASTING HYPERGLYCEMIA 09/07/2006  ? ? ? ? ?PCP: Panosh, Standley Brooking, MD ?  ?REFERRING PROVIDER: Burnis Medin, MD ?  ?REFERRING DIAG: M25.511,G89.29 (ICD-10-CM) - Chronic right shoulder pain ?  ?THERAPY DIAG:  ?Decreased right shoulder range of motion ?  ?Chronic right shoulder pain ?  ?Muscle weakness (generalized) ?  ?  ?ONSET DATE: 06/2020 ?  ?SUBJECTIVE:                                                                                                                                                                                     ?  ?  SUBJECTIVE STATEMENT: ?Pt states she is actually swimming at the [facility] again. She has minor pain with sleeping on the R side. She has been using machines at the [facility] and working on strengthening ? ?  ?PERTINENT HISTORY: ?DM2, polyarthralgi, OA ?  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  ?PAIN:  ?Are you having pain? Yes ?VAS scale: 0/10 posteriorly ?Pain location: R shoulder ?  ?PAIN TYPE: aching ?Pain description: intermittent  ?Aggravating factors: reaching behind back, dressing, keyboarding ?Relieving factors: patches, topical ointments, tylenol  ?  ?PRECAUTIONS: None ?  ?PATIENT GOALS : Pt states she would like to reach behind back normally without pain.  ?  ?OBJECTIVE:  ? ?  ?DIAGNOSTIC FINDINGS:  ?FINDINGS: ?There is no evidence of fracture or dislocation. There is no ?evidence of arthropathy or other focal bone abnormality. Soft ?tissues are unremarkable. ?  ?IMPRESSION: ?Negative. ?  ?Diagnostic Limited MSK Ultrasound of: Right  shoulder ?Biceps tendon is intact in bicipital groove.  Small amount of hypoechoic fluid tracks within biceps tendon sheath. ?Subscapularis tendon is intact and normal appearing ?Supraspinatus tendon is intact. ?Mild subacromial bursitis is present. ?Infraspinatus tendon is intact. ?AC joint degenerative appearing ?Impression: Subacromial bursitis ?  ? ?  ?PATIENT SURVEYS:  ?FOTO 55 ?50 D/C ?5 pts MCII ?  ?6th visit FOTO 1/3: 76 pts ? 14th visit FOTO 3/16 74 ?  ?A/PROM Right ?03/17/2021 Left ?03/17/2021 1/31 ?R R ?3/16 R 4/20  ?Shoulder flexion 120 p! 150 141  143 155  ?Shoulder extension 30 45 45 50 50  ?Shoulder abduction 110 p! 150 143 150 165  ?Shoulder adduction          ?Shoulder internal rotation BHB reach to glute p! WFL reach L2 BHB L1 BHB T8 BHB  ?Shoulder external rotation 40 55 50 50 55  ?(Blank rows = not tested) ?  ?UPPER EXTREMITY MMT: ?  ?MMT Right ?03/17/2021 Left ?03/17/2021 R ? ?3/16 R 4/20  ?Shoulder flexion 4/5 4+/5 4+/5 4+/5  ?Shoulder extension        ?Shoulder abduction 4/5 4+/5 4+/5 4+/5  ?Shoulder adduction 4/5 4+/5 4+/5 4+/5  ?IR 4/5 4+/5 4+/5 4+/5  ?(Blank rows = not tested) ? ? ? ?           ?TODAY'S TREATMENT:  ?  ?Joint mobs: Inf and post R GHJ grade IV in open pack;  ?Prone PA GHJ grade IIII ?STM infra ?  ?Review of HEP and acceptable levels of pain; gym based strengthening with discussion of machine usage and techniques; return to swimming and gardening ? ?HEP: ?Seated Shoulder Flexion Towel Slide at Table Top - 2 x daily - 7 x weekly - 1 sets - 10 reps - 5 hold ?Sleeper Stretch - 2 x daily - 7 x weekly - 1 sets - 10 reps - 10 hold ?Standing Shoulder Row with Anchored Resistance - 1 x daily - 7 x weekly - 3 sets - 10 reps ?Shoulder Abduction with Dumbbells - Thumbs Up - 1 x daily - 7 x weekly - 2 sets - 10 reps ?Standing Shoulder Internal Rotation Stretch with Dowel - 1 x daily - 7 x weekly - 2 sets - 10 reps - 5 hold ?Bilat shoulder ER YTB 2x10 with RTB provided ?  ?PATIENT  EDUCATION: ?Education details: exam findings, exercise/HEP tapering, importance of stretching/HEP consistency, exercise progression, acceptable pain levels, HEP  ?Person educated: Patient ?Education method: Explanation, Demonstration, Tactile cues, Verbal cues ?Education comprehension: verbalized understanding, returned demonstration, verbal cues required,  and tactile cues required ?  ?  ?HOME EXERCISE PROGRAM: ?Access Code: [email or web address] ?URL: [email or web address] ?Date: 05/11/2021 ?Prepared by: Daleen Bo ? ? ? ?  ?ASSESSMENT: ?  ?CLINICAL IMPRESSION: ?Pt has met all stated rehab goals at this time. Pt has exceeded predicted outcomes on FOTO and shows good independence with management of adhesive capsulitis. Pt has symmetrical ROM with only IR BHB reach being at a deficit compared to L. Pt has updated HEP and has good understanding of exercise taper/integration with [employer] exercise. D/C this episode at this time.  ?  ?Objective impairments include decreased ROM, decreased strength, hypomobility, increased muscle spasms, impaired flexibility, impaired UE functional use, improper body mechanics, postural dysfunction, and pain. These impairments are limiting patient from cleaning, community activity, driving, meal prep, occupation, laundry, yard work, shopping, and caregiver duties . Personal factors including Age, Fitness, Sex, Time since onset of injury/illness/exacerbation, and 1-2 comorbidities:    are also affecting patient's functional outcome. ?  ?REHAB POTENTIAL: Good ?  ?CLINICAL DECISION MAKING: Stable/uncomplicated ?  ?EVALUATION COMPLEXITY: Low ?  ?  ?GOALS: ?  ?  ?SHORT TERM GOALS: ?  ?STG Name Target Date Goal status  ?1 Pt will become independent with HEP in order to demonstrate synthesis of PT education. ?  ?Baseline:  04/08/2021 MET  ?2 Pt will report at least 2 pt reduction on NPRS scale for pain in order to demonstrate functional improvement with household activity, self care, and ADL. ?   ?Baseline:  05/06/2021 MET  ?3 Pt will be able to demonstrate full R OH ROM without pain in order to demonstrate functional improvement in UE function for self-care and house hold duties.  ?Baseline: 05/06/2021 MET  ?

## 2021-08-12 ENCOUNTER — Encounter (HOSPITAL_BASED_OUTPATIENT_CLINIC_OR_DEPARTMENT_OTHER): Payer: Medicare Other | Admitting: Physical Therapy

## 2021-09-17 ENCOUNTER — Other Ambulatory Visit: Payer: Self-pay | Admitting: Internal Medicine

## 2021-09-17 DIAGNOSIS — Z1231 Encounter for screening mammogram for malignant neoplasm of breast: Secondary | ICD-10-CM

## 2021-10-04 ENCOUNTER — Ambulatory Visit
Admission: RE | Admit: 2021-10-04 | Discharge: 2021-10-04 | Disposition: A | Discharge status: Home / Self Care | Payer: Medicare Other | Source: Ambulatory Visit | Attending: Internal Medicine | Admitting: Internal Medicine

## 2021-10-04 DIAGNOSIS — Z1231 Encounter for screening mammogram for malignant neoplasm of breast: Secondary | ICD-10-CM

## 2021-10-13 ENCOUNTER — Telehealth: Payer: Self-pay | Admitting: Internal Medicine

## 2021-10-13 NOTE — Telephone Encounter (Signed)
Pt would like a TB test for work. Can the MD put in an order so that she can be scheduled?  Please advise.

## 2021-10-13 NOTE — Telephone Encounter (Signed)
Can Order  either a a quantiferon gold   blood tests OR PPD  skin test.   (If she is due for blood work  we can order an updated blood panel in addition to the quantiferon gold . )Ask what she prefers

## 2021-10-13 NOTE — Telephone Encounter (Signed)
Last Ov 06/15/21 Please advise

## 2021-10-14 NOTE — Telephone Encounter (Signed)
Pt is sch for PPD test on 10-18-2021

## 2021-10-14 NOTE — Telephone Encounter (Signed)
Left voicemail to call the office.

## 2021-10-18 ENCOUNTER — Ambulatory Visit (INDEPENDENT_AMBULATORY_CARE_PROVIDER_SITE_OTHER): Payer: Medicare Other | Admitting: *Deleted

## 2021-10-18 DIAGNOSIS — Z23 Encounter for immunization: Secondary | ICD-10-CM

## 2021-10-20 ENCOUNTER — Other Ambulatory Visit: Payer: Medicare Other

## 2021-10-20 ENCOUNTER — Ambulatory Visit: Payer: Medicare Other

## 2021-10-20 DIAGNOSIS — Z9289 Personal history of other medical treatment: Secondary | ICD-10-CM | POA: Insufficient documentation

## 2021-10-20 LAB — TB SKIN TEST
Induration: 10 mm
TB Skin Test: POSITIVE

## 2021-10-20 NOTE — Addendum Note (Signed)
Addended by: Westley Hummer B on: 10/20/2021 03:18 PM   Modules accepted: Orders

## 2021-11-09 DIAGNOSIS — E119 Type 2 diabetes mellitus without complications: Secondary | ICD-10-CM | POA: Diagnosis not present

## 2021-11-24 ENCOUNTER — Encounter: Payer: Self-pay | Admitting: Family Medicine

## 2021-11-24 ENCOUNTER — Ambulatory Visit (INDEPENDENT_AMBULATORY_CARE_PROVIDER_SITE_OTHER): Payer: Medicare Other | Admitting: Family Medicine

## 2021-11-24 VITALS — BP 130/66 | HR 62 | Temp 97.9°F | Ht 59.0 in | Wt 122.1 lb

## 2021-11-24 DIAGNOSIS — M25541 Pain in joints of right hand: Secondary | ICD-10-CM

## 2021-11-24 NOTE — Progress Notes (Signed)
Established Patient Office Visit  Subjective   Patient ID: Debbie Marshall, female    DOB: 1945-12-20  Age: 76 y.o. MRN: [id number]  Chief Complaint  Patient presents with   Hand Pain    Patient complains of finger pain, x2 weeks, Patient denies any known injury     HPI   Patient is seen as a work in with left fourth finger PIP joint pain for the past couple of weeks.  She is right-hand dominant.  Denies any known injury.  Denies any other general arthralgias.  She was concerned about things like "rheumatoid arthritis ".  Has not noted any warmth.  No erythema.  No ecchymosis.  She does do some weight lifting and recently used a new piece of equipment that had a much larger circumference bar and with gripping that she thinks she may have aggravated something.  No specific injury though.  No trigger finger.  Past Medical History:  Diagnosis Date   ABNORMAL EXAM-BILIARY TRACT 11/14/2008   ABNORMAL TRANSAMINASE, (LFT'S) 11/14/2008    2005 US shows fatty liver   Allergy    Cancer ([place]) 1996   tongue cancer   Diabetes mellitus without complication ([place])    FASTING HYPERGLYCEMIA 09/07/2006   GERD 09/07/2006   HYPERLIPIDEMIA 09/07/2006   HYPOTHYROIDISM 10/30/2006   Nonspecific abnormal results of liver function study 09/07/2006   Normal nuclear stress test 2005   stress cardiolite    OSTEOARTHRITIS 09/07/2006   Positive PPD    [place] vaccine [place]- shows positive after receiving   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  Dr Lucia Gaskins   VITAMIN D DEFICIENCY 08/14/2009   Past Surgical History:  Procedure Laterality Date   COLONOSCOPY     NASAL POLYP SURGERY  04/16/2020   POLYPECTOMY     [place]   Tumor removed on back of tongue and salivary gland    reports that she has never smoked. She has never used smokeless tobacco. She reports current alcohol use of about 1.0 standard drink of alcohol per week. She reports that she does not use drugs. family  history includes Arthritis in her brother and mother; Colon cancer in an other family member; Gout in her brother; Rheum arthritis in her brother; Stroke (age of onset: 80) in her mother. No Known Allergies  Review of Systems  Constitutional:  Negative for chills and fever.  Neurological:  Negative for weakness.      Objective:     BP 130/66 (BP Location: Left Arm, Patient Position: Sitting, Cuff Size: Normal)   Pulse 62   Temp 97.9 F (36.6 C) (Oral)   Ht '4\' 11"'$  (1.499 m)   Wt 122 lb 1.6 oz (55.4 kg)   SpO2 98%   BMI 24.66 kg/m    Physical Exam Vitals reviewed.  Constitutional:      Appearance: Normal appearance.  Cardiovascular:     Rate and Rhythm: Normal rate and regular rhythm.  Musculoskeletal:     Comments: Left ring finger reveals some mild swelling of the PIP joint.  No significant tenderness to palpation.  No warmth.  No erythema.  No ecchymosis.  Full range of motion.  No trigger finger.  Full range of motion both wrists with no warmth or erythema or visible swelling.  Neurological:     Mental Status: She is alert.      No results found for any visits on 11/24/21.    The  10-year ASCVD risk score (Arnett DK, et al., 2019) is: 27.2%    Assessment & Plan:   Pain involving left fourth digit PIP joint.  No injury.  Suspect she may have overuse synovitis from recent weight lifting. -Consider trial of over-the-counter Voltaren/diclofenac gel -We did offer x-ray but decided since no injury really not clinically indicated at this time. -She is aware inflammatory synovitis may take several weeks to improve or resolve.  Follow-up for any changes.  No follow-ups on file.    Carolann Littler, MD

## 2021-11-24 NOTE — Patient Instructions (Signed)
Suspect synovitis of left ring finger PIP joint  Consider trial of over the counter Voltaren or Diclofenac gel 3-4 times daily.

## 2022-03-20 ENCOUNTER — Other Ambulatory Visit: Payer: Self-pay | Admitting: Internal Medicine

## 2022-03-30 ENCOUNTER — Other Ambulatory Visit: Payer: Self-pay | Admitting: Internal Medicine

## 2022-04-22 ENCOUNTER — Ambulatory Visit (INDEPENDENT_AMBULATORY_CARE_PROVIDER_SITE_OTHER): Payer: Medicare Other

## 2022-04-22 VITALS — BP 120/62 | HR 65 | Temp 98.1°F | Ht 59.0 in | Wt 122.5 lb

## 2022-04-22 DIAGNOSIS — Z0000 Encounter for general adult medical examination without abnormal findings: Secondary | ICD-10-CM

## 2022-04-22 DIAGNOSIS — Z23 Encounter for immunization: Secondary | ICD-10-CM | POA: Diagnosis not present

## 2022-04-22 NOTE — Patient Instructions (Addendum)
Debbie Marshall , Thank you for taking time to come for your Medicare Wellness Visit. I appreciate your ongoing commitment to your health goals. Please review the following plan we discussed and let me know if I can assist you in the future.   These are the goals we discussed:  Goals       Exercise 3x per week (30 min per time)      Lose weight (pt-stated)      I want to lose about 2 or 3lbs        This is a list of the screening recommended for you and due dates:  Health Maintenance  Topic Date Due   Eye exam for diabetics  07/29/2021   Hemoglobin A1C  08/29/2021   Yearly kidney function blood test for diabetes  03/01/2022   Yearly kidney health urinalysis for diabetes  03/01/2022   Complete foot exam   03/08/2022   COVID-19 Vaccine (4 - 2023-24 season) 05/08/2022*   Zoster (Shingles) Vaccine (1 of 2) 07/22/2022*   Medicare Annual Wellness Visit  04/23/2023   DTaP/Tdap/Td vaccine (2 - Td or Tdap) 06/13/2023   Colon Cancer Screening  09/23/2023   Mammogram  10/05/2023   Pneumonia Vaccine  Completed   Flu Shot  Completed   DEXA scan (bone density measurement)  Completed   Hepatitis C Screening: USPSTF Recommendation to screen - Ages 74-79 yo.  Completed   HPV Vaccine  Aged Out  *Topic was postponed. The date shown is not the original due date.    Advanced directives: Advance directive discussed with you today. Even though you declined this today, please call our office should you change your mind, and we can give you the proper paperwork for you to fill out.   Conditions/risks identified: None  Next appointment: Follow up in one year for your annual wellness visit     Preventive Care 65 Years and Older, Female Preventive care refers to lifestyle choices and visits with your health care provider that can promote health and wellness. What does preventive care include? A yearly physical exam. This is also called an annual well check. Dental exams once or twice a year. Routine eye  exams. Ask your health care provider how often you should have your eyes checked. Personal lifestyle choices, including: Daily care of your teeth and gums. Regular physical activity. Eating a healthy diet. Avoiding tobacco and drug use. Limiting alcohol use. Practicing safe sex. Taking low-dose aspirin every day. Taking vitamin and mineral supplements as recommended by your health care provider. What happens during an annual well check? The services and screenings done by your health care provider during your annual well check will depend on your age, overall health, lifestyle risk factors, and family history of disease. Counseling  Your health care provider may ask you questions about your: Alcohol use. Tobacco use. Drug use. Emotional well-being. Home and relationship well-being. Sexual activity. Eating habits. History of falls. Memory and ability to understand (cognition). Work and work Statistician. Reproductive health. Screening  You may have the following tests or measurements: Height, weight, and BMI. Blood pressure. Lipid and cholesterol levels. These may be checked every 5 years, or more frequently if you are over 87 years old. Skin check. Lung cancer screening. You may have this screening every year starting at age 58 if you have a 30-pack-year history of smoking and currently smoke or have quit within the past 15 years. Fecal occult blood test (FOBT) of the stool. You may have this  test every year starting at age 73. Flexible sigmoidoscopy or colonoscopy. You may have a sigmoidoscopy every 5 years or a colonoscopy every 10 years starting at age 32. Hepatitis C blood test. Hepatitis B blood test. Sexually transmitted disease (STD) testing. Diabetes screening. This is done by checking your blood sugar (glucose) after you have not eaten for a while (fasting). You may have this done every 1-3 years. Bone density scan. This is done to screen for osteoporosis. You may have  this done starting at age 26. Mammogram. This may be done every 1-2 years. Talk to your health care provider about how often you should have regular mammograms. Talk with your health care provider about your test results, treatment options, and if necessary, the need for more tests. Vaccines  Your health care provider may recommend certain vaccines, such as: Influenza vaccine. This is recommended every year. Tetanus, diphtheria, and acellular pertussis (Tdap, Td) vaccine. You may need a Td booster every 10 years. Zoster vaccine. You may need this after age 40. Pneumococcal 13-valent conjugate (PCV13) vaccine. One dose is recommended after age 43. Pneumococcal polysaccharide (PPSV23) vaccine. One dose is recommended after age 75. Talk to your health care provider about which screenings and vaccines you need and how often you need them. This information is not intended to replace advice given to you by your health care provider. Make sure you discuss any questions you have with your health care provider. Document Released: 04/24/2015 Document Revised: 12/16/2015 Document Reviewed: 01/27/2015 Elsevier Interactive Patient Education  2017 [place] Prevention in the Home Falls can cause injuries. They can happen to people of all ages. There are many things you can do to make your home safe and to help prevent falls. What can I do on the outside of my home? Regularly fix the edges of walkways and driveways and fix any cracks. Remove anything that might make you trip as you walk through a door, such as a raised step or threshold. Trim any bushes or trees on the path to your home. Use bright outdoor lighting. Clear any walking paths of anything that might make someone trip, such as rocks or tools. Regularly check to see if handrails are loose or broken. Make sure that both sides of any steps have handrails. Any raised decks and porches should have guardrails on the edges. Have any leaves,  snow, or ice cleared regularly. Use sand or salt on walking paths during winter. Clean up any spills in your garage right away. This includes oil or grease spills. What can I do in the bathroom? Use night lights. Install grab bars by the toilet and in the tub and shower. Do not use towel bars as grab bars. Use non-skid mats or decals in the tub or shower. If you need to sit down in the shower, use a plastic, non-slip stool. Keep the floor dry. Clean up any water that spills on the floor as soon as it happens. Remove soap buildup in the tub or shower regularly. Attach bath mats securely with double-sided non-slip rug tape. Do not have throw rugs and other things on the floor that can make you trip. What can I do in the bedroom? Use night lights. Make sure that you have a light by your bed that is easy to reach. Do not use any sheets or blankets that are too big for your bed. They should not hang down onto the floor. Have a firm chair that has side arms. You can  use this for support while you get dressed. Do not have throw rugs and other things on the floor that can make you trip. What can I do in the kitchen? Clean up any spills right away. Avoid walking on wet floors. Keep items that you use a lot in easy-to-reach places. If you need to reach something above you, use a strong step stool that has a grab bar. Keep electrical cords out of the way. Do not use floor polish or wax that makes floors slippery. If you must use wax, use non-skid floor wax. Do not have throw rugs and other things on the floor that can make you trip. What can I do with my stairs? Do not leave any items on the stairs. Make sure that there are handrails on both sides of the stairs and use them. Fix handrails that are broken or loose. Make sure that handrails are as long as the stairways. Check any carpeting to make sure that it is firmly attached to the stairs. Fix any carpet that is loose or worn. Avoid having throw  rugs at the top or bottom of the stairs. If you do have throw rugs, attach them to the floor with carpet tape. Make sure that you have a light switch at the top of the stairs and the bottom of the stairs. If you do not have them, ask someone to add them for you. What else can I do to help prevent falls? Wear shoes that: Do not have high heels. Have rubber bottoms. Are comfortable and fit you well. Are closed at the toe. Do not wear sandals. If you use a stepladder: Make sure that it is fully opened. Do not climb a closed stepladder. Make sure that both sides of the stepladder are locked into place. Ask someone to hold it for you, if possible. Clearly mark and make sure that you can see: Any grab bars or handrails. First and last steps. Where the edge of each step is. Use tools that help you move around (mobility aids) if they are needed. These include: Canes. Walkers. Scooters. Crutches. Turn on the lights when you go into a dark area. Replace any light bulbs as soon as they burn out. Set up your furniture so you have a clear path. Avoid moving your furniture around. If any of your floors are uneven, fix them. If there are any pets around you, be aware of where they are. Review your medicines with your doctor. Some medicines can make you feel dizzy. This can increase your chance of falling. Ask your doctor what other things that you can do to help prevent falls. This information is not intended to replace advice given to you by your health care provider. Make sure you discuss any questions you have with your health care provider. Document Released: 01/22/2009 Document Revised: 09/03/2015 Document Reviewed: 05/02/2014 Elsevier Interactive Patient Education  2017 [employer].

## 2022-04-22 NOTE — Progress Notes (Signed)
Subjective:   Debbie Marshall is a 77 y.o. female who presents for Medicare Annual (Subsequent) preventive examination.  Review of Systems      Cardiac Risk Factors include: advanced age (>89mn, >>56women);diabetes mellitus     Objective:    Today's Vitals   04/22/22 1335  BP: 120/62  Pulse: 65  Temp: 98.1 F (36.7 C)  TempSrc: Oral  SpO2: 96%  Weight: 122 lb 8 oz (55.6 kg)  Height: '4\' 11"'$  (1.499 m)   Body mass index is 24.74 kg/m.     04/22/2022    1:50 PM 04/21/2021    2:02 PM 03/17/2021    8:08 AM 04/30/2020    3:44 PM 12/30/2014    8:48 AM 10/23/2013   10:41 AM  Advanced Directives  Does Patient Have a Medical Advance Directive? No No No No No Patient does not have advance directive  Would patient like information on creating a medical advance directive? No - Patient declined No - Patient declined No - Patient declined No - Patient declined      Current Medications (verified) Outpatient Encounter Medications as of 04/22/2022  Medication Sig   ACCU-CHEK GUIDE test strip TEST TWICE DAILY   Accu-Chek Softclix Lancets lancets TEST TWICE DAILY AS  DIRECTED   albuterol (PROAIR HFA) 108 (90 Base) MCG/ACT inhaler Inhale 2 puffs into the lungs every 6 (six) hours as needed for wheezing or shortness of breath.   Ascorbic Acid (VITAMIN C) 500 MG CAPS Take 1 capsule by mouth daily.   benzonatate (TESSALON PERLES) 100 MG capsule Take 1-2 capsules (100-200 mg total) by mouth 3 (three) times daily as needed for cough.   Blood Glucose Monitoring Suppl (ACCU-CHEK GUIDE) w/Device KIT USE TO CHECK BLOOD SUGAR  TWICE DAILY   Cholecalciferol 1000 UNITS capsule Take 1 capsule (1,000 Units total) by mouth daily.   cyanocobalamin 2000 MCG tablet Take 2,500 mcg by mouth daily.   levothyroxine (SYNTHROID) 50 MCG tablet TAKE 1 TABLET BY MOUTH DAILY   LUTEIN PO Take 40 mg by mouth daily.   metFORMIN (GLUCOPHAGE-XR) 500 MG 24 hr tablet TAKE 2 TABLETS BY MOUTH  DAILY   MULTIPLE VITAMIN PO Take 1  capsule by mouth daily.   Omega-3 Fatty Acids (FISH OIL) 435 MG CAPS Take 1 capsule by mouth in the morning and at bedtime.   simvastatin (ZOCOR) 40 MG tablet TAKE 1 TABLET BY MOUTH AT  BEDTIME   No facility-administered encounter medications on file as of 04/22/2022.    Allergies (verified) Patient has no known allergies.   History: Past Medical History:  Diagnosis Date   ABNORMAL EXAM-BILIARY TRACT 11/14/2008   ABNORMAL TRANSAMINASE, (LFT'S) 11/14/2008    2005 [place] fatty liver   Allergy    Cancer ([place] 1996   tongue cancer   Diabetes mellitus without complication ([place]    FASTING HYPERGLYCEMIA 09/07/2006   GERD 09/07/2006   HYPERLIPIDEMIA 09/07/2006   HYPOTHYROIDISM 10/30/2006   Nonspecific abnormal results of liver function study 09/07/2006   Normal nuclear stress test 2005   stress cardiolite    OSTEOARTHRITIS 09/07/2006   Positive PPD    received vaccine in home country- shows positive after receiving   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  Dr nLucia Gaskins  VITAMIN D DEFICIENCY 08/14/2009   Past Surgical History:  Procedure Laterality Date   COLONOSCOPY     NASAL POLYP SURGERY  04/16/2020   POLYPECTOMY     SALIVARY  GLAND SURGERY  1996   Tumor removed on back of tongue and salivary gland   Family History  Problem Relation Age of Onset   Stroke Mother 53   Arthritis Mother    Rheum arthritis Brother    Arthritis Brother    Gout Brother    Colon cancer Other    Esophageal cancer Neg Hx    Stomach cancer Neg Hx    Rectal cancer Neg Hx    Colon polyps Neg Hx    Social History   Socioeconomic History   Marital status: Single    Spouse name: Not on file   Number of children: Not on file   Years of education: Not on file   Highest education level: Not on file  Occupational History   Occupation: PhD Professor  Tobacco Use   Smoking status: Never   Smokeless tobacco: Never  Vaping Use   Vaping Use: Never used  Substance and Sexual Activity    Alcohol use: Yes    Alcohol/week: 1.0 standard drink of alcohol    Types: 1 Glasses of wine per week    Comment: rarely   Drug use: No   Sexual activity: Not on file  Other Topics Concern   Not on file  Social History Narrative   HH of 2 Father and her    cat     PhD professor in information systems    No tobacco or alcohol exercising regularly currently   Social Determinants of Health   Financial Resource Strain: Low Risk  (04/22/2022)   Overall Financial Resource Strain (CARDIA)    Difficulty of Paying Living Expenses: Not hard at all  Food Insecurity: No Food Insecurity (04/22/2022)   Hunger Vital Sign    Worried About Running Out of Food in the Last Year: Never true    Ran Out of Food in the Last Year: Never true  Transportation Needs: No Transportation Needs (04/22/2022)   PRAPARE - Hydrologist (Medical): No    Lack of Transportation (Non-Medical): No  Physical Activity: Sufficiently Active (04/22/2022)   Exercise Vital Sign    Days of Exercise per Week: 5 days    Minutes of Exercise per Session: 50 min  Stress: No Stress Concern Present (04/22/2022)   [place]    Feeling of Stress : Not at all  Social Connections: Moderately Integrated (04/22/2022)   Social Connection and Isolation Panel [NHANES]    Frequency of Communication with Friends and Family: More than three times a week    Frequency of Social Gatherings with Friends and Family: More than three times a week    Attends Religious Services: More than 4 times per year    Active Member of [employer] or Organizations: Yes    Attends Music therapist: More than 4 times per year    Marital Status: Never married    Tobacco Counseling Counseling given: Not Answered   Clinical Intake:  Pre-visit preparation completed: NoNutrition Risk Assessment:  Has the patient had any N/V/D within the last 2 months?  No  Does  the patient have any non-healing wounds?  No  Has the patient had any unintentional weight loss or weight gain?  No   Diabetes:  Is the patient diabetic?  Yes  If diabetic, was a CBG obtained today?  Yes CBG 115 Taken by patient Did the patient bring in their glucometer from home?  No  How often  do you monitor your CBG's? Daily.   Financial Strains and Diabetes Management:  Are you having any financial strains with the device, your supplies or your medication? No .  Does the patient want to be seen by Chronic Care Management for management of their diabetes?  No  Would the patient like to be referred to a Nutritionist or for Diabetic Management?  No   Diabetic Exams:  Diabetic Eye Exam: Completed No. Overdue for diabetic eye exam. Pt has been advised about the importance in completing this exam. A referral has been placed today. Message sent to referral coordinator for scheduling purposes. Advised pt to expect a call from office referred to regarding appt.  Diabetic Foot Exam: Completed No. Pt has been advised about the importance in completing this exam. Pt is scheduled for diabetic foot exam on Followed by PCP.    Pain : No/denies pain     BMI - recorded: 24.74 Nutritional Status: BMI of 19-24  Normal Nutritional Risks: None Diabetes: Yes CBG done?: Yes CBG resulted in Enter/ Edit results?: Yes (CBG 115 Taken by patient) Did pt. bring in CBG monitor from home?: No  How often do you need to have someone help you when you read instructions, pamphlets, or other written materials from your doctor or pharmacy?: 1 - Never  Diabetic?  Yes  Interpreter Needed?: No  Information entered by :: Rolene Arbour LPN   Activities of Daily Living    04/22/2022    1:49 PM 04/22/2022    6:08 AM  In your present state of health, do you have any difficulty performing the following activities:  Hearing? 0 0  Vision? 0 0  Difficulty concentrating or making decisions? 0 0  Walking or  climbing stairs? 0 0  Dressing or bathing? 0 0  Doing errands, shopping? 0 0  Preparing Food and eating ? N N  Using the Toilet? N N  In the past six months, have you accidently leaked urine? N N  Do you have problems with loss of bowel control? N N  Managing your Medications? N N  Managing your Finances? N N  Housekeeping or managing your Housekeeping? N N    Patient Care Team: Panosh, Standley Brooking, MD as PCP - General Earnie Larsson, [facility] as Pharmacist (Pharmacist)  Indicate any recent Medical Services you may have received from other than [facility] providers in the past year (date may be approximate).     Assessment:   This is a routine wellness examination for Debbie Marshall.  Hearing/Vision screen Hearing Screening - Comments:: Denies hearing difficulties   Vision Screening - Comments:: Wears rx glasses - up to date with routine eye exams with  [facility] care  Dietary issues and exercise activities discussed: Exercise limited by: None identified   Goals Addressed               This Visit's Progress     Lose weight (pt-stated)        I want to lose about 2 or 3lbs       Depression Screen    04/22/2022    1:35 PM 04/21/2021    1:55 PM 03/08/2021   10:13 AM 04/30/2020    3:47 PM 02/26/2020   11:42 AM 01/25/2019   10:03 AM 12/21/2017    9:46 AM  PHQ 2/9 Scores  PHQ - 2 Score 0 0 2 0 0 0 0  PHQ- 9 Score  0 4 0       Fall Risk  04/22/2022    1:49 PM 04/22/2022    6:08 AM 04/21/2021    2:12 PM 04/21/2021    1:58 PM 04/30/2020    3:46 PM  Fall Risk   Falls in the past year? 0 0 0 0 0  Number falls in past yr: 0 0 0 0 0  Injury with Fall? 0 0 0 0 0  Risk for fall due to : No Fall Risks    No Fall Risks  Follow up Falls prevention discussed    Falls evaluation completed;Falls prevention discussed    FALL RISK PREVENTION PERTAINING TO THE HOME:  Any stairs in or around the home? No  If so, are there any without handrails? No  Home free of loose throw rugs in walkways,  pet beds, electrical cords, etc? Yes  Adequate lighting in your home to reduce risk of falls? Yes   ASSISTIVE DEVICES UTILIZED TO PREVENT FALLS:  Life alert? No  Use of a cane, walker or w/c? No  Grab bars in the bathroom? No  Shower chair or bench in shower? Yes  Elevated toilet seat or a handicapped toilet? No   TIMED UP AND GO:  Was the test performed? Yes .  Length of time to ambulate 10 feet: 10 sec.   Gait steady and fast without use of assistive device  Cognitive Function:        04/22/2022    1:51 PM 04/21/2021    1:59 PM  6CIT Screen  What Year? 0 points 0 points  What month? 0 points 0 points  What time? 0 points 0 points  Count back from 20 0 points 0 points  Months in reverse 0 points 0 points  Repeat phrase 0 points 0 points  Total Score 0 points 0 points    Immunizations Immunization History  Administered Date(s) Administered   Fluad Quad(high Dose 65+) 01/25/2019, 02/26/2020, 03/01/2021, 04/22/2022   Influenza Split 04/24/2012   Influenza Whole 02/19/2010   Influenza, High Dose Seasonal PF 04/02/2015, 05/27/2016, 12/06/2016, 12/21/2017   Influenza,inj,Quad PF,6+ Mos 04/18/2013, 02/27/2014   Influenza,inj,quad, With Preservative 01/07/2019   PFIZER(Purple Top)SARS-COV-2 Vaccination 06/20/2019, 07/10/2019, 03/11/2020   PPD Test 07/02/2018, 10/18/2021   Pneumococcal Conjugate-13 11/03/2014   Pneumococcal Polysaccharide-23 10/24/2012   Tdap 06/12/2013   Zoster, Live 06/12/2013    TDAP status: Up to date  Flu Vaccine status: Completed at today's visit  Pneumococcal vaccine status: Up to date  Covid-19 vaccine status: Completed vaccines  Qualifies for Shingles Vaccine? Yes   Zostavax completed No   Shingrix Completed?: No.    Education has been provided regarding the importance of this vaccine. Patient has been advised to call insurance company to determine out of pocket expense if they have not yet received this vaccine. Advised may also receive  vaccine at local pharmacy or Health Dept. Verbalized acceptance and understanding.  Screening Tests Health Maintenance  Topic Date Due   OPHTHALMOLOGY EXAM  07/29/2021   HEMOGLOBIN A1C  08/29/2021   Diabetic kidney evaluation - eGFR measurement  03/01/2022   Diabetic kidney evaluation - Urine ACR  03/01/2022   FOOT EXAM  03/08/2022   COVID-19 Vaccine (4 - 2023-24 season) 05/08/2022 (Originally 12/10/2021)   Zoster Vaccines- Shingrix (1 of 2) 07/22/2022 (Originally 06/13/1964)   Medicare Annual Wellness (AWV)  04/23/2023   DTaP/Tdap/Td (2 - Td or Tdap) 06/13/2023   COLONOSCOPY (Pts 45-49yr Insurance coverage will need to be confirmed)  09/23/2023   MAMMOGRAM  10/05/2023   Pneumonia Vaccine 65+  Years old  Completed   INFLUENZA VACCINE  Completed   DEXA SCAN  Completed   Hepatitis C Screening  Completed   HPV VACCINES  Aged Out    Health Maintenance  Health Maintenance Due  Topic Date Due   OPHTHALMOLOGY EXAM  07/29/2021   HEMOGLOBIN A1C  08/29/2021   Diabetic kidney evaluation - eGFR measurement  03/01/2022   Diabetic kidney evaluation - Urine ACR  03/01/2022   FOOT EXAM  03/08/2022    Colorectal cancer screening: Type of screening: Colonoscopy. Completed 09/22/20. Repeat every 3 years  Mammogram status: Completed 10/04/21. Repeat every year  Bone Density status: Completed 10/09/20. Results reflect: Bone density results: OSTEOPOROSIS. Repeat every   years.  Lung Cancer Screening: (Low Dose CT Chest recommended if Age 23-80 years, 30 pack-year currently smoking OR have quit w/in 15years.) does not qualify.     Additional Screening:  Hepatitis C Screening: does qualify; Completed 09/16/08  Vision Screening: Recommended annual ophthalmology exams for early detection of glaucoma and other disorders of the eye. Is the patient up to date with their annual eye exam?  Yes  Who is the provider or what is the name of the office in which the patient attends annual eye exams? [facility] If pt is not established with a provider, would they like to be referred to a provider to establish care? No .   Dental Screening: Recommended annual dental exams for proper oral hygiene  Community Resource Referral / Chronic Care Management: CRR required this visit?  No   CCM required this visit?  No      Plan:     I have personally reviewed and noted the following in the patient's chart:   Medical and social history Use of alcohol, tobacco or illicit drugs  Current medications and supplements including opioid prescriptions. Patient is not currently taking opioid prescriptions. Functional ability and status Nutritional status Physical activity Advanced directives List of other physicians Hospitalizations, surgeries, and ER visits in previous 12 months Vitals Screenings to include cognitive, depression, and falls Referrals and appointments  In addition, I have reviewed and discussed with patient certain preventive protocols, quality metrics, and best practice recommendations. A written personalized care plan for preventive services as well as general preventive health recommendations were provided to patient.     Criselda Peaches, LPN   [id number]   Nurse Notes: Patient due Diabetic kidney evaluation-eGFR  and Urine ACR measurement also Hemoglobin A1C

## 2022-05-31 ENCOUNTER — Other Ambulatory Visit: Payer: Self-pay | Admitting: Internal Medicine

## 2022-05-31 DIAGNOSIS — E119 Type 2 diabetes mellitus without complications: Secondary | ICD-10-CM

## 2022-06-15 ENCOUNTER — Encounter: Payer: Self-pay | Admitting: Internal Medicine

## 2022-06-15 ENCOUNTER — Ambulatory Visit (INDEPENDENT_AMBULATORY_CARE_PROVIDER_SITE_OTHER): Payer: Medicare Other | Admitting: Internal Medicine

## 2022-06-15 VITALS — BP 144/82 | HR 60 | Temp 98.1°F | Ht 58.75 in | Wt 123.2 lb

## 2022-06-15 DIAGNOSIS — E039 Hypothyroidism, unspecified: Secondary | ICD-10-CM

## 2022-06-15 DIAGNOSIS — E118 Type 2 diabetes mellitus with unspecified complications: Secondary | ICD-10-CM

## 2022-06-15 DIAGNOSIS — Z0000 Encounter for general adult medical examination without abnormal findings: Secondary | ICD-10-CM

## 2022-06-15 DIAGNOSIS — M2550 Pain in unspecified joint: Secondary | ICD-10-CM

## 2022-06-15 DIAGNOSIS — R109 Unspecified abdominal pain: Secondary | ICD-10-CM

## 2022-06-15 DIAGNOSIS — Z79899 Other long term (current) drug therapy: Secondary | ICD-10-CM

## 2022-06-15 DIAGNOSIS — E785 Hyperlipidemia, unspecified: Secondary | ICD-10-CM | POA: Diagnosis not present

## 2022-06-15 DIAGNOSIS — E538 Deficiency of other specified B group vitamins: Secondary | ICD-10-CM

## 2022-06-15 DIAGNOSIS — E559 Vitamin D deficiency, unspecified: Secondary | ICD-10-CM | POA: Diagnosis not present

## 2022-06-15 LAB — CBC WITH DIFFERENTIAL/PLATELET
Basophils Absolute: 0 10*3/uL (ref 0.0–0.1)
Basophils Relative: 0.7 % (ref 0.0–3.0)
Eosinophils Absolute: 0.2 10*3/uL (ref 0.0–0.7)
Eosinophils Relative: 3.6 % (ref 0.0–5.0)
HCT: 45.4 % (ref 36.0–46.0)
Hemoglobin: 14.9 g/dL (ref 12.0–15.0)
Lymphocytes Relative: 36 % (ref 12.0–46.0)
Lymphs Abs: 2 10*3/uL (ref 0.7–4.0)
MCHC: 32.9 g/dL (ref 30.0–36.0)
MCV: 90.8 fl (ref 78.0–100.0)
Monocytes Absolute: 0.3 10*3/uL (ref 0.1–1.0)
Monocytes Relative: 5.3 % (ref 3.0–12.0)
Neutro Abs: 3.1 10*3/uL (ref 1.4–7.7)
Neutrophils Relative %: 54.4 % (ref 43.0–77.0)
Platelets: 221 10*3/uL (ref 150.0–400.0)
RBC: 5 Mil/uL (ref 3.87–5.11)
RDW: 13.4 % (ref 11.5–15.5)
WBC: 5.7 10*3/uL (ref 4.0–10.5)

## 2022-06-15 LAB — URINALYSIS, ROUTINE W REFLEX MICROSCOPIC
Bilirubin Urine: NEGATIVE
Ketones, ur: NEGATIVE
Leukocytes,Ua: NEGATIVE
Nitrite: NEGATIVE
Specific Gravity, Urine: 1.025 (ref 1.000–1.030)
Total Protein, Urine: NEGATIVE
Urine Glucose: NEGATIVE
Urobilinogen, UA: 0.2 (ref 0.0–1.0)
pH: 7 (ref 5.0–8.0)

## 2022-06-15 LAB — HEPATIC FUNCTION PANEL
ALT: 21 U/L (ref 0–35)
AST: 23 U/L (ref 0–37)
Albumin: 4.3 g/dL (ref 3.5–5.2)
Alkaline Phosphatase: 59 U/L (ref 39–117)
Bilirubin, Direct: 0.1 mg/dL (ref 0.0–0.3)
Total Bilirubin: 0.6 mg/dL (ref 0.2–1.2)
Total Protein: 7 g/dL (ref 6.0–8.3)

## 2022-06-15 LAB — SEDIMENTATION RATE: Sed Rate: 8 mm/hr (ref 0–30)

## 2022-06-15 LAB — LIPID PANEL
Cholesterol: 165 mg/dL (ref 0–200)
HDL: 59.4 mg/dL (ref >39.00)
LDL Cholesterol: 78 mg/dL (ref 0–99)
NonHDL: 105.86
Total CHOL/HDL Ratio: 3
Triglycerides: 139 mg/dL (ref 0.0–149.0)
VLDL: 27.8 mg/dL (ref 0.0–40.0)

## 2022-06-15 LAB — MICROALBUMIN / CREATININE URINE RATIO
Creatinine,U: 105.5 mg/dL
Microalb Creat Ratio: 0.9 mg/g (ref 0.0–30.0)
Microalb, Ur: 0.9 mg/dL (ref 0.0–1.9)

## 2022-06-15 LAB — BASIC METABOLIC PANEL
BUN: 15 mg/dL (ref 6–23)
CO2: 27 mEq/L (ref 19–32)
Calcium: 10 mg/dL (ref 8.4–10.5)
Chloride: 104 mEq/L (ref 96–112)
Creatinine, Ser: 0.72 mg/dL (ref 0.40–1.20)
GFR: 82.03 mL/min (ref >60.00)
Glucose, Bld: 111 mg/dL — ABNORMAL HIGH (ref 70–99)
Potassium: 4.1 mEq/L (ref 3.5–5.1)
Sodium: 140 mEq/L (ref 135–145)

## 2022-06-15 LAB — VITAMIN D 25 HYDROXY (VIT D DEFICIENCY, FRACTURES): VITD: 18.37 ng/mL — ABNORMAL LOW (ref 30.00–100.00)

## 2022-06-15 LAB — TSH: TSH: 2.54 u[IU]/mL (ref 0.35–5.50)

## 2022-06-15 LAB — HEMOGLOBIN A1C: Hgb A1c MFr Bld: 6.8 % — ABNORMAL HIGH (ref 4.6–6.5)

## 2022-06-15 LAB — T4, FREE: Free T4: 0.85 ng/dL (ref 0.60–1.60)

## 2022-06-15 LAB — VITAMIN B12: Vitamin B-12: 247 pg/mL (ref 211–911)

## 2022-06-15 NOTE — Patient Instructions (Signed)
If bp remains elevated  140/90 and above  we can add medication. Send in readings after  attention to diet sodium and exercise .  Lab today   I think the pain on the left is probabby hip back   but if  persistent or progressive we can do more evaluation   to be sure .

## 2022-06-15 NOTE — Progress Notes (Signed)
Chief Complaint  Patient presents with   Annual Exam    HPI: Patient  Debbie Marshall  77 y.o. comes in today for Preventive Health Care visit  Counseled.Debbie Marshall  comes in today for follow up of  multiple medical problems.   DM:  BG  sugars   were   up  in am but under 140  off meds ( metformin) HLD still taking simvastatin Thyroid:: dialy 50 mcg  BP rising but ok. Intermittnet pain left lateral hip groin ? Pelvis  but  k with walking and gets better  no assoc sx ,  hx of right back ls are and radiation to foot ( stable) hx of r groin pain .  Not sure if important   caues  no assoc gi  GU sx  Vit D  4 mos off     Health Maintenance  Topic Date Due   OPHTHALMOLOGY EXAM  07/29/2021   FOOT EXAM  03/08/2022   COVID-19 Vaccine (4 - 2023-24 season) 07/01/2022 (Originally 12/10/2021)   Zoster Vaccines- Shingrix (1 of 2) 07/22/2022 (Originally 06/13/1964)   HEMOGLOBIN A1C  12/16/2022   Medicare Annual Wellness (AWV)  04/23/2023   DTaP/Tdap/Td (2 - Td or Tdap) 06/13/2023   Diabetic kidney evaluation - eGFR measurement  06/15/2023   Diabetic kidney evaluation - Urine ACR  06/15/2023   COLONOSCOPY (Pts 45-40yr Insurance coverage will need to be confirmed)  09/23/2023   Pneumonia Vaccine 77 Years old  Completed   INFLUENZA VACCINE  Completed   DEXA SCAN  Completed   Hepatitis C Screening  Completed   HPV VACCINES  Aged Out   Health Maintenance Review LIFESTYLE:  Exercise:   Tobacco/ETS:n Alcohol: n Sugar beverages: n Sleep:y irreg  father gets up to use br some  Drug use: no HH of  2 cat and elderly father doing pretty well.  Work:    ROS:  GEN/ HEENT: No fever, significant weight changes sweats headaches vision problems hearing changes, CV/ PULM; No chest pain shortness of breath cough, syncope,edema  change in exercise tolerance. GI /GU: No adominal pain, vomiting, change in bowel habits. No blood in the stool. No significant GU symptoms. SKIN/HEME: ,no acute skin rashes  suspicious lesions or bleeding. No lymphadenopathy, nodules, masses.  NEURO/ PSYCH:  No neurologic signs such as weakness numbness. No depression anxiety. IMM/ Allergy: No unusual infections.  Allergy .   REST of 12 system review negative except as per HPI   Past Medical History:  Diagnosis Date   ABNORMAL EXAM-BILIARY TRACT 11/14/2008   ABNORMAL TRANSAMINASE, (LFT'S) 11/14/2008    [place]   Allergy    [place]   t[place]   Di[place] without complication [place]    FASTING HYPERGLYCEMIA 09/07/2006   GERD 09/07/2006   HYPERLIPIDEMIA 09/07/2006   HYPOTHYROIDISM 10/30/2006   Nonspecific abnormal results of liver function study 09/07/2006   Normal nuclear stress test 2005   stress cardiolite    OSTEOARTHRITIS 09/07/2006   Positive PPD    [place] vaccine [place]- shows positive after receiving   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  Dr Debbie Marshall  VITAMIN D DEFICIENCY 08/14/2009    Past Surgical History:  Procedure Laterality Date   COLONOSCOPY     NASAL POLYP SURGERY  04/16/2020   [place]  Tumor removed on back of tongue and  salivary gland    Family History  Problem Relation Age of Onset   Stroke Mother 71   Arthritis Mother    Rheum arthritis Brother    Arthritis Brother    Gout Brother    Colon cancer Other    Esophageal cancer Neg Hx    Stomach cancer Neg Hx    Rectal cancer Neg Hx    Colon polyps Neg Hx     Social History   Socioeconomic History   Marital status: Single    Spouse name: Not on file   Number of children: Not on file   Years of education: Not on file   Highest education level: Not on file  Occupational History   Occupation: PhD Professor  Tobacco Use   Smoking status: Never   Smokeless tobacco: Never  Vaping Use   Vaping Use: Never used  Substance and Sexual Activity   Alcohol use: Yes    Alcohol/week: 1.0 standard drink of alcohol    Types: 1  Glasses of wine per week    Comment: rarely   Drug use: No   Sexual activity: Not on file  Other Topics Concern   Not on file  Social History Narrative   HH of 2 Father and her    cat     PhD professor in information systems    No tobacco or alcohol exercising regularly currently   Social Determinants of Health   Financial Resource Strain: Low Risk  (04/22/2022)   Overall Financial Resource Strain (CARDIA)    Difficulty of Paying Living Expenses: Not hard at all  Food Insecurity: No Food Insecurity (04/22/2022)   Hunger Vital Sign    Worried About Running Out of Food in the Last Year: Never true    Ran Out of Food in the Last Year: Never true  Transportation Needs: No Transportation Needs (04/22/2022)   PRAPARE - Hydrologist (Medical): No    Lack of Transportation (Non-Medical): No  Physical Activity: Sufficiently Active (04/22/2022)   Exercise Vital Sign    Days of Exercise per Week: 5 days    Minutes of Exercise per Session: 50 min  Stress: No Stress Concern Present (04/22/2022)   [place]    Feeling of Stress : Not at all  Social Connections: Moderately Integrated (04/22/2022)   Social Connection and Isolation Panel [NHANES]    Frequency of Communication with Friends and Family: More than three times a week    Frequency of Social Gatherings with Friends and Family: More than three times a week    Attends Religious Services: More than 4 times per year    Active Member of [employer] or Organizations: Yes    Attends Music therapist: More than 4 times per year    Marital Status: Never married    Outpatient Medications Prior to Visit  Medication Sig Dispense Refill   ACCU-CHEK GUIDE test strip TEST TWICE DAILY 200 strip 3   Accu-Chek Softclix Lancets lancets TEST TWICE DAILY AS DIRECTED 200 each 3   Blood Glucose Monitoring Suppl (ACCU-CHEK GUIDE) w/Device KIT USE TO CHECK  BLOOD SUGAR  TWICE DAILY 1 kit 1   levothyroxine (SYNTHROID) 50 MCG tablet TAKE 1 TABLET BY MOUTH DAILY 90 tablet 3   metFORMIN (GLUCOPHAGE-XR) 500 MG 24 hr tablet TAKE 2 TABLETS BY MOUTH  DAILY 180 tablet 3   simvastatin (ZOCOR) 40 MG tablet TAKE 1 TABLET BY MOUTH AT  BEDTIME 90 tablet 3   Cholecalciferol 1000 UNITS capsule Take 1 capsule (1,000 Units total) by mouth daily. 90 capsule 3   cyanocobalamin 2000 MCG tablet Take 2,500 mcg by mouth daily.     albuterol (PROAIR HFA) 108 (90 Base) MCG/ACT inhaler Inhale 2 puffs into the lungs every 6 (six) hours as needed for wheezing or shortness of breath. 1 each 1   Ascorbic Acid (VITAMIN C) 500 MG CAPS Take 1 capsule by mouth daily.     benzonatate (TESSALON PERLES) 100 MG capsule Take 1-2 capsules (100-200 mg total) by mouth 3 (three) times daily as needed for cough. 24 capsule 1   LUTEIN PO Take 40 mg by mouth daily.     MULTIPLE VITAMIN PO Take 1 capsule by mouth daily.     Omega-3 Fatty Acids (FISH OIL) 435 MG CAPS Take 1 capsule by mouth in the morning and at bedtime.     No facility-administered medications prior to visit.     EXAM:  BP (!) 144/82 (BP Location: Left Arm, Cuff Size: Normal)   Pulse 60   Temp 98.1 F (36.7 C) (Oral)   Ht 4' 10.75" (1.492 m)   Wt 123 lb 3.2 oz (55.9 kg)   SpO2 96%   BMI 25.10 kg/m   Body mass index is 25.1 kg/m. Wt Readings from Last 3 Encounters:  06/15/22 123 lb 3.2 oz (55.9 kg)  04/22/22 122 lb 8 oz (55.6 kg)  11/24/21 122 lb 1.6 oz (55.4 kg)    Physical Exam: Vital signs reviewed [id number] is a well-developed well-nourished alert cooperative    who appearsr stated age in no acute distress.  HEENT: normocephalic atraumatic , Eyes: PERRL EOM's full, conjunctiva clear, Nares: paten,t no deformity discharge or tenderness., Ears: no deformity EAC's clear TMs with normal landmarks. Mouth: clear OP, no lesions, edema.  Moist mucous membranes. Dentition in adequate repair. NECK: supple without  masses, thyromegaly or bruits. CHEST/PULM:  Clear to auscultation and percussion breath sounds equal no wheeze , rales or rhonchi. No chest wall deformities or tenderness. Breast: normal by inspection . No dimpling, discharge, masses, tenderness or discharge . CV: PMI is nondisplaced, S1 S2 no gallops, murmurs, rubs. Peripheral pulses are full without delay.No JVD .  ABDOMEN: Bowel sounds normal nontender  No guard or rebound, no hepato splenomegal no CVA tenderness.   Extremtities:  No clubbing cyanosis or edema, no acute joint swelling or redness no focal atrophy NEURO:  Oriented x3, cranial nerves 3-12 appear to be intact, no obvious focal weakness,gait within normal limits no abnormal reflexes or asymmetrical SKIN: No acute rashes normal turgor, color, no bruising or petechiae.  PSYCH: Oriented, good eye contact, no obvious depression anxiety, cognition and judgment appear normal. LN: no cervical axillary inguinal adenopathy Diabetic Foot Exam - Simple   Simple Foot Form  06/15/2022 11:28 AM  Visual Inspection No deformities, no ulcerations, no other skin breakdown bilaterally: Yes Sensation Testing Intact to touch and monofilament testing bilaterally: Yes Pulse Check Posterior Tibialis and Dorsalis pulse intact bilaterally: Yes Comments Has  some dec right lateral foot  stable     Lab Results  Component Value Date   WBC 5.7 06/15/2022   HGB 14.9 06/15/2022   HCT 45.4 06/15/2022   PLT 221.0 06/15/2022   GLUCOSE 111 (H) 06/15/2022   CHOL 165 06/15/2022   TRIG 139.0 06/15/2022   HDL 59.40 06/15/2022   LDLDIRECT 163.6 10/23/2006   LDLCALC 78 06/15/2022   ALT 21 06/15/2022  AST 23 06/15/2022   NA 140 06/15/2022   K 4.1 06/15/2022   CL 104 06/15/2022   CREATININE 0.72 06/15/2022   BUN 15 06/15/2022   CO2 27 06/15/2022   TSH 2.54 06/15/2022   INR 1.0 ratio 11/14/2008   HGBA1C 6.8 (H) 06/15/2022   MICROALBUR 0.9 06/15/2022    BP Readings from Last 3 Encounters:   06/15/22 (!) 144/82  04/22/22 120/62  11/24/21 130/66    Lab plan  reviewed with patient   ASSESSMENT AND PLAN:  Discussed the following assessment and plan:    ICD-10-CM   1. Visit for preventive health examination  Z00.00     2. Medication management  123456 Basic metabolic panel    CBC with Differential/Platelet    Hemoglobin A1c    Hepatic function panel    Lipid panel    TSH    T4, free    Microalbumin / creatinine urine ratio    Urinalysis, Routine w reflex microscopic    Vitamin D, 25-hydroxy    Vitamin B12    Sedimentation rate    3. Hyperlipidemia, unspecified hyperlipidemia type  99991111 Basic metabolic panel    CBC with Differential/Platelet    Hemoglobin A1c    Hepatic function panel    Lipid panel    TSH    T4, free    Microalbumin / creatinine urine ratio    Urinalysis, Routine w reflex microscopic    Vitamin D, 25-hydroxy    Vitamin B12    Sedimentation rate    4. Hypothyroidism, unspecified type  0000000 Basic metabolic panel    CBC with Differential/Platelet    Hemoglobin A1c    Hepatic function panel    Lipid panel    TSH    T4, free    Microalbumin / creatinine urine ratio    Urinalysis, Routine w reflex microscopic    Vitamin D, 25-hydroxy    Vitamin B12    Sedimentation rate    5. Low serum vitamin B12  0000000 Basic metabolic panel    CBC with Differential/Platelet    Hemoglobin A1c    Hepatic function panel    Lipid panel    TSH    T4, free    Microalbumin / creatinine urine ratio    Urinalysis, Routine w reflex microscopic    Vitamin D, 25-hydroxy    Vitamin B12    Sedimentation rate    6. Controlled type 2 diabetes mellitus with complication, without long-term current use of insulin (HCC)  [id number] Basic metabolic panel    CBC with Differential/Platelet    Hemoglobin A1c    Hepatic function panel    Lipid panel    TSH    T4, free    Microalbumin / creatinine urine ratio    Urinalysis, Routine w reflex microscopic    Vitamin  D, 25-hydroxy    Vitamin B12    Sedimentation rate    7. Vitamin D deficiency  [id number] Basic metabolic panel    CBC with Differential/Platelet    Hemoglobin A1c    Hepatic function panel    Lipid panel    TSH    T4, free    Microalbumin / creatinine urine ratio    Urinalysis, Routine w reflex microscopic    Vitamin D, 25-hydroxy    Vitamin B12    Sedimentation rate    8. Arthralgia, unspecified joint  99991111 Basic metabolic panel    CBC with Differential/Platelet    Hemoglobin A1c  Hepatic function panel    Lipid panel    TSH    T4, free    Microalbumin / creatinine urine ratio    Urinalysis, Routine w reflex microscopic    Vitamin D, 25-hydroxy    Vitamin B12    Sedimentation rate    9. Side pain  R10.9    left intermittent could be ms referred back or hip will follow if  pers progressive consider further eval she wll let us know    Monitoring labs off  metformin and vit d  b Will follow  Continue lifestyle intervention healthy eating and exercise .  In interim    Return for depending on results 3-6 months.  Patient Care Team: Donnette Macmullen, Standley Brooking, MD as PCP - General Earnie Larsson, [facility] as Pharmacist (Pharmacist) Patient Instructions  If bp remains elevated  140/90 and above  we can add medication. Send in readings after  attention to diet sodium and exercise .  Lab today   I think the pain on the left is probabby hip back   but if  persistent or progressive we can do more evaluation   to be sure .     Standley Brooking. Haeven Nickle M.D.

## 2022-06-15 NOTE — Progress Notes (Signed)
A1c up to 6.8 acceptable if below 7 , all other labs good or in range  except . VIT D is low and b12 low normal . Suggest take b complex vitamin otc .  Send in rx vit d 50000 iu to take weekly x 12  disp 12   when completed   then begion to take  VIt d otc 2000 iu per day    we can repeat level at   a 4-6 months fu

## 2022-06-16 ENCOUNTER — Other Ambulatory Visit: Payer: Self-pay

## 2022-06-16 DIAGNOSIS — E538 Deficiency of other specified B group vitamins: Secondary | ICD-10-CM

## 2022-06-16 DIAGNOSIS — E559 Vitamin D deficiency, unspecified: Secondary | ICD-10-CM

## 2022-06-16 MED ORDER — VITAMIN D (ERGOCALCIFEROL) 1.25 MG (50000 UNIT) PO CAPS: 50000.0000 [IU] | ORAL_CAPSULE | ORAL | 0 refills | Status: DC

## 2022-06-19 ENCOUNTER — Encounter: Payer: Self-pay | Admitting: Internal Medicine

## 2022-07-26 ENCOUNTER — Telehealth: Payer: Self-pay | Admitting: Internal Medicine

## 2022-07-26 DIAGNOSIS — M2550 Pain in unspecified joint: Secondary | ICD-10-CM

## 2022-07-26 NOTE — Telephone Encounter (Signed)
Pt requesting referral to a specialist for her arthritis

## 2022-07-26 NOTE — Telephone Encounter (Signed)
do referral to rheumatologist  for help with multiple joint pain  arthritis evlautaion

## 2022-07-28 NOTE — Telephone Encounter (Signed)
Pt is made aware that some one will contact her soon to schedule an appt.

## 2022-07-28 NOTE — Addendum Note (Signed)
Addended byVickii Chafe on: 07/28/2022 11:31 AM   Modules accepted: Orders

## 2022-08-09 ENCOUNTER — Ambulatory Visit (INDEPENDENT_AMBULATORY_CARE_PROVIDER_SITE_OTHER): Payer: Medicare Other | Admitting: Internal Medicine

## 2022-08-09 ENCOUNTER — Ambulatory Visit (INDEPENDENT_AMBULATORY_CARE_PROVIDER_SITE_OTHER): Payer: Medicare Other

## 2022-08-09 VITALS — BP 142/78 | HR 60 | Temp 97.8°F | Ht 58.75 in | Wt 123.2 lb

## 2022-08-09 DIAGNOSIS — G8929 Other chronic pain: Secondary | ICD-10-CM

## 2022-08-09 DIAGNOSIS — Z8261 Family history of arthritis: Secondary | ICD-10-CM

## 2022-08-09 DIAGNOSIS — M79644 Pain in right finger(s): Secondary | ICD-10-CM

## 2022-08-09 DIAGNOSIS — M7989 Other specified soft tissue disorders: Secondary | ICD-10-CM | POA: Diagnosis not present

## 2022-08-09 DIAGNOSIS — M19041 Primary osteoarthritis, right hand: Secondary | ICD-10-CM | POA: Diagnosis not present

## 2022-08-09 DIAGNOSIS — R030 Elevated blood-pressure reading, without diagnosis of hypertension: Secondary | ICD-10-CM | POA: Diagnosis not present

## 2022-08-09 NOTE — Progress Notes (Signed)
Chief Complaint  Patient presents with   Thumb Pain    Pt c/o r thumb pain. No injury. Suspect from arthritis. Going on for 4-5 wks.     HPI: Debbie Marshall 77 y.o. come in for  4-5 weeks or pain crescendo at base of right thumb  and sometimes  clicking noise   no injury noted is r hand dominant . Cannot perform twisting motion opening . And hard to turn ignition in car  ( uses middle fingers) . Ring finger is better  on its own.  Less swelling   Appt with rheum isn't until August  Fam hx of RA in mom and brother . BP at home has been ok when checked  below 130  ROS: See pertinent positives and negatives per HPI.  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  Dr Ezzard Standing   VITAMIN D DEFICIENCY 08/14/2009    Family History  Problem Relation Age of Onset   Stroke Mother 35   Arthritis Mother    Rheum arthritis Brother    Arthritis Brother    Gout Brother    Colon cancer Other    Esophageal cancer Neg Hx    Stomach cancer Neg Hx    Rectal cancer Neg Hx    Colon polyps Neg Hx     Social History   Socioeconomic History   Marital status: Single    Spouse name: Not on file   Number of children: Not on file   Years of education: Not on file   Highest education level: Doctorate  Occupational History   Occupation: PhD Professor  Tobacco Use   Smoking status: Never   Smokeless tobacco:  Never  Vaping Use   Vaping Use: Never used  Substance and Sexual Activity   Alcohol use: Yes    Alcohol/week: 1.0 standard drink of alcohol    Types: 1 Glasses of wine per week    Comment: rarely   Drug use: No   Sexual activity: Not on file  Other Topics Concern   Not on file  Social History Narrative   HH of 2 Father and her    cat     PhD professor in information systems    No tobacco or alcohol exercising regularly currently   Social Determinants of Health   Financial Resource Strain: Low Risk  (08/09/2022)   Overall Financial Resource Strain (CARDIA)    Difficulty of Paying Living Expenses: Not hard at all  Food Insecurity: No Food Insecurity (08/09/2022)   Hunger Vital Sign    Worried About Running Out of Food in the Last Year: Never true    Ran Out of Food in the Last Year: Never true  Transportation Needs: No Transportation Needs (08/09/2022)   PRAPARE - Administrator, Civil Service (Medical): No  Lack of Transportation (Non-Medical): No  Physical Activity: Sufficiently Active (08/09/2022)   Exercise Vital Sign    Days of Exercise per Week: 4 days    Minutes of Exercise per Session: 60 min  Stress: No Stress Concern Present (08/09/2022)   [employer] of Occupational Health - Occupational Stress Questionnaire    Feeling of Stress : Not at all  Social Connections: Unknown (08/09/2022)   Social Connection and Isolation Panel [NHANES]    Frequency of Communication with Friends and Family: Twice a week    Frequency of Social Gatherings with Friends and Family: Patient declined    Attends Religious Services: More than 4 times per year    Active Member of [employer] or Organizations: Yes    Attends Engineer, structural: More than 4 times per year    Marital Status: Never married    Outpatient Medications Prior to Visit  Medication Sig Dispense Refill   ACCU-CHEK GUIDE test strip TEST TWICE DAILY 200 strip 3   Accu-Chek Softclix Lancets lancets  TEST TWICE DAILY AS DIRECTED 200 each 3   Blood Glucose Monitoring Suppl (ACCU-CHEK GUIDE) w/Device KIT USE TO CHECK BLOOD SUGAR  TWICE DAILY 1 kit 1   Cetirizine HCl (ZYRTEC PO) Take by mouth.     Fluticasone Propionate (FLONASE ALLERGY RELIEF NA) Place into the nose.     levothyroxine (SYNTHROID) 50 MCG tablet TAKE 1 TABLET BY MOUTH DAILY 90 tablet 3   simvastatin (ZOCOR) 40 MG tablet TAKE 1 TABLET BY MOUTH AT  BEDTIME 90 tablet 3   Vitamin D, Ergocalciferol, (DRISDOL) 1.25 MG (50000 UNIT) CAPS capsule Take 1 capsule (50,000 Units total) by mouth every 7 (seven) days. 12 capsule 0   metFORMIN (GLUCOPHAGE-XR) 500 MG 24 hr tablet TAKE 2 TABLETS BY MOUTH  DAILY (Patient not taking: Reported on 08/09/2022) 180 tablet 3   No facility-administered medications prior to visit.     EXAM:  BP (!) 142/78   Pulse 60   Temp 97.8 F (36.6 C) (Oral)   Ht 4' 10.75" (1.492 m)   Wt 123 lb 3.2 oz (55.9 kg)   SpO2 97%   BMI 25.10 kg/m   Body mass index is 25.1 kg/m.  GENERAL: vitals reviewed and listed above, alert, oriented, appears well hydrated and in no acute distress HEENT: atraumatic, conjunctiva  clear, no obvious abnormalities on inspection of external nose and ears MS: moves all extremities right cmc mod inc size no redness or crepitus and rom ok with pain on extension.   Grip ok and = . PSYCH: pleasant and cooperative, no obvious depression or anxiety Lab Results  Component Value Date   WBC 5.7 06/15/2022   HGB 14.9 06/15/2022   HCT 45.4 06/15/2022   PLT 221.0 06/15/2022   GLUCOSE 111 (H) 06/15/2022   CHOL 165 06/15/2022   TRIG 139.0 06/15/2022   HDL 59.40 06/15/2022   LDLDIRECT 163.6 10/23/2006   LDLCALC 78 06/15/2022   ALT 21 06/15/2022   AST 23 06/15/2022   NA 140 06/15/2022   K 4.1 06/15/2022   CL 104 06/15/2022   CREATININE 0.72 06/15/2022   BUN 15 06/15/2022   CO2 27 06/15/2022   TSH 2.54 06/15/2022   INR 1.0 ratio 11/14/2008   HGBA1C 6.8 (H) 06/15/2022   MICROALBUR  0.9 06/15/2022   BP Readings from Last 3 Encounters:  08/09/22 (!) 142/78  06/15/22 (!) 144/82  04/22/22 120/62    ASSESSMENT AND PLAN:  Discussed the following assessment and plan:  Chronic pain of right thumb - Plan: DG Finger Thumb Right  Family history of rheumatoid arthritis - mom and brother  Elevated blood pressure reading - confirm at goal at home as  in past Suspect cmc arthritis   fam hx of ra in 2 first degree relative . Currently limiting  adl  and not resolving  Xray today and referral to hand for help. Options intervnetions  . Avoiidng systemic nsaid at this time.  Check bp at home to ensure is  at goal  ( says has been below 130 in recent past) -Patient advised to return or notify health care team  if  new concerns arise.  Patient Instructions  X ray today  to r/o surprises. Will do  referral to hand specialist .  Can use otc antiinflammatories topicals  such as  Voltaren. /diclofenac gel.  In the interim  Tylenol.   up to 2000 mg per day  can take 650 at a time . Neta Mends. Aeric Burnham M.D.

## 2022-08-09 NOTE — Patient Instructions (Signed)
X ray today  to r/o surprises. Will do  referral to hand specialist .  Can use otc antiinflammatories topicals  such as  Voltaren. /diclofenac gel.  In the interim  Tylenol.   up to 2000 mg per day  can take 650 at a time .

## 2022-08-15 ENCOUNTER — Telehealth: Payer: Self-pay | Admitting: Internal Medicine

## 2022-08-15 DIAGNOSIS — M79644 Pain in right finger(s): Secondary | ICD-10-CM | POA: Diagnosis not present

## 2022-08-15 NOTE — Telephone Encounter (Signed)
error 

## 2022-08-15 NOTE — Progress Notes (Signed)
Xray cw osteoarthritis as expected .  Referral to hand surgey specialist has already been placed

## 2022-09-08 DIAGNOSIS — M79644 Pain in right finger(s): Secondary | ICD-10-CM | POA: Diagnosis not present

## 2022-09-26 ENCOUNTER — Other Ambulatory Visit: Payer: Self-pay | Admitting: Internal Medicine

## 2022-09-26 DIAGNOSIS — Z1231 Encounter for screening mammogram for malignant neoplasm of breast: Secondary | ICD-10-CM

## 2022-10-14 ENCOUNTER — Ambulatory Visit
Admission: RE | Admit: 2022-10-14 | Discharge: 2022-10-14 | Disposition: A | Discharge status: Home / Self Care | Payer: Medicare Other | Source: Ambulatory Visit | Attending: Internal Medicine | Admitting: Internal Medicine

## 2022-10-14 DIAGNOSIS — Z1231 Encounter for screening mammogram for malignant neoplasm of breast: Secondary | ICD-10-CM | POA: Diagnosis not present

## 2022-10-25 ENCOUNTER — Encounter: Payer: Self-pay | Admitting: Internal Medicine

## 2022-10-25 ENCOUNTER — Ambulatory Visit (INDEPENDENT_AMBULATORY_CARE_PROVIDER_SITE_OTHER): Payer: Medicare Other | Admitting: Internal Medicine

## 2022-10-25 ENCOUNTER — Other Ambulatory Visit: Payer: Self-pay | Admitting: Internal Medicine

## 2022-10-25 VITALS — BP 148/80 | HR 73 | Temp 98.2°F | Ht 58.5 in | Wt 120.6 lb

## 2022-10-25 DIAGNOSIS — Z7984 Long term (current) use of oral hypoglycemic drugs: Secondary | ICD-10-CM | POA: Diagnosis not present

## 2022-10-25 DIAGNOSIS — E039 Hypothyroidism, unspecified: Secondary | ICD-10-CM

## 2022-10-25 DIAGNOSIS — M79644 Pain in right finger(s): Secondary | ICD-10-CM | POA: Diagnosis not present

## 2022-10-25 DIAGNOSIS — R030 Elevated blood-pressure reading, without diagnosis of hypertension: Secondary | ICD-10-CM

## 2022-10-25 DIAGNOSIS — E538 Deficiency of other specified B group vitamins: Secondary | ICD-10-CM

## 2022-10-25 DIAGNOSIS — E118 Type 2 diabetes mellitus with unspecified complications: Secondary | ICD-10-CM

## 2022-10-25 DIAGNOSIS — G8929 Other chronic pain: Secondary | ICD-10-CM

## 2022-10-25 DIAGNOSIS — E785 Hyperlipidemia, unspecified: Secondary | ICD-10-CM

## 2022-10-25 DIAGNOSIS — Z79899 Other long term (current) drug therapy: Secondary | ICD-10-CM

## 2022-10-25 DIAGNOSIS — E559 Vitamin D deficiency, unspecified: Secondary | ICD-10-CM | POA: Diagnosis not present

## 2022-10-25 LAB — POCT GLYCOSYLATED HEMOGLOBIN (HGB A1C): Hemoglobin A1C: 6.4 % — AB (ref 4.0–5.6)

## 2022-10-25 NOTE — Patient Instructions (Addendum)
Check BP readings  goal at least  below 140/90. [facility]   is   130/80 average.   We can add low dose ARB if needed.  A1c is better  6.4  so   please stay on  once metformin 500 per day .   Plan fasting labs pre next visit will include vit d

## 2022-10-25 NOTE — Progress Notes (Signed)
Chief Complaint  Patient presents with   Medical Management of Chronic Issues    6 months follow up for DM.    HPI: Debbie Marshall 77 y.o. come in for Chronic disease management  PV in 3 24   DM taking 4 weeks of  metformin   last  month or so was off last time  HT    usually ok up to 120 125   higher in afternoon 135  some stress also  wake  up every 2 hours  fathers monitoring.  Thyroid taking med 6 days a week off Sunday  HLD no change Vit d after high dose taking  otc 2000Iu per day.  Thumb r got better with prednisone and was better at fu but now is getting some sx back  ? If other intervention Bp has been up some maybe with father care  who was in [place] for cov[place] but  at home now  ROS: See pertinent positives and negatives per HPI.  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  Dr Ezzard Standing   VITAMIN D DEFICIENCY 08/14/2009    Family History  Problem Relation Age of Onset   Stroke Mother 46   Arthritis Mother    Rheum arthritis Brother    Arthritis Brother    Gout Brother    Colon cancer Other    Esophageal cancer Neg Hx    Stomach cancer Neg Hx    Rectal cancer Neg Hx    Colon polyps Neg Hx    Breast cancer Neg Hx     Social History   Socioeconomic History   Marital status: Single    Spouse name: Not on file   Number of children: Not on file   Years of education: Not on  file   Highest education level: Doctorate  Occupational History   Occupation: PhD Professor  Tobacco Use   Smoking status: Never   Smokeless tobacco: Never  Vaping Use   Vaping status: Never Used  Substance and Sexual Activity   Alcohol use: Yes    Alcohol/week: 1.0 standard drink of alcohol    Types: 1 Glasses of wine per week    Comment: rarely   Drug use: No   Sexual activity: Not on file  Other Topics Concern   Not on file  Social History Narrative   HH of 2 Father and her    cat     PhD professor in information systems    No tobacco or alcohol exercising regularly currently   Social Determinants of Health   Financial Resource Strain: Low Risk  (08/09/2022)   Overall Financial Resource Strain (CARDIA)    Difficulty of Paying Living Expenses: Not hard at all  Food Insecurity: No Food Insecurity (08/09/2022)   Hunger Vital Sign    Worried About Running Out of  Food in the Last Year: Never true    Ran Out of Food in the Last Year: Never true  Transportation Needs: No Transportation Needs (08/09/2022)   PRAPARE - Administrator, Civil Service (Medical): No    Lack of Transportation (Non-Medical): No  Physical Activity: Sufficiently Active (08/09/2022)   Exercise Vital Sign    Days of Exercise per Week: 4 days    Minutes of Exercise per Session: 60 min  Stress: No Stress Concern Present (08/09/2022)   [employer] of Occupational Health - Occupational Stress Questionnaire    Feeling of Stress : Not at all  Social Connections: Unknown (08/09/2022)   Social Connection and Isolation Panel [NHANES]    Frequency of Communication with Friends and Family: Twice a week    Frequency of Social Gatherings with Friends and Family: Patient declined    Attends Religious Services: More than 4 times per year    Active Member of [employer] or Organizations: Yes    Attends Engineer, structural: More than 4 times per year    Marital Status: Never married    Outpatient  Medications Prior to Visit  Medication Sig Dispense Refill   ACCU-CHEK GUIDE test strip TEST TWICE DAILY 200 strip 3   Accu-Chek Softclix Lancets lancets TEST TWICE DAILY AS DIRECTED 200 each 3   Blood Glucose Monitoring Suppl (ACCU-CHEK GUIDE) w/Device KIT USE TO CHECK BLOOD SUGAR  TWICE DAILY 1 kit 1   Cetirizine HCl (ZYRTEC PO) Take by mouth.     Fluticasone Propionate (FLONASE ALLERGY RELIEF NA) Place into the nose.     levothyroxine (SYNTHROID) 50 MCG tablet TAKE 1 TABLET BY MOUTH DAILY (Patient taking differently: Pt is taking 1 tablet every day except on Sunday.) 90 tablet 3   metFORMIN (GLUCOPHAGE-XR) 500 MG 24 hr tablet TAKE 2 TABLETS BY MOUTH  DAILY 180 tablet 3   simvastatin (ZOCOR) 40 MG tablet TAKE 1 TABLET BY MOUTH AT  BEDTIME 90 tablet 3   Vitamin D, Ergocalciferol, (DRISDOL) 1.25 MG (50000 UNIT) CAPS capsule Take 1 capsule (50,000 Units total) by mouth every 7 (seven) days. 12 capsule 0   No facility-administered medications prior to visit.     EXAM:  BP (!) 148/80 (BP Location: Left Arm, Patient Position: Sitting, Cuff Size: Normal)   Pulse 73   Temp 98.2 F (36.8 C) (Oral)   Ht 4' 10.5" (1.486 m)   Wt 120 lb 9.6 oz (54.7 kg)   BMI 24.78 kg/m   Body mass index is 24.78 kg/m.  GENERAL: vitals reviewed and listed above, alert, oriented, appears well hydrated and in no acute distress HEENT: atraumatic, conjunctiva  clear, no obvious abnormalities on inspection of external nose and  masked  NECK: no obvious masses on inspection palpation  LUNGS: clear to auscultation bilaterally, no wheezes, rales or rhonchi, good air movement CV: HRRR, no clubbing cyanosis or  peripheral edema nl cap refill  MS: moves all extremities without noticeable focal  abnormality PSYCH: pleasant and cooperative, no obvious depression or anxiety Lab Results  Component Value Date   WBC 5.7 06/15/2022   HGB 14.9 06/15/2022   HCT 45.4 06/15/2022   PLT 221.0 06/15/2022   GLUCOSE 111 (H)  06/15/2022   CHOL 165 06/15/2022   TRIG 139.0 06/15/2022   HDL 59.40 06/15/2022   LDLDIRECT 163.6 10/23/2006   LDLCALC 78 06/15/2022   ALT 21 06/15/2022   AST 23 06/15/2022   NA 140 06/15/2022   K 4.1 06/15/2022  CL 104 06/15/2022   CREATININE 0.72 06/15/2022   BUN 15 06/15/2022   CO2 27 06/15/2022   TSH 2.54 06/15/2022   INR 1.0 ratio 11/14/2008   HGBA1C 6.4 (A) 10/25/2022   MICROALBUR 0.9 06/15/2022   BP Readings from Last 3 Encounters:  10/25/22 (!) 148/80  08/09/22 (!) 142/78  06/15/22 (!) 144/82    ASSESSMENT AND PLAN:  Discussed the following assessment and plan:  Controlled type 2 diabetes mellitus with complication, without long-term current use of insulin (HCC) - Plan: POC HgB A1c  Hypothyroidism, unspecified type - taking syntroid 50 6 days  per week  Medication management  Hyperlipidemia, unspecified hyperlipidemia type  Chronic pain of right thumb  Vitamin D deficiency  Elevated blood pressure reading A1c is better on metformin 500 per day  continue Will order future labs pre next visit 6 mos Disc thumb  and  not rare to have recurrance to fu with hand team Bp goal discussed and we may need to add medication but she thinks will be at goal To cont vit D otc and plan fu level at next lab work  -Patient advised to return or notify health care team  if  new concerns arise.  Patient Instructions  Check BP readings  goal at least  below 140/90. [facility]   is   130/80 average.   We can add low dose ARB if needed.  A1c is better  6.4  so   please stay on  once metformin 500 per day .   Plan fasting labs pre next visit will include vit d  Neta Mends. Serigne Kubicek M.D.

## 2022-12-01 ENCOUNTER — Other Ambulatory Visit: Payer: Self-pay | Admitting: Internal Medicine

## 2022-12-06 ENCOUNTER — Telehealth: Payer: Self-pay | Admitting: Internal Medicine

## 2022-12-06 NOTE — Telephone Encounter (Signed)
[facility] called to request a change in manufacturers for the levothyroxine levothyroxine (SYNTHROID) 50 MCG tablet  From:  [employer] to [employer]  [phone]  Ref:  [id number]

## 2022-12-07 NOTE — Telephone Encounter (Signed)
Ok with me bt document reasoning  for pharmacy teams.  And  ok to  fill as directed

## 2022-12-07 NOTE — Telephone Encounter (Signed)
Spoke to Gibson, Teacher, early years/pre. Update him of message from Dr. Fabian Sharp. Verbalized understanding.

## 2022-12-08 ENCOUNTER — Encounter: Payer: Medicare Other | Admitting: Internal Medicine

## 2023-03-15 ENCOUNTER — Ambulatory Visit: Payer: Medicare Other | Admitting: Internal Medicine

## 2023-03-15 VITALS — BP 142/84 | HR 63 | Temp 97.8°F | Ht 58.5 in | Wt 120.8 lb

## 2023-03-15 DIAGNOSIS — R519 Headache, unspecified: Secondary | ICD-10-CM

## 2023-03-15 DIAGNOSIS — Z794 Long term (current) use of insulin: Secondary | ICD-10-CM | POA: Diagnosis not present

## 2023-03-15 DIAGNOSIS — E118 Type 2 diabetes mellitus with unspecified complications: Secondary | ICD-10-CM | POA: Diagnosis not present

## 2023-03-15 DIAGNOSIS — I10 Essential (primary) hypertension: Secondary | ICD-10-CM

## 2023-03-15 DIAGNOSIS — Z23 Encounter for immunization: Secondary | ICD-10-CM | POA: Diagnosis not present

## 2023-03-15 DIAGNOSIS — Z79899 Other long term (current) drug therapy: Secondary | ICD-10-CM | POA: Diagnosis not present

## 2023-03-15 MED ORDER — VALSARTAN 160 MG PO TABS: 160.0000 mg | ORAL_TABLET | Freq: Every day | ORAL | 3 refills | Status: DC

## 2023-03-15 NOTE — Progress Notes (Signed)
Chief Complaint  Patient presents with   Elevated Blood Pressure Reading    Pt reports 4 days ago she experienced some headache at night then check BP, systolic 145/50-60. Normal BP during day time. Pt reports her BP stay over 140 systolic at night. She continues that she couldn't sleep last night. Her BP during day time is 162/82.    Headache    HPI: Debbie Marshall 77 y.o. come in for sda appt    triage told her to be seen because of increased blood pressure readings at home.  They have been running more in the 140s systolic range but recently had 160 related to headaches intermittent right upper neck lower occipital treated with Tylenol she has not been getting as much sleep recently.  Had been eating differently when she had houseguest cooking differently.  No associated symptoms with right occipital headache that comes and goes but always in the same place    ROS: See pertinent positives and negatives per HPI.  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  Dr Ezzard Standing   VITAMIN D DEFICIENCY 08/14/2009    Family History  Problem Relation Age of Onset   Stroke Mother 30   Arthritis Mother    Rheum arthritis Brother    Arthritis Brother    Gout Brother    Colon cancer Other    Esophageal cancer Neg Hx    Stomach cancer Neg Hx    Rectal cancer Neg Hx    Colon polyps Neg Hx    Breast cancer  Neg Hx     Social History   Socioeconomic History   Marital status: Single    Spouse name: Not on file   Number of children: Not on file   Years of education: Not on file   Highest education level: Doctorate  Occupational History   Occupation: PhD Professor  Tobacco Use   Smoking status: Never   Smokeless tobacco: Never  Vaping Use   Vaping status: Never Used  Substance and Sexual Activity   Alcohol use: Yes    Alcohol/week: 1.0 standard drink of alcohol    Types: 1 Glasses of wine per week    Comment: rarely   Drug use: No   Sexual activity: Not on file  Other Topics Concern   Not on file  Social History Narrative   HH of 2 Father and her    cat     PhD professor in information systems    No tobacco or alcohol exercising regularly currently   Social Determinants of Health   Financial Resource Strain: Low Risk  (03/15/2023)   Overall Financial Resource Strain (CARDIA)    Difficulty of Paying Living Expenses: Not hard at all  Food Insecurity: No Food Insecurity (03/15/2023)   Hunger Vital Sign    Worried About Running Out of  Food in the Last Year: Never true    Ran Out of Food in the Last Year: Never true  Transportation Needs: No Transportation Needs (03/15/2023)   PRAPARE - Administrator, Civil Service (Medical): No    Lack of Transportation (Non-Medical): No  Physical Activity: Sufficiently Active (03/15/2023)   Exercise Vital Sign    Days of Exercise per Week: 3 days    Minutes of Exercise per Session: 50 min  Stress: Stress Concern Present (03/15/2023)   [employer] of Occupational Health - Occupational Stress Questionnaire    Feeling of Stress : To some extent  Social Connections: Moderately Integrated (03/15/2023)   Social Connection and Isolation Panel [NHANES]    Frequency of Communication with Friends and Family: Twice a week    Frequency of Social Gatherings with Friends and Family: Once a week    Attends Religious Services: More than 4  times per year    Active Member of [employer] or Organizations: Yes    Attends Engineer, structural: More than 4 times per year    Marital Status: Never married    Outpatient Medications Prior to Visit  Medication Sig Dispense Refill   ACCU-CHEK GUIDE test strip TEST TWICE DAILY 200 strip 3   Accu-Chek Softclix Lancets lancets TEST TWICE DAILY AS DIRECTED 200 each 3   Blood Glucose Monitoring Suppl (ACCU-CHEK GUIDE) w/Device KIT USE TO CHECK BLOOD SUGAR  TWICE DAILY 1 kit 1   Cetirizine HCl (ZYRTEC PO) Take by mouth as needed.     Fluticasone Propionate (FLONASE ALLERGY RELIEF NA) Place into the nose.     levothyroxine (SYNTHROID) 50 MCG tablet TAKE 1 TABLET BY MOUTH DAILY (Patient taking differently: Pt is taking 1 tablet every day except on Sunday.) 90 tablet 3   metFORMIN (GLUCOPHAGE-XR) 500 MG 24 hr tablet TAKE 2 TABLETS BY MOUTH DAILY (Patient taking differently: Take 500 mg by mouth daily.) 180 tablet 3   simvastatin (ZOCOR) 40 MG tablet TAKE 1 TABLET BY MOUTH AT  BEDTIME 90 tablet 3   Vitamin D, Ergocalciferol, (DRISDOL) 1.25 MG (50000 UNIT) CAPS capsule Take 1 capsule (50,000 Units total) by mouth every 7 (seven) days. (Patient not taking: Reported on 03/15/2023) 12 capsule 0   No facility-administered medications prior to visit.     EXAM:  BP (!) 142/84 (BP Location: Right Arm, Patient Position: Sitting, Cuff Size: Normal)   Pulse 63   Temp 97.8 F (36.6 C) (Oral)   Ht 4' 10.5" (1.486 m)   Wt 120 lb 12.8 oz (54.8 kg)   SpO2 96%   BMI 24.82 kg/m   Body mass index is 24.82 kg/m. BP Readings from Last 3 Encounters:  03/15/23 (!) 142/84  10/25/22 (!) 148/80  08/09/22 (!) 142/78    GENERAL: vitals reviewed and listed above, alert, oriented, appears well hydrated and in no acute distress HEENT: atraumatic, conjunctiva  clear, no obvious abnormalities on inspection of external nose and ears NECK: no obvious masses on inspection palpation area where she gets the  headache appears to be at the junction right occipital neck muscle area. LUNGS: clear to auscultation bilaterally, no wheezes, rales or rhonchi, good air movement CV: HRRR, no clubbing cyanosis or  peripheral edema nl cap refill  MS: moves all extremities without noticeable focal  abnormality PSYCH: pleasant and cooperative, no obvious depression or anxiety Lab Results  Component Value Date   WBC 5.7 06/15/2022   HGB 14.9 06/15/2022   HCT 45.4 06/15/2022  PLT 221.0 06/15/2022   GLUCOSE 111 (H) 06/15/2022   CHOL 165 06/15/2022   TRIG 139.0 06/15/2022   HDL 59.40 06/15/2022   LDLDIRECT 163.6 10/23/2006   LDLCALC 78 06/15/2022   ALT 21 06/15/2022   AST 23 06/15/2022   NA 140 06/15/2022   K 4.1 06/15/2022   CL 104 06/15/2022   CREATININE 0.72 06/15/2022   BUN 15 06/15/2022   CO2 27 06/15/2022   TSH 2.54 06/15/2022   INR 1.0 ratio 11/14/2008   HGBA1C 6.4 (A) 10/25/2022   MICROALBUR 0.9 06/15/2022   BP Readings from Last 3 Encounters:  03/15/23 (!) 142/84  10/25/22 (!) 148/80  08/09/22 (!) 142/78    ASSESSMENT AND PLAN:  Discussed the following assessment and plan:  Primary hypertension  Influenza vaccine needed - Plan: Flu Vaccine Trivalent High Dose (Fluad), CANCELED: Flu vaccine trivalent PF, 6mos and older(Flulaval,Afluria,Fluarix,Fluzone)  Nonintractable episodic headache, unspecified headache type - Right neck occipital relieved by Tylenol.  No associated symptoms  Medication management  Controlled type 2 diabetes mellitus with complication, without long-term current use of insulin (HCC) Baseline blood pressures have been creeping up and then the rise to 160 may be transient but at this point which should begin antihypertensive medications. Discussed ACE receptor blockers CCB.  Because of her diabetes diagnosis we will begin valsartan 160 mg 1 p.o. daily and monitor headaches which uncertain if cervicogenic and related to blood pressure cause or effect.  Plan  follow-up virtual or in person in 2 to 3 weeks in regard to headaches and blood pressure.  At this point I had scant not  indicated with no alarm symptoms. Discussed getting back to better sleep hygiene which she is aware. -Patient advised to return or notify health care team  if  new concerns arise.  Patient Instructions  Begin  BP medication  Monitor BP readings  and Headaches . Plan fu  contact in 2-3 weeks. Virtual  or in person.  And then go from   there as follow up.   Neta Mends. Saundra Gin M.D.

## 2023-03-15 NOTE — Patient Instructions (Signed)
Begin  BP medication  Monitor BP readings  and Headaches . Plan fu  contact in 2-3 weeks. Virtual  or in person.  And then go from   there as follow up.

## 2023-03-21 ENCOUNTER — Ambulatory Visit: Payer: Medicare Other | Admitting: Internal Medicine

## 2023-03-23 DIAGNOSIS — H25813 Combined forms of age-related cataract, bilateral: Secondary | ICD-10-CM | POA: Diagnosis not present

## 2023-04-26 ENCOUNTER — Encounter: Payer: Self-pay | Admitting: Internal Medicine

## 2023-04-26 ENCOUNTER — Ambulatory Visit (INDEPENDENT_AMBULATORY_CARE_PROVIDER_SITE_OTHER): Payer: Medicare Other | Admitting: Internal Medicine

## 2023-04-26 VITALS — BP 152/80 | HR 67 | Temp 98.2°F | Ht 58.5 in | Wt 121.8 lb

## 2023-04-26 DIAGNOSIS — I10 Essential (primary) hypertension: Secondary | ICD-10-CM

## 2023-04-26 DIAGNOSIS — L509 Urticaria, unspecified: Secondary | ICD-10-CM | POA: Diagnosis not present

## 2023-04-26 DIAGNOSIS — J029 Acute pharyngitis, unspecified: Secondary | ICD-10-CM

## 2023-04-26 DIAGNOSIS — E118 Type 2 diabetes mellitus with unspecified complications: Secondary | ICD-10-CM | POA: Diagnosis not present

## 2023-04-26 LAB — POCT GLYCOSYLATED HEMOGLOBIN (HGB A1C): Hemoglobin A1C: 6.4 % — AB (ref 4.0–5.6)

## 2023-04-26 LAB — POC COVID19 BINAXNOW: SARS Coronavirus 2 Ag: NEGATIVE

## 2023-04-26 MED ORDER — AMLODIPINE BESYLATE 2.5 MG PO TABS: 2.5000 mg | ORAL_TABLET | Freq: Every day | ORAL | 1 refills | Status: DC

## 2023-04-26 NOTE — Patient Instructions (Addendum)
 Stop the valsartan   because hives can be an allergic side effect .   We want to avoid that class of medication in case  a more severe reaction.  Chest exam today is  normal.  Covid screen negative . Probably a viral respiratory infection that will run its course.    Begin  new BP medication Amlodipine  ( low dose )   let us  know readings after 4-6 weeks   Lab  update in March and then cpe . Hg A1c today is  6.4

## 2023-04-26 NOTE — Progress Notes (Signed)
 Chief Complaint  Patient presents with   Medical Management of Chronic Issues    Pt is follow up on BP.    Urticaria    Pt reports of hives on waist area, and bottom area. Unsure the cause of it. Noticed it 3-4 wks ago.    Sore Throat    Pt reports caught cold 4 days ago. Sx of sore throat, light headache, sneeze, body ache. Denied chills, fever    HPI: Debbie Marshall 78 y.o. come in for Chronic disease management  bgan valsartan  last visit and bp has been  at goal at home  ( forgot full monitor today)   but hen has develop intermittent  hives around waist line  lower abd other  resolves with allergy med but then comes back  no edema or wheezing . Hives comes and goes response to allergy  no edema as welling or resp sx with this BG in 110 130 in am  not too back  Has had a few days aoof st  nose scongestion  no cough  no fever  ROS: See pertinent positives and negatives per HPI.  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  Dr Odean Bend   VITAMIN D  DEFICIENCY 08/14/2009    Family History  Problem Relation Age of Onset   Stroke Mother 71   Arthritis Mother    Rheum arthritis Brother    Arthritis Brother    Gout Brother    Colon cancer Other    Esophageal cancer Neg Hx    Stomach cancer Neg Hx    Rectal cancer Neg Hx    Colon polyps Neg Hx    Breast cancer Neg Hx     Social History    Socioeconomic History   Marital status: Single    Spouse name: Not on file   Number of children: Not on file   Years of education: Not on file   Highest education level: Doctorate  Occupational History   Occupation: PhD Professor  Tobacco Use   Smoking status: Never   Smokeless tobacco: Never  Vaping Use   Vaping status: Never Used  Substance and Sexual Activity   Alcohol use: Yes    Alcohol/week: 1.0 standard drink of alcohol    Types: 1 Glasses of wine per week    Comment: rarely   Drug use: No   Sexual activity: Not on file  Other Topics Concern   Not on file  Social History Narrative   HH of 2 Father and her    cat     PhD professor in information systems    No tobacco or alcohol exercising regularly currently   Social Drivers of Health   Financial Resource Strain: Low Risk  (04/25/2023)   Overall Financial Resource Strain (CARDIA)    Difficulty of Paying Living Expenses: Not  hard at all  Food Insecurity: No Food Insecurity (04/25/2023)   Hunger Vital Sign    Worried About Running Out of Food in the Last Year: Never true    Ran Out of Food in the Last Year: Never true  Transportation Needs: No Transportation Needs (04/25/2023)   PRAPARE - Administrator, Civil Service (Medical): No    Lack of Transportation (Non-Medical): No  Physical Activity: Insufficiently Active (04/25/2023)   Exercise Vital Sign    Days of Exercise per Week: 2 days    Minutes of Exercise per Session: 60 min  Stress: Stress Concern Present (04/25/2023)   [employer] of Occupational Health - Occupational Stress Questionnaire    Feeling of Stress : To some extent  Social Connections: Moderately Integrated (04/25/2023)   Social Connection and Isolation Panel [NHANES]    Frequency of Communication with Friends and Family: Once a week    Frequency of Social Gatherings with Friends and Family: Twice a week    Attends Religious Services: More than 4 times per year    Active Member  of [employer] or Organizations: Yes    Attends Engineer, structural: More than 4 times per year    Marital Status: Never married    Outpatient Medications Prior to Visit  Medication Sig Dispense Refill   ACCU-CHEK GUIDE test strip TEST TWICE DAILY 200 strip 3   Accu-Chek Softclix Lancets lancets TEST TWICE DAILY AS DIRECTED 200 each 3   Blood Glucose Monitoring Suppl (ACCU-CHEK GUIDE) w/Device KIT USE TO CHECK BLOOD SUGAR  TWICE DAILY 1 kit 1   Cetirizine HCl (ZYRTEC PO) Take by mouth as needed.     Fluticasone  Propionate (FLONASE  ALLERGY RELIEF NA) Place into the nose.     levothyroxine  (SYNTHROID ) 50 MCG tablet TAKE 1 TABLET BY MOUTH DAILY (Patient taking differently: Pt is taking 1 tablet every day except on Sunday.) 90 tablet 3   metFORMIN  (GLUCOPHAGE -XR) 500 MG 24 hr tablet TAKE 2 TABLETS BY MOUTH DAILY (Patient taking differently: Take 500 mg by mouth daily.) 180 tablet 3   simvastatin  (ZOCOR ) 40 MG tablet TAKE 1 TABLET BY MOUTH AT  BEDTIME 90 tablet 3   valsartan  (DIOVAN ) 160 MG tablet Take 1 tablet (160 mg total) by mouth daily. 30 tablet 3   No facility-administered medications prior to visit.     EXAM:  BP (!) 152/80 (BP Location: Left Arm, Patient Position: Sitting, Cuff Size: Normal)   Pulse 67   Temp 98.2 F (36.8 C) (Oral)   Ht 4' 10.5" (1.486 m)   Wt 121 lb 12.8 oz (55.2 kg)   SpO2 97%   BMI 25.02 kg/m   Body mass index is 25.02 kg/m.  GENERAL: vitals reviewed and listed above, alert, oriented, appears well hydrated and in no acute distress HEENT: atraumatic, conjunctiva  clear, no obvious abnormalities on inspection of external nose and ears OP : no lesion edema or exudate   NECK: no obvious masses on inspection palpation  LUNGS: clear to auscultation bilaterally, no wheezes, rales or rhonchi, good air movement CV: HRRR, no clubbing cyanosis or  peripheral edema nl cap refill  MS: moves all extremities without noticeable focal  abnormality PSYCH: pleasant  and cooperative, no obvious depression or anxiety Lab Results  Component Value Date   WBC 5.7 06/15/2022   HGB 14.9 06/15/2022   HCT 45.4 06/15/2022   PLT 221.0 06/15/2022   GLUCOSE 111 (H) 06/15/2022   CHOL 165 06/15/2022   TRIG  139.0 06/15/2022   HDL 59.40 06/15/2022   LDLDIRECT 163.6 10/23/2006   LDLCALC 78 06/15/2022   ALT 21 06/15/2022   AST 23 06/15/2022   NA 140 06/15/2022   K 4.1 06/15/2022   CL 104 06/15/2022   CREATININE 0.72 06/15/2022   BUN 15 06/15/2022   CO2 27 06/15/2022   TSH 2.54 06/15/2022   INR 1.0 ratio 11/14/2008   HGBA1C 6.4 (A) 04/26/2023   MICROALBUR 0.9 06/15/2022   BP Readings from Last 3 Encounters:  04/26/23 (!) 152/80  03/15/23 (!) 142/84  10/25/22 (!) 148/80    ASSESSMENT AND PLAN:  Discussed the following assessment and plan:  Primary hypertension  Controlled type 2 diabetes mellitus with complication, without long-term current use of insulin (HCC) - Plan: POC HgB A1c  Hives  Sore throat - prob viiral self limimted covid neg screen - Plan: POC COVID-19 Dm adequate control Hives concern that could be valsartan  arb and risk or worsening  so though it was helpful will dc and change different  med category  Amlodipine  2.5 every day  Go off arb and begin new med after off all risk  can add allergy med as needed  Future orders    in place  -Patient advised to return or notify health care team  if  new concerns arise.  Patient Instructions  Stop the valsartan   because hives can be an allergic side effect .   We want to avoid that class of medication in case  a more severe reaction.  Chest exam today is  normal.  Covid screen negative . Probably a viral respiratory infection that will run its course.    Begin  new BP medication Amlodipine  ( low dose )   let us  know readings after 4-6 weeks   Lab  update in March and then cpe . Hg A1c today is  6.4   Maurion Walkowiak K. Shatia Sindoni M.D.

## 2023-05-05 ENCOUNTER — Ambulatory Visit (INDEPENDENT_AMBULATORY_CARE_PROVIDER_SITE_OTHER): Payer: Medicare Other

## 2023-05-05 VITALS — BP 122/62 | HR 60 | Temp 98.5°F | Ht 58.5 in | Wt 119.9 lb

## 2023-05-05 DIAGNOSIS — Z0000 Encounter for general adult medical examination without abnormal findings: Secondary | ICD-10-CM

## 2023-05-05 NOTE — Progress Notes (Signed)
Subjective:   Debbie Marshall is a 78 y.o. female who presents for Medicare Annual (Subsequent) preventive examination.  Visit Complete: In person  Patient Medicare AWV questionnaire was completed by the patient on 05/02/23; I have confirmed that all information answered by patient is correct and no changes since this date.  Cardiac Risk Factors include: diabetes mellitus     Objective:    Today's Vitals   05/05/23 0928  BP: 122/62  Pulse: 60  Temp: 98.5 F (36.9 C)  TempSrc: Oral  SpO2: 97%  Weight: 119 lb 14.4 oz (54.4 kg)  Height: 4' 10.5" (1.486 m)   Body mass index is 24.63 kg/m.     05/05/2023    9:51 AM 04/22/2022    1:50 PM 04/21/2021    2:02 PM 03/17/2021    8:08 AM 04/30/2020    3:44 PM 12/30/2014    8:48 AM 10/23/2013   10:41 AM  Advanced Directives  Does Patient Have a Medical Advance Directive? No No No No No No Patient does not have advance directive  Would patient like information on creating a medical advance directive? No - Patient declined No - Patient declined No - Patient declined No - Patient declined No - Patient declined      Current Medications (verified) Outpatient Encounter Medications as of 05/05/2023  Medication Sig   ACCU-CHEK GUIDE test strip TEST TWICE DAILY   Accu-Chek Softclix Lancets lancets TEST TWICE DAILY AS DIRECTED   amLODipine (NORVASC) 2.5 MG tablet Take 1 tablet (2.5 mg total) by mouth daily. For high blood pressure   Blood Glucose Monitoring Suppl (ACCU-CHEK GUIDE) w/Device KIT USE TO CHECK BLOOD SUGAR  TWICE DAILY   Cetirizine HCl (ZYRTEC PO) Take by mouth as needed.   Fluticasone Propionate (FLONASE ALLERGY RELIEF NA) Place into the nose.   levothyroxine (SYNTHROID) 50 MCG tablet TAKE 1 TABLET BY MOUTH DAILY (Patient taking differently: Pt is taking 1 tablet every day except on Sunday.)   metFORMIN (GLUCOPHAGE-XR) 500 MG 24 hr tablet TAKE 2 TABLETS BY MOUTH DAILY (Patient taking differently: Take 500 mg by mouth daily.)    simvastatin (ZOCOR) 40 MG tablet TAKE 1 TABLET BY MOUTH AT  BEDTIME   No facility-administered encounter medications on file as of 05/05/2023.    Allergies (verified) Valsartan   History: 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  Dr Ezzard Standing   VITAMIN D DEFICIENCY 08/14/2009   Past Surgical History:  Procedure Laterality Date   COLONOSCOPY     NASAL POLYP SURGERY  04/16/2020   POLYPECTOMY     SALIVARY GLAND SURGERY  1996   Tumor removed on back of tongue and salivary gland   Family History  Problem Relation Age of Onset   Stroke Mother 46   Arthritis Mother    Rheum arthritis Brother    Arthritis  Brother    Gout Brother    Colon cancer Other    Esophageal cancer Neg Hx    Stomach cancer Neg Hx    Rectal cancer Neg Hx    Colon polyps Neg Hx    Breast cancer Neg Hx    Social History   Socioeconomic History   Marital status: Single    Spouse name: Not on file   Number of children: Not on file   Years of education: Not on file   Highest education level: Doctorate  Occupational History   Occupation: PhD Professor  Tobacco Use   Smoking status: Never   Smokeless tobacco: Never  Vaping Use   Vaping status: Never Used  Substance and Sexual Activity   Alcohol use: Yes    Alcohol/week: 1.0 standard drink of alcohol    Types: 1 Glasses of wine per week    Comment: rarely    Drug use: No   Sexual activity: Not on file  Other Topics Concern   Not on file  Social History Narrative   HH of 2 Father and her    cat     PhD professor in information systems    No tobacco or alcohol exercising regularly currently   Social Drivers of Health   Financial Resource Strain: Low Risk  (05/05/2023)   Overall Financial Resource Strain (CARDIA)    Difficulty of Paying Living Expenses: Not hard at all  Food Insecurity: No Food Insecurity (05/05/2023)   Hunger Vital Sign    Worried About Running Out of Food in the Last Year: Never true    Ran Out of Food in the Last Year: Never true  Transportation Needs: No Transportation Needs (05/05/2023)   PRAPARE - Administrator, Civil Service (Medical): No    Lack of Transportation (Non-Medical): No  Physical Activity: Insufficiently Active (05/05/2023)   Exercise Vital Sign    Days of Exercise per Week: 2 days    Minutes of Exercise per Session: 60 min  Stress: No Stress Concern Present (05/05/2023)   [employer] of Occupational Health - Occupational Stress Questionnaire    Feeling of Stress : Not at all  Recent Concern: Stress - Stress Concern Present (04/25/2023)   [employer] of Occupational Health - Occupational Stress Questionnaire    Feeling of Stress : To some extent  Social Connections: Moderately Integrated (05/05/2023)   Social Connection and Isolation Panel [NHANES]    Frequency of Communication with Friends and Family: More than three times a week    Frequency of Social Gatherings with Friends and Family: More than three times a week    Attends Religious Services: More than 4 times per year    Active Member of [employer] or Organizations: Yes    Attends Engineer, structural: More than 4 times per year    Marital Status: Never married    Tobacco Counseling Counseling given: Not Answered   Clinical Intake:  Pre-visit preparation completed: Yes  Pain : No/denies pain     BMI -  recorded: 24.63 Nutritional Status: BMI of 19-24  Normal Nutritional Risks: None Diabetes: Yes CBG done?: Yes (CBG 102 Per patient) CBG resulted in Enter/ Edit results?: Yes Did pt. bring in CBG monitor from home?: No  How often do you need to have someone help you when you read instructions, pamphlets, or other written materials from your doctor or pharmacy?: 1 - Never  Interpreter Needed?: No  Information entered by :: Theresa Mulligan LPN  Activities of Daily Living    05/05/2023    9:48 AM 05/02/2023    2:06 PM  In your present state of health, do you have any difficulty performing the following activities:  Hearing? 0 0  Vision? 0 0  Difficulty concentrating or making decisions? 0 0  Walking or climbing stairs? 0 0  Dressing or bathing? 0 0  Doing errands, shopping? 0 0  Preparing Food and eating ? N N  Using the Toilet? N N  In the past six months, have you accidently leaked urine? N N  Do you have problems with loss of bowel control? N N  Managing your Medications? N N  Managing your Finances? N N  Housekeeping or managing your Housekeeping? N N    Patient Care Team: Panosh, Neta Mends, MD as PCP - General Lutricia Feil, Cvp [facility] as Pharmacist (Pharmacist)  Indicate any recent Medical Services you may have received from other than [facility] providers in the past year (date may be approximate).     Assessment:   This is a routine wellness examination for Misa.  Hearing/Vision screen Hearing Screening - Comments:: Denies hearing difficulties   Vision Screening - Comments:: Wears rx glasses - up to date with routine eye exams with  [facility]   Goals Addressed               This Visit's Progress     Lose weight (pt-stated)         Depression Screen    05/05/2023    9:47 AM 04/26/2023    2:15 PM 04/22/2022    1:35 PM 04/21/2021    1:55 PM 03/08/2021   10:13 AM 04/30/2020    3:47 PM 02/26/2020   11:42 AM  PHQ 2/9 Scores  PHQ - 2 Score 0 2 0 0 2 0 0   PHQ- 9 Score 0 4  0 4 0     Fall Risk    05/05/2023    9:50 AM 05/02/2023    2:06 PM 04/26/2023    2:15 PM 08/09/2022    1:46 PM 08/09/2022   12:10 PM  Fall Risk   Falls in the past year? 0 0 0 0 0  Number falls in past yr: 0 0 0 0   Injury with Fall? 0 0 0 0   Risk for fall due to : No Fall Risks  No Fall Risks No Fall Risks   Follow up Falls prevention discussed  Falls evaluation completed Falls evaluation completed     MEDICARE RISK AT HOME: Medicare Risk at Home Any stairs in or around the home?: No If so, are there any without handrails?: No Home free of loose throw rugs in walkways, pet beds, electrical cords, etc?: Yes Adequate lighting in your home to reduce risk of falls?: Yes Life alert?: No Use of a cane, walker or w/c?: No Grab bars in the bathroom?: Yes Shower chair or bench in shower?: Yes Elevated toilet seat or a handicapped toilet?: No  TIMED UP AND GO:  Was the test performed?  Yes  Length of time to ambulate 10 feet: 10 sec Gait steady and fast without use of assistive device    Cognitive Function:        05/05/2023    9:51 AM 04/22/2022    1:51 PM 04/21/2021    1:59 PM  6CIT Screen  What Year? 0 points 0 points 0 points  What month? 0 points 0 points 0 points  What  time? 0 points 0 points 0 points  Count back from 20 0 points 0 points 0 points  Months in reverse 0 points 0 points 0 points  Repeat phrase 0 points 0 points 0 points  Total Score 0 points 0 points 0 points    Immunizations Immunization History  Administered Date(s) Administered   Fluad Quad(high Dose 65+) 01/25/2019, 02/26/2020, 03/01/2021, 04/22/2022   Fluad Trivalent(High Dose 65+) 03/15/2023   Influenza Split 04/24/2012   Influenza Whole 02/19/2010   Influenza, High Dose Seasonal PF 04/02/2015, 05/27/2016, 12/06/2016, 12/21/2017   Influenza,inj,Quad PF,6+ Mos 04/18/2013, 02/27/2014   Influenza,inj,quad, With Preservative 01/07/2019   PFIZER(Purple Top)SARS-COV-2 Vaccination  06/20/2019, 07/10/2019, 03/11/2020   PPD Test 07/02/2018, 10/18/2021   Pneumococcal Conjugate-13 11/03/2014   Pneumococcal Polysaccharide-23 10/24/2012   Tdap 06/12/2013   Zoster, Live 06/12/2013   Zoster, Unspecified 07/11/2022    TDAP status: Up to date  Flu Vaccine status: Up to date  Pneumococcal vaccine status: Up to date  Covid-19 vaccine status: Declined, Education has been provided regarding the importance of this vaccine but patient still declined. Advised may receive this vaccine at local pharmacy or Health Dept.or vaccine clinic. Aware to provide a copy of the vaccination record if obtained from local pharmacy or Health Dept. Verbalized acceptance and understanding.  Qualifies for Shingles Vaccine? Yes   Zostavax completed Yes   Screening Tests Health Maintenance  Topic Date Due   Zoster Vaccines- Shingrix (1 of 2) 06/13/1964   FOOT EXAM  03/08/2022   COVID-19 Vaccine (4 - 2024-25 season) 12/11/2022   DTaP/Tdap/Td (2 - Td or Tdap) 06/13/2023   Diabetic kidney evaluation - eGFR measurement  06/15/2023   Diabetic kidney evaluation - Urine ACR  06/15/2023   Colonoscopy  09/23/2023   HEMOGLOBIN A1C  10/24/2023   OPHTHALMOLOGY EXAM  03/11/2024   Medicare Annual Wellness (AWV)  05/04/2024   Pneumonia Vaccine 36+ Years old  Completed   INFLUENZA VACCINE  Completed   DEXA SCAN  Completed   Hepatitis C Screening  Completed   HPV VACCINES  Aged Out    Health Maintenance  Health Maintenance Due  Topic Date Due   Zoster Vaccines- Shingrix (1 of 2) 06[birth year]13/1964   FOOT EXAM  03/08/2022   COVID-19 Vaccine (4 - 2024-25 season) 12/11/2022    Colorectal cancer screening: Type of screening: Colonoscopy. Completed 09/22/20. Repeat every 3 years    Bone Density status: Completed 09/09/20. Results reflect: Bone density results: OSTEOPENIA. Repeat every   years.     Additional Screening:  Hepatitis C Screening: does qualify; Completed 09/16/08  Vision Screening:  Recommended annual ophthalmology exams for early detection of glaucoma and other disorders of the eye. Is the patient up to date with their annual eye exam?  Yes  Who is the provider or what is the name of the office in which the patient attends annual eye exams? [facility] If pt is not established with a provider, would they like to be referred to a provider to establish care? No .   Dental Screening: Recommended annual dental exams for proper oral hygiene  Diabetic Foot Exam: Diabetic Foot Exam: Overdue, Pt has been advised about the importance in completing this exam. Pt is scheduled for diabetic foot exam on Defrred.  Community Resource Referral / Chronic Care Management:  CRR required this visit?  No   CCM required this visit?  No     Plan:     I have personally reviewed and noted the following in  the patient's chart:   Medical and social history Use of alcohol, tobacco or illicit drugs  Current medications and supplements including opioid prescriptions. Patient is not currently taking opioid prescriptions. Functional ability and status Nutritional status Physical activity Advanced directives List of other physicians Hospitalizations, surgeries, and ER visits in previous 12 months Vitals Screenings to include cognitive, depression, and falls Referrals and appointments  In addition, I have reviewed and discussed with patient certain preventive protocols, quality metrics, and best practice recommendations. A written personalized care plan for preventive services as well as general preventive health recommendations were provided to patient.     Tillie Rung, LPN   1/61/0960   After Visit Summary: (In Person-Printed) AVS printed and given to the patient  Nurse Notes: None

## 2023-05-05 NOTE — Patient Instructions (Addendum)
Debbie Marshall , Thank you for taking time to come for your Medicare Wellness Visit. I appreciate your ongoing commitment to your health goals. Please review the following plan we discussed and let me know if I can assist you in the future.   Referrals/Orders/Follow-Ups/Clinician Recommendations:   This is a list of the screening recommended for you and due dates:  Health Maintenance  Topic Date Due   Zoster (Shingles) Vaccine (1 of 2) 06[birth year]13/1966   Complete foot exam   03/08/2022   COVID-19 Vaccine (4 - 2024-25 season) 12/11/2022   DTaP/Tdap/Td vaccine (2 - Td or Tdap) 06/13/2023   Yearly kidney function blood test for diabetes  06/15/2023   Yearly kidney health urinalysis for diabetes  06/15/2023   Colon Cancer Screening  09/23/2023   Hemoglobin A1C  10/24/2023   Eye exam for diabetics  03/11/2024   Medicare Annual Wellness Visit  05/04/2024   Pneumonia Vaccine  Completed   Flu Shot  Completed   DEXA scan (bone density measurement)  Completed   Hepatitis C Screening  Completed   HPV Vaccine  Aged Out    Advanced directives: (Declined) Advance directive discussed with you today. Even though you declined this today, please call our office should you change your mind, and we can give you the proper paperwork for you to fill out.  Next Medicare Annual Wellness Visit scheduled for next year: Yes

## 2023-06-10 ENCOUNTER — Other Ambulatory Visit: Payer: Self-pay | Admitting: Internal Medicine

## 2023-06-12 ENCOUNTER — Telehealth: Payer: Self-pay

## 2023-06-12 NOTE — Telephone Encounter (Signed)
 Pharmacy send a Rx request on Valsartan  that was d/c by provider.   Contacted pt to follow up on her BP medication.   Pt reports they had stop the Valsartan  due to hives reaction possibly from the medication.   Pt updates after stop taking the medication, hives sx still continues. Pt states it is not due to her BP med. Pt states she has been taking zyrtec for the sx. Sx has "weaken". Pt states initially was taking it daily, then every other day. Now, pt states she is taking it every 3 days. Pt states she is monitoring her sx.   As for BP, pt states she is not taking new BP and not on any BP medication.   Pt states she check her BP daily- morning and evening. Her systolic range is 115-125 and diastolic range is 60-70.   Please review and advise.

## 2023-06-13 NOTE — Telephone Encounter (Signed)
 Spoke to pt. Relay message to pt.  Pt verbalized understanding and states she will be seeing Dr. Fabian Sharp on 06/21/2023.   No further action is needed.

## 2023-06-14 ENCOUNTER — Other Ambulatory Visit: Payer: Medicare Other

## 2023-06-16 ENCOUNTER — Other Ambulatory Visit (INDEPENDENT_AMBULATORY_CARE_PROVIDER_SITE_OTHER): Payer: Medicare Other

## 2023-06-16 DIAGNOSIS — Z79899 Other long term (current) drug therapy: Secondary | ICD-10-CM | POA: Diagnosis not present

## 2023-06-16 DIAGNOSIS — E039 Hypothyroidism, unspecified: Secondary | ICD-10-CM

## 2023-06-16 DIAGNOSIS — E785 Hyperlipidemia, unspecified: Secondary | ICD-10-CM | POA: Diagnosis not present

## 2023-06-16 DIAGNOSIS — E538 Deficiency of other specified B group vitamins: Secondary | ICD-10-CM

## 2023-06-16 DIAGNOSIS — E118 Type 2 diabetes mellitus with unspecified complications: Secondary | ICD-10-CM

## 2023-06-16 DIAGNOSIS — R030 Elevated blood-pressure reading, without diagnosis of hypertension: Secondary | ICD-10-CM

## 2023-06-16 DIAGNOSIS — E559 Vitamin D deficiency, unspecified: Secondary | ICD-10-CM

## 2023-06-16 LAB — CBC WITH DIFFERENTIAL/PLATELET
Basophils Absolute: 0 10*3/uL (ref 0.0–0.1)
Basophils Relative: 0.9 % (ref 0.0–3.0)
Eosinophils Absolute: 0.2 10*3/uL (ref 0.0–0.7)
Eosinophils Relative: 4.2 % (ref 0.0–5.0)
HCT: 44.5 % (ref 36.0–46.0)
Hemoglobin: 14.7 g/dL (ref 12.0–15.0)
Lymphocytes Relative: 43.2 % (ref 12.0–46.0)
Lymphs Abs: 2.3 10*3/uL (ref 0.7–4.0)
MCHC: 33 g/dL (ref 30.0–36.0)
MCV: 93.3 fl (ref 78.0–100.0)
Monocytes Absolute: 0.3 10*3/uL (ref 0.1–1.0)
Monocytes Relative: 6 % (ref 3.0–12.0)
Neutro Abs: 2.4 10*3/uL (ref 1.4–7.7)
Neutrophils Relative %: 45.7 % (ref 43.0–77.0)
Platelets: 221 10*3/uL (ref 150.0–400.0)
RBC: 4.77 Mil/uL (ref 3.87–5.11)
RDW: 13.2 % (ref 11.5–15.5)
WBC: 5.3 10*3/uL (ref 4.0–10.5)

## 2023-06-16 LAB — MICROALBUMIN / CREATININE URINE RATIO
Creatinine,U: 52 mg/dL
Microalb Creat Ratio: 13.5 mg/g (ref 0.0–30.0)
Microalb, Ur: <0.7 mg/dL (ref 0.0–1.9)

## 2023-06-16 LAB — BASIC METABOLIC PANEL
BUN: 21 mg/dL (ref 6–23)
CO2: 27 meq/L (ref 19–32)
Calcium: 9.8 mg/dL (ref 8.4–10.5)
Chloride: 104 meq/L (ref 96–112)
Creatinine, Ser: 0.76 mg/dL (ref 0.40–1.20)
GFR: 76.34 mL/min (ref >60.00)
Glucose, Bld: 124 mg/dL — ABNORMAL HIGH (ref 70–99)
Potassium: 4.1 meq/L (ref 3.5–5.1)
Sodium: 142 meq/L (ref 135–145)

## 2023-06-16 LAB — HEPATIC FUNCTION PANEL
ALT: 15 U/L (ref 0–35)
AST: 19 U/L (ref 0–37)
Albumin: 4.4 g/dL (ref 3.5–5.2)
Alkaline Phosphatase: 59 U/L (ref 39–117)
Bilirubin, Direct: 0.1 mg/dL (ref 0.0–0.3)
Total Bilirubin: 0.4 mg/dL (ref 0.2–1.2)
Total Protein: 7 g/dL (ref 6.0–8.3)

## 2023-06-16 LAB — LIPID PANEL
Cholesterol: 150 mg/dL (ref 0–200)
HDL: 58.9 mg/dL (ref >39.00)
LDL Cholesterol: 72 mg/dL (ref 0–99)
NonHDL: 91.57
Total CHOL/HDL Ratio: 3
Triglycerides: 97 mg/dL (ref 0.0–149.0)
VLDL: 19.4 mg/dL (ref 0.0–40.0)

## 2023-06-16 LAB — HEMOGLOBIN A1C: Hgb A1c MFr Bld: 6.6 % — ABNORMAL HIGH (ref 4.6–6.5)

## 2023-06-16 LAB — VITAMIN D 25 HYDROXY (VIT D DEFICIENCY, FRACTURES): VITD: 19.12 ng/mL — ABNORMAL LOW (ref 30.00–100.00)

## 2023-06-16 LAB — VITAMIN B12: Vitamin B-12: 285 pg/mL (ref 211–911)

## 2023-06-16 LAB — TSH: TSH: 6.53 u[IU]/mL — ABNORMAL HIGH (ref 0.35–5.50)

## 2023-06-19 ENCOUNTER — Encounter: Payer: Self-pay | Admitting: Internal Medicine

## 2023-06-19 NOTE — Progress Notes (Signed)
 A1c slightly up thyroid off, low Vit D    rest in normal  will review at upcoming visit

## 2023-06-20 NOTE — Progress Notes (Signed)
 Chief Complaint  Patient presents with   Annual Exam    Pt is her for cpe. Pt reports she is not taking her BP. Pt states BP reading at home good and still has ongoing hives. Taking zyrtec as needed.     HPI: Patient  Debbie Marshall  78 y.o. comes in today for Preventive Health Care visit  And Chronic disease management   Dm:  no new neuropathy vision sx  Sometimes   apathy .  ? If depression.  For days at a time and then ok and has mor energy  Still getting  ithcy rash off and on felt hives?  Comes and goes  Off all vit d had been on rx  Taking thyroid med  only about 30 pre other  but regular  Health Maintenance  Topic Date Due   FOOT EXAM  03/08/2022   Zoster Vaccines- Shingrix (2 of 2) 09/05/2022   Colonoscopy  09/23/2023   COVID-19 Vaccine (4 - 2024-25 season) 07/07/2023 (Originally 12/11/2022)   DTaP/Tdap/Td (2 - Td or Tdap) 03/22/2024 (Originally 06/13/2023)   HEMOGLOBIN A1C  12/17/2023   OPHTHALMOLOGY EXAM  03/11/2024   Medicare Annual Wellness (AWV)  05/04/2024   Diabetic kidney evaluation - eGFR measurement  06/15/2024   Diabetic kidney evaluation - Urine ACR  06/15/2024   Pneumonia Vaccine 53+ Years old  Completed   INFLUENZA VACCINE  Completed   DEXA SCAN  Completed   Hepatitis C Screening  Completed   HPV VACCINES  Aged Out   Health Maintenance Review LIFESTYLE:  Exercise:  active  Tobacco/ETS:n Alcohol: n Sugar beverages: Sleep:can be off  Drug use: no HH of 2 takes care of elederly father in 80s  Work:retired professor    ROS:  hives off and on repsond to meds  GEN/ HEENT: No fever, significant weight changes sweats headaches vision problems hearing changes, CV/ PULM; No chest pain shortness of breath cough, syncope,edema  change in exercise tolerance. GI /GU: No adominal pain, vomiting, change in bowel habits. No blood in the stool. No significant GU symptoms. SKIN/HEME: ,no suspicious lesions or bleeding. No lymphadenopathy, nodules, masses.  NEURO/  PSYCH:  No neurologic signs such as weakness numbness.  IMM/ Allergy: No unusual infections.  Marland Kitchen   REST of 12 system review negative except as per HPI   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  Dr Ezzard Standing   VITAMIN D DEFICIENCY 08/14/2009    Past Surgical History:  Procedure Laterality Date   COLONOSCOPY     NASAL POLYP SURGERY  04/16/2020   POLYPECTOMY     SALIVARY GLAND SURGERY  1996   Tumor removed on back of tongue and salivary gland    Family History  Problem Relation Age of Onset   Stroke Mother 6   Arthritis Mother    Rheum  arthritis Brother    Arthritis Brother    Gout Brother    Colon cancer Other    Esophageal cancer Neg Hx    Stomach cancer Neg Hx    Rectal cancer Neg Hx    Colon polyps Neg Hx    Breast cancer Neg Hx     Social History   Socioeconomic History   Marital status: Single    Spouse name: Not on file   Number of children: Not on file   Years of education: Not on file   Highest education level: Doctorate  Occupational History   Occupation: PhD Professor  Tobacco Use   Smoking status: Never   Smokeless tobacco: Never  Vaping Use   Vaping status: Never Used  Substance and Sexual Activity   Alcohol use: Yes    Alcohol/week: 1.0 standard drink of alcohol    Types: 1 Glasses of wine per week    Comment: rarely   Drug use: No   Sexual activity: Not  on file  Other Topics Concern   Not on file  Social History Narrative   HH of 2 Father and her    cat     PhD professor in information systems    No tobacco or alcohol exercising regularly currently   Social Drivers of Health   Financial Resource Strain: Low Risk  (05/05/2023)   Overall Financial Resource Strain (CARDIA)    Difficulty of Paying Living Expenses: Not hard at all  Food Insecurity: No Food Insecurity (05/05/2023)   Hunger Vital Sign    Worried About Running Out of Food in the Last Year: Never true    Ran Out of Food in the Last Year: Never true  Transportation Needs: No Transportation Needs (05/05/2023)   PRAPARE - Administrator, Civil Service (Medical): No    Lack of Transportation (Non-Medical): No  Physical Activity: Insufficiently Active (05/05/2023)   Exercise Vital Sign    Days of Exercise per Week: 2 days    Minutes of Exercise per Session: 60 min  Stress: No Stress Concern Present (05/05/2023)   [employer] of Occupational Health - Occupational Stress Questionnaire    Feeling of Stress : Not at all  Recent Concern: Stress - Stress Concern Present (04/25/2023)   [employer] of Occupational Health - Occupational Stress Questionnaire    Feeling of Stress : To some extent  Social Connections: Moderately Integrated (05/05/2023)   Social Connection and Isolation Panel [NHANES]    Frequency of Communication with Friends and Family: More than three times a week    Frequency of Social Gatherings with Friends and Family: More than three times a week    Attends Religious Services: More than 4 times per year    Active Member of [employer] or Organizations: Yes    Attends Engineer, structural: More than 4 times per year    Marital Status: Never married    Outpatient Medications Prior to Visit  Medication Sig Dispense Refill   ACCU-CHEK GUIDE test strip TEST TWICE DAILY 200 strip 3   Accu-Chek Softclix Lancets lancets TEST TWICE DAILY AS  DIRECTED 200 each 3   Blood Glucose Monitoring Suppl (ACCU-CHEK GUIDE) w/Device KIT USE TO CHECK BLOOD SUGAR  TWICE DAILY 1 kit 1   Cetirizine HCl (ZYRTEC PO) Take by mouth as needed.     Cholecalciferol (VITAMIN D3) 50 MCG (2000 UT) capsule Take 1 capsule (2,000 Units total) by mouth daily.     levothyroxine (SYNTHROID) 50  MCG tablet TAKE 1 TABLET BY MOUTH DAILY (Patient taking differently: Pt is taking 1 tablet every day except on Sunday.) 90 tablet 3   metFORMIN (GLUCOPHAGE-XR) 500 MG 24 hr tablet TAKE 2 TABLETS BY MOUTH DAILY (Patient taking differently: Take 500 mg by mouth daily.) 180 tablet 3   simvastatin (ZOCOR) 40 MG tablet TAKE 1 TABLET BY MOUTH AT  BEDTIME 90 tablet 3   amLODipine (NORVASC) 2.5 MG tablet Take 1 tablet (2.5 mg total) by mouth daily. For high blood pressure (Patient not taking: Reported on 06/21/2023) 90 tablet 1   Fluticasone Propionate (FLONASE ALLERGY RELIEF NA) Place into the nose. (Patient not taking: Reported on 06/21/2023)     No facility-administered medications prior to visit.     EXAM:  BP 130/80 (BP Location: Right Arm, Patient Position: Sitting, Cuff Size: Normal)   Pulse 73   Temp 97.9 F (36.6 C) (Oral)   Ht 4' 10.15" (1.477 m)   Wt 122 lb 6.4 oz (55.5 kg)   SpO2 97%   BMI 25.45 kg/m   Body mass index is 25.45 kg/m. Wt Readings from Last 3 Encounters:  06/21/23 122 lb 6.4 oz (55.5 kg)  05/05/23 119 lb 14.4 oz (54.4 kg)  04/26/23 121 lb 12.8 oz (55.2 kg)    Physical Exam: Vital signs reviewed ZOX:WRUE is a well-developed well-nourished alert cooperative    who appearsr stated age in no acute distress.  HEENT: normocephalic atraumatic , Eyes: PERRL EOM's full, conjunctiva clear, Nares: paten,t no deformity discharge or tenderness., Ears: no deformity EAC's clear TMs with normal landmarks. Mouth: clear OP, no lesions, edema.  Moist mucous membranes. Dentition in adequate repair. NECK: supple without masses, thyromegaly or bruits. CHEST/PULM:   Clear to auscultation and percussion breath sounds equal no wheeze , rales or rhonchi. Breast: normal by inspection . No dimpling, discharge, masses, tenderness or discharge . CV: PMI is nondisplaced, S1 S2 no gallops, murmurs, rubs. Peripheral pulses are full without delay.No JVD .  ABDOMEN: Bowel sounds normal nontender  No guard or rebound, no hepato splenomegal no CVA tenderness.  Extremtities:  No clubbing cyanosis or edema, no acute joint swelling or redness no focal atrophy NEURO:  Oriented x3, cranial nerves 3-12 appear to be intact, no obvious focal weakness,gait within normal limits no abnormal reflexes or asymmetrical SKIN: No acute rashes normal turgor, color, no bruising or petechiae. PSYCH: Oriented, good eye contact, no obvious depression anxiety, cognition and judgment appear normal. LN: no cervical axillary  adenopathy  Lab Results  Component Value Date   WBC 5.3 06/16/2023   HGB 14.7 06/16/2023   HCT 44.5 06/16/2023   PLT 221.0 06/16/2023   GLUCOSE 124 (H) 06/16/2023   CHOL 150 06/16/2023   TRIG 97.0 06/16/2023   HDL 58.90 06/16/2023   LDLDIRECT 163.6 10/23/2006   LDLCALC 72 06/16/2023   ALT 15 06/16/2023   AST 19 06/16/2023   NA 142 06/16/2023   K 4.1 06/16/2023   CL 104 06/16/2023   CREATININE 0.76 06/16/2023   BUN 21 06/16/2023   CO2 27 06/16/2023   TSH 6.53 (H) 06/16/2023   INR 1.0 ratio 11/14/2008   HGBA1C 6.6 (H) 06/16/2023   MICROALBUR <0.7 06/16/2023    BP Readings from Last 3 Encounters:  06/21/23 130/80  05/05/23 122/62  04/26/23 (!) 152/80   Diabetic Foot Exam - Simple   Simple Foot Form  06/21/2023  3:12 PM  Visual Inspection No deformities, no ulcerations, no other skin breakdown bilaterally: Yes Sensation  Testing Intact to touch and monofilament testing bilaterally: Yes Pulse Check Posterior Tibialis and Dorsalis pulse intact bilaterally: Yes Comments     Lab results reviewed with patient   ASSESSMENT AND PLAN:  Discussed the  following assessment and plan:    ICD-10-CM   1. Visit for preventive health examination  Z00.00     2. Vitamin D deficiency  E55.9     3. Other specified hypothyroidism  E03.8     4. Controlled type 2 diabetes mellitus with complication, without long-term current use of insulin (HCC)  E11.8     5. Medication management  Z79.899     6. Hyperlipidemia, unspecified hyperlipidemia type  E78.5     7. Primary hypertension  I10     8. Adjustment disorder, unspecified type  F43.20     9. Hives ? intermittent  L50.9     Disc mood  some doaws feels down apathy ? Depressed  but only lasts a few days Will follow   lsi interventions  and optimize tsh  Begin vit D otc 2000 I u per day Disc screen time other   Will get thyroid in range And fu and fo from there.  Not sure about cause of intermittent itchy rash  prob hives  disc  no angioedema  Return in about 3 months (around 09/21/2023) for lab in 3 months.  tsh and Vit D3 and hg a1c  and then fu appt .  Patient Care Team: Shyne Lehrke, Neta Mends, MD as PCP - General Lutricia Feil, [facility] as Pharmacist (Pharmacist) Patient Instructions  Good to see you today . Take  vit D3 2000 international unit  per day  For now    same dose thyroid med but take  at least an hour before   any food or meds.   Try time off screen.  Day light therapy and cycle.  To see if helps.   Plan lab in about 3 months and then  visit appt to see how doing      Neta Mends. Debbie Marshall M.D.

## 2023-06-21 ENCOUNTER — Encounter: Payer: Self-pay | Admitting: Internal Medicine

## 2023-06-21 ENCOUNTER — Ambulatory Visit (INDEPENDENT_AMBULATORY_CARE_PROVIDER_SITE_OTHER): Payer: Medicare Other | Admitting: Internal Medicine

## 2023-06-21 ENCOUNTER — Other Ambulatory Visit: Payer: Self-pay | Admitting: Internal Medicine

## 2023-06-21 VITALS — BP 130/80 | HR 73 | Temp 97.9°F | Ht 58.15 in | Wt 122.4 lb

## 2023-06-21 DIAGNOSIS — Z0000 Encounter for general adult medical examination without abnormal findings: Secondary | ICD-10-CM

## 2023-06-21 DIAGNOSIS — E039 Hypothyroidism, unspecified: Secondary | ICD-10-CM

## 2023-06-21 DIAGNOSIS — Z79899 Other long term (current) drug therapy: Secondary | ICD-10-CM | POA: Diagnosis not present

## 2023-06-21 DIAGNOSIS — E118 Type 2 diabetes mellitus with unspecified complications: Secondary | ICD-10-CM

## 2023-06-21 DIAGNOSIS — I10 Essential (primary) hypertension: Secondary | ICD-10-CM

## 2023-06-21 DIAGNOSIS — E559 Vitamin D deficiency, unspecified: Secondary | ICD-10-CM | POA: Diagnosis not present

## 2023-06-21 DIAGNOSIS — E785 Hyperlipidemia, unspecified: Secondary | ICD-10-CM

## 2023-06-21 DIAGNOSIS — E038 Other specified hypothyroidism: Secondary | ICD-10-CM | POA: Diagnosis not present

## 2023-06-21 DIAGNOSIS — F4320 Adjustment disorder, unspecified: Secondary | ICD-10-CM

## 2023-06-21 DIAGNOSIS — L509 Urticaria, unspecified: Secondary | ICD-10-CM | POA: Diagnosis not present

## 2023-06-21 NOTE — Patient Instructions (Addendum)
 Good to see you today . Take  vit D3 2000 international unit  per day  For now    same dose thyroid med but take  at least an hour before   any food or meds.   Try time off screen.  Day light therapy and cycle.  To see if helps.   Plan lab in about 3 months and then  visit appt to see how doing

## 2023-06-25 ENCOUNTER — Encounter: Payer: Self-pay | Admitting: Internal Medicine

## 2023-07-17 ENCOUNTER — Other Ambulatory Visit: Payer: Self-pay | Admitting: Family

## 2023-07-17 DIAGNOSIS — E119 Type 2 diabetes mellitus without complications: Secondary | ICD-10-CM

## 2023-08-14 ENCOUNTER — Telehealth: Payer: Self-pay

## 2023-08-14 ENCOUNTER — Ambulatory Visit: Payer: Self-pay

## 2023-08-14 ENCOUNTER — Ambulatory Visit (INDEPENDENT_AMBULATORY_CARE_PROVIDER_SITE_OTHER): Admitting: Family Medicine

## 2023-08-14 VITALS — BP 112/80 | HR 95 | Temp 100.8°F | Ht 58.5 in | Wt 119.0 lb

## 2023-08-14 DIAGNOSIS — R509 Fever, unspecified: Secondary | ICD-10-CM

## 2023-08-14 DIAGNOSIS — J069 Acute upper respiratory infection, unspecified: Secondary | ICD-10-CM | POA: Diagnosis not present

## 2023-08-14 LAB — POC COVID19 BINAXNOW: SARS Coronavirus 2 Ag: NEGATIVE

## 2023-08-14 LAB — POCT INFLUENZA A/B
Influenza A, POC: NEGATIVE
Influenza B, POC: NEGATIVE

## 2023-08-14 LAB — POCT RAPID STREP A (OFFICE): Rapid Strep A Screen: NEGATIVE

## 2023-08-14 MED ORDER — ACETAMINOPHEN 500 MG PO TABS: 1000.0000 mg | ORAL_TABLET | Freq: Once | ORAL | Status: AC

## 2023-08-14 MED ORDER — AMOXICILLIN-POT CLAVULANATE 875-125 MG PO TABS: 1.0000 | ORAL_TABLET | Freq: Two times a day (BID) | ORAL | 0 refills | Status: AC

## 2023-08-14 NOTE — Telephone Encounter (Signed)
 Please see TE.

## 2023-08-14 NOTE — Telephone Encounter (Signed)
 Copied from CRM [id number]. Topic: Clinical - Medical Advice >> Aug 14, 2023  8:14 AM Marlan Silva wrote: Reason for CRM: Patient called in stating that she has flu like symptoms, feels like a bad cold. She said she has some chest congestion and is hoping Dr. Ethel Henry can send her some type of antibiotic over to the pharmacy like she did last time. I offered patient an appointment to be seen and she declined she said she would just like a prescription sent to the pharmacy on file. >> Aug 14, 2023 10:31 AM Corin V wrote: Patient called in to check if prescription has been sent in. Advised it was sent to nurse and they are waitng for Dr. Ethel Henry to review and advise.

## 2023-08-14 NOTE — Progress Notes (Signed)
 Acute Office Visit   Subjective:  Patient ID: Debbie Marshall, female    DOB: 1945-08-30, 78 y.o.   MRN: [id number]  Chief Complaint  Patient presents with   Fever   Fatigue   Generalized Body Aches   chest congestion   Cough    X1 week  Took mucex for x4days     HPI:  Patient is here for an acute visit. Patient is experiencing the following symptoms:  Fever Chest congestion Sore throat Body aches Fatigue Cough-wet cough, non-productive  Headaches Nasal congestion/drainage  Denies ear pain/drainage, chest pain, shortness of breath.   Symptoms started about a week ago.  She reports she took Mucinex with no improvement. Last night was the last time she took medication for her symptoms.   Been around father with similar symptoms.  Denies any recent travel.  Review of Systems  Constitutional:  Positive for fever.  Respiratory:  Positive for cough.   See HPI above      Objective:   BP 112/80   Pulse 95   Temp (!) 100.8 F (38.2 C) (Oral)   Ht 4' 10.5" (1.486 m)   Wt 119 lb (54 kg)   SpO2 93%   BMI 24.45 kg/m    Physical Exam Vitals reviewed.  Constitutional:      General: She is not in acute distress.    Appearance: Normal appearance. She is not ill-appearing (Mild), toxic-appearing or diaphoretic.  HENT:     Head: Normocephalic and atraumatic.     Right Ear: Tympanic membrane, ear canal and external ear normal. There is no impacted cerumen.     Left Ear: Tympanic membrane, ear canal and external ear normal. There is no impacted cerumen.     Nose:     Right Sinus: No maxillary sinus tenderness or frontal sinus tenderness.     Left Sinus: No maxillary sinus tenderness or frontal sinus tenderness.     Mouth/Throat:     Pharynx: Oropharynx is clear. Uvula midline. No pharyngeal swelling, oropharyngeal exudate, posterior oropharyngeal erythema, uvula swelling or postnasal drip.  Eyes:     General:        Right eye: No discharge.        Left eye: No discharge.      Conjunctiva/sclera: Conjunctivae normal.  Cardiovascular:     Rate and Rhythm: Normal rate and regular rhythm.     Heart sounds: Normal heart sounds. No murmur heard.    No friction rub. No gallop.  Pulmonary:     Effort: Pulmonary effort is normal. No respiratory distress.     Breath sounds: Normal breath sounds.  Musculoskeletal:        General: Normal range of motion.  Lymphadenopathy:     Head:     Right side of head: No submental or submandibular adenopathy.     Left side of head: No submental or submandibular adenopathy.  Skin:    General: Skin is warm and dry.  Neurological:     General: No focal deficit present.     Mental Status: She is alert and oriented to person, place, and time. Mental status is at baseline.  Psychiatric:        Mood and Affect: Mood normal.        Behavior: Behavior normal.        Thought Content: Thought content normal.        Judgment: Judgment normal.    Results for orders placed or performed in visit on 08/14/23  POC Rapid Strep A  Result Value Ref Range   Rapid Strep A Screen Negative Negative  POC COVID-19  Result Value Ref Range   SARS Coronavirus 2 Ag Negative Negative  POC Influenza A/B  Result Value Ref Range   Influenza A, POC Negative Negative   Influenza B, POC Negative Negative      Assessment & Plan:  Upper respiratory tract infection, unspecified type -     Amoxicillin -Pot Clavulanate; Take 1 tablet by mouth 2 (two) times daily for 10 days.  Dispense: 20 tablet; Refill: 0  Fever, unspecified fever cause -     Acetaminophen -     POCT rapid strep A -     POC COVID-19 BinaxNow -     POCT Influenza A/B  -Negative for covid, strep throat, and influenza. -Prescribed Augmentin  875-125mg  tablet, take 1 tablet every 12 hours for 10 days.  -Alternate Tylenol 1000mg  and Ibuprofen 600-800mg  every 4 hours for pain, headache, and body aches. Recommend to eat something when taking Ibuprofen.   -Rest, hydrate. -If you develop  chest pain or shortness of breath, please go to the closes emergency department.  -Continue taking Mucinex for cough.  -Follow up if not improved or symptoms do not improve.    Bentley Haralson, NP

## 2023-08-14 NOTE — Telephone Encounter (Signed)
 Follow up with pt. Pt inform she was asking for antibiotic for sx she is having. Pt already done seen with provider in office. Antibiotic sent in.

## 2023-08-14 NOTE — Patient Instructions (Addendum)
-  Negative for covid, strep throat, and influenza. -Prescribed Augmentin  875-125mg  tablet, take 1 tablet every 12 hours for 10 days.  -Alternate Tylenol 1000mg  and Ibuprofen 600-800mg  every 4 hours for pain, headache, and body aches. Recommend to eat something when taking Ibuprofen.   -Rest, hydrate. -If you develop chest pain or shortness of breath, please go to the closes emergency department.  -Continue taking Mucinex for cough.  -Follow up if not improved or symptoms do not improve.

## 2023-08-14 NOTE — Telephone Encounter (Signed)
 Copied from CRM [id number]. Topic: General - Other >> Aug 14, 2023 12:12 PM Howard Macho wrote: Reason for CRM: patient called about previous message sent this morning regarding medication

## 2023-08-14 NOTE — Telephone Encounter (Signed)
 Attempt to reach pt. No success. Pt has appt with Rebecca Campus, NP today. Will ask pt then.

## 2023-09-20 ENCOUNTER — Other Ambulatory Visit

## 2023-09-20 DIAGNOSIS — E039 Hypothyroidism, unspecified: Secondary | ICD-10-CM | POA: Diagnosis not present

## 2023-09-20 DIAGNOSIS — E118 Type 2 diabetes mellitus with unspecified complications: Secondary | ICD-10-CM

## 2023-09-20 DIAGNOSIS — E559 Vitamin D deficiency, unspecified: Secondary | ICD-10-CM

## 2023-09-20 DIAGNOSIS — Z79899 Other long term (current) drug therapy: Secondary | ICD-10-CM | POA: Diagnosis not present

## 2023-09-20 LAB — VITAMIN D 25 HYDROXY (VIT D DEFICIENCY, FRACTURES): Vit D, 25-Hydroxy: 52 ng/mL (ref 30–100)

## 2023-09-20 LAB — HEMOGLOBIN A1C
Hgb A1c MFr Bld: 6.5 % — ABNORMAL HIGH (ref <5.7)
Mean Plasma Glucose: 140 mg/dL
eAG (mmol/L): 7.7 mmol/L

## 2023-09-20 LAB — T4, FREE: Free T4: 1.3 ng/dL (ref 0.8–1.8)

## 2023-09-20 LAB — TSH: TSH: 1.33 m[IU]/L (ref 0.40–4.50)

## 2023-09-20 NOTE — Addendum Note (Signed)
 Addended by: Claudina Cullen on: 09/20/2023 08:28 AM   Modules accepted: Orders

## 2023-09-22 ENCOUNTER — Other Ambulatory Visit: Payer: Self-pay | Admitting: Internal Medicine

## 2023-09-22 DIAGNOSIS — Z1231 Encounter for screening mammogram for malignant neoplasm of breast: Secondary | ICD-10-CM

## 2023-10-01 ENCOUNTER — Ambulatory Visit: Payer: Self-pay | Admitting: Internal Medicine

## 2023-10-01 NOTE — Progress Notes (Signed)
 Thyroid  and vit D now in range .  A1c stable  slight better at 6.5  Continue on vit d and same thyroid  dosing  ( please confirm dosing on med list

## 2023-10-10 ENCOUNTER — Other Ambulatory Visit: Payer: Self-pay | Admitting: Internal Medicine

## 2023-10-16 ENCOUNTER — Ambulatory Visit
Admission: RE | Admit: 2023-10-16 | Discharge: 2023-10-16 | Disposition: A | Discharge status: Home / Self Care | Source: Ambulatory Visit | Attending: Internal Medicine | Admitting: Internal Medicine

## 2023-10-16 DIAGNOSIS — Z1231 Encounter for screening mammogram for malignant neoplasm of breast: Secondary | ICD-10-CM

## 2023-11-07 ENCOUNTER — Other Ambulatory Visit: Payer: Self-pay

## 2023-11-07 MED ORDER — SIMVASTATIN 40 MG PO TABS: 40.0000 mg | ORAL_TABLET | Freq: Every day | ORAL | 3 refills | Status: AC

## 2024-01-03 NOTE — Telephone Encounter (Signed)
 Pt missed her appt. Yesterday and called in wanting to know what to do about her server rhinitis since she missed her appointment and we can not see her until Wed. 03/25/20. Dr. Ethyl toldme to send in Sterapred dose Pak 10 mg for 6 days 21 count, for her to also use saline rinse, musinex, and use nasal spray. When I called and told pt. She stated she really did not want Sterapred due to she has had it 3 times this year, is there something else she can take?

## 2024-01-08 ENCOUNTER — Other Ambulatory Visit: Payer: Self-pay | Admitting: Internal Medicine

## 2024-01-08 NOTE — Telephone Encounter (Unsigned)
 Copied from CRM #[id number]. Topic: Clinical - Medication Refill >> Jan 08, 2024  9:34 AM Rea C wrote: Medication: levothyroxine  (SYNTHROID ) 50 MCG tablet  Has the patient contacted their pharmacy? Patient tried to do it through Mychart. Patient needs a renewal per pharmacy.   This is the patient's preferred pharmacy:   [facility] - [place] - [address] [place] [address] Phone: [phone] Fax: [phone]  Is this the correct pharmacy for this prescription? Yes If no, delete pharmacy and type the correct one.   Has the prescription been filled recently? No  Is the patient out of the medication? Patient has about 4 or  5 days   Has the patient been seen for an appointment in the last year OR does the patient have an upcoming appointment? Yes  Can we respond through MyChart? Yes  Agent: Please be advised that Rx refills may take up to 3 business days. We ask that you follow-up with your pharmacy.

## 2024-01-10 MED ORDER — LEVOTHYROXINE SODIUM 50 MCG PO TABS: 50.0000 ug | ORAL_TABLET | Freq: Every day | ORAL | 3 refills | Status: AC

## 2024-03-06 ENCOUNTER — Encounter: Payer: Self-pay | Admitting: Internal Medicine

## 2024-03-06 ENCOUNTER — Ambulatory Visit: Admitting: Internal Medicine

## 2024-03-06 VITALS — BP 138/74 | HR 66 | Temp 97.9°F | Ht 58.5 in | Wt 120.5 lb

## 2024-03-06 DIAGNOSIS — E1169 Type 2 diabetes mellitus with other specified complication: Secondary | ICD-10-CM | POA: Diagnosis not present

## 2024-03-06 DIAGNOSIS — Z79899 Other long term (current) drug therapy: Secondary | ICD-10-CM

## 2024-03-06 DIAGNOSIS — I499 Cardiac arrhythmia, unspecified: Secondary | ICD-10-CM | POA: Diagnosis not present

## 2024-03-06 DIAGNOSIS — E039 Hypothyroidism, unspecified: Secondary | ICD-10-CM

## 2024-03-06 DIAGNOSIS — I10 Essential (primary) hypertension: Secondary | ICD-10-CM

## 2024-03-06 DIAGNOSIS — E785 Hyperlipidemia, unspecified: Secondary | ICD-10-CM

## 2024-03-06 DIAGNOSIS — R002 Palpitations: Secondary | ICD-10-CM

## 2024-03-06 DIAGNOSIS — E538 Deficiency of other specified B group vitamins: Secondary | ICD-10-CM

## 2024-03-06 NOTE — Progress Notes (Signed)
 Chief Complaint  Patient presents with   Irregular Heart Beat    Pt c/o irregular heartbeat in the morning for 2wks. Had an episode this morning. skip taking synthroid  one time on Monday.     HPI: Debbie Marshall 78 y.o. come in for above    SDA . Noted  weeks ago which she describes as a pounding and feeling her heartbeat pumping in her chest begins around 10:30 in the morning maybe last an hour or 2 with no other associated symptoms.  May be irregular not defined does not happen all the time but comes and goes over the last few weeks She also feels cold at night but no fever bleeding other. Wonders if  thyroid  off .  As she is now taking it away from food or other medicines.  On a regular basis.   ROS: See pertinent positives and negatives per HPI. No cp sob  although feels heavy at that time.    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  Dr ethyl   VITAMIN D  DEFICIENCY 08/14/2009    Family History  Problem Relation Age of Onset   Stroke Mother 51   Arthritis Mother    Rheum arthritis Brother    Arthritis Brother    Gout Brother    Colon cancer Other    Esophageal cancer Neg Hx    Stomach cancer Neg Hx    Rectal cancer Neg Hx    Colon polyps Neg Hx    Breast cancer Neg Hx     Social History   Socioeconomic History   Marital status: Single    Spouse name: Not on file   Number  of children: Not on file   Years of education: Not on file   Highest education level: Doctorate  Occupational History   Occupation: PhD Professor  Tobacco Use   Smoking status: Never   Smokeless tobacco: Never  Vaping Use   Vaping status: Never Used  Substance and Sexual Activity   Alcohol use: Yes    Alcohol/week: 1.0 standard drink of alcohol    Types: 1 Glasses of wine per week    Comment: rarely   Drug use: No   Sexual activity: Not on file  Other Topics Concern   Not on file  Social History Narrative   HH of 2 Father and her    cat     PhD professor in information systems    No tobacco or alcohol exercising regularly currently   Social Drivers of Health   Financial Resource Strain: Low Risk  (03/06/2024)   Overall Financial Resource Strain (CARDIA)    Difficulty of Paying Living Expenses: Not hard at all  Food Insecurity: No Food Insecurity (03/06/2024)   Hunger Vital Sign    Worried About Running Out of Food in the Last Year:  Never true    Ran Out of Food in the Last Year: Never true  Transportation Needs: No Transportation Needs (03/06/2024)   PRAPARE - Administrator, Civil Service (Medical): No    Lack of Transportation (Non-Medical): No  Physical Activity: Sufficiently Active (03/06/2024)   Exercise Vital Sign    Days of Exercise per Week: 4 days    Minutes of Exercise per Session: 50 min  Stress: No Stress Concern Present (03/06/2024)   [employer] of Occupational Health - Occupational Stress Questionnaire    Feeling of Stress: Not at all  Social Connections: Unknown (03/06/2024)   Social Connection and Isolation Panel    Frequency of Communication with Friends and Family: Twice a week    Frequency of Social Gatherings with Friends and Family: Patient declined    Attends Religious Services: More than 4 times per year    Active Member of [employer] or Organizations: Yes    Attends Engineer, Structural: More than 4 times per year     Marital Status: Never married    Outpatient Medications Prior to Visit  Medication Sig Dispense Refill   ACCU-CHEK GUIDE test strip TEST TWICE DAILY 200 strip 3   Accu-Chek Softclix Lancets lancets TEST TWICE DAILY AS DIRECTED 200 each 3   Blood Glucose Monitoring Suppl (ACCU-CHEK GUIDE) w/Device KIT USE TO CHECK BLOOD SUGAR  TWICE DAILY 1 kit 1   Cetirizine HCl (ZYRTEC PO) Take by mouth as needed.     Cholecalciferol  (VITAMIN D3) 50 MCG (2000 UT) capsule Take 1 capsule (2,000 Units total) by mouth daily.     Fluticasone  Propionate (FLONASE  ALLERGY RELIEF NA) Place into the nose.     levothyroxine  (SYNTHROID ) 50 MCG tablet Take 1 tablet (50 mcg total) by mouth daily. 90 tablet 3   metFORMIN  (GLUCOPHAGE -XR) 500 MG 24 hr tablet TAKE 2 TABLETS BY MOUTH DAILY (Patient taking differently: Take 500 mg by mouth daily.) 180 tablet 3   simvastatin  (ZOCOR ) 40 MG tablet Take 1 tablet (40 mg total) by mouth at bedtime. 90 tablet 3   Facility-Administered Medications Prior to Visit  Medication Dose Route Frequency Provider Last Rate Last Admin   acetaminophen  (TYLENOL ) tablet 1,000 mg  1,000 mg Oral Once          EXAM:  BP 138/74 (BP Location: Left Arm, Patient Position: Sitting, Cuff Size: Normal)   Pulse 66   Temp 97.9 F (36.6 C) (Oral)   Ht 4' 10.5 (1.486 m)   Wt 120 lb 8 oz (54.7 kg)   SpO2 96%   BMI 24.76 kg/m   Body mass index is 24.76 kg/m.  GENERAL: vitals reviewed and listed above, alert, oriented, appears well hydrated and in no acute distress HEENT: atraumatic, conjunctiva  clear, no obvious abnormalities on inspection of external nose and ears  NECK: no obvious masses on inspection palpation  LUNGS: clear to auscultation bilaterally, no wheezes, rales or rhonchi, good air movement CV: HRRR, no clubbing cyanosis or  peripheral edema nl cap refill  EKG sr nl intervals neg t wave v2 prob  normal for age MS: moves all extremities without noticeable focal  abnormality PSYCH:  pleasant and cooperative, no obvious depression or anxiety Lab Results  Component Value Date   WBC 5.3 06/16/2023   HGB 14.7 06/16/2023   HCT 44.5 06/16/2023   PLT 221.0 06/16/2023   GLUCOSE 124 (H) 06/16/2023   CHOL 150 06/16/2023   TRIG 97.0 06/16/2023   HDL 58.90 06/16/2023  LDLDIRECT 163.6 10/23/2006   LDLCALC 72 06/16/2023   ALT 15 06/16/2023   AST 19 06/16/2023   NA 142 06/16/2023   K 4.1 06/16/2023   CL 104 06/16/2023   CREATININE 0.76 06/16/2023   BUN 21 06/16/2023   CO2 27 06/16/2023   TSH 1.33 09/20/2023   INR 1.0 ratio 11/14/2008   HGBA1C 6.5 (H) 09/20/2023   MICROALBUR <0.7 06/16/2023   BP Readings from Last 3 Encounters:  03/06/24 138/74  08/14/23 112/80  06/21/23 130/80    ASSESSMENT AND PLAN:  Discussed the following assessment and plan:  Palpitation - Plan: Basic metabolic panel with GFR, CBC with Differential/Platelet, Hemoglobin A1c, Hepatic function panel, Lipid panel, TSH, T4, free, Magnesium, Vitamin B12  Irregular heart rate percieved - Plan: EKG 12-Lead, Basic metabolic panel with GFR, CBC with Differential/Platelet, Hemoglobin A1c, Hepatic function panel, Lipid panel, TSH, T4, free, Magnesium, Vitamin B12  Type 2 diabetes mellitus with other specified complication, without long-term current use of insulin (HCC) - Plan: Basic metabolic panel with GFR, CBC with Differential/Platelet, Hemoglobin A1c, Hepatic function panel, Lipid panel, TSH, T4, free, Magnesium, Vitamin B12  Medication management - Plan: Basic metabolic panel with GFR, CBC with Differential/Platelet, Hemoglobin A1c, Hepatic function panel, Lipid panel, TSH, T4, free, Magnesium, Vitamin B12  Acquired hypothyroidism - Plan: Basic metabolic panel with GFR, CBC with Differential/Platelet, Hemoglobin A1c, Hepatic function panel, Lipid panel, TSH, T4, free, Magnesium, Vitamin B12  Hyperlipidemia, unspecified hyperlipidemia type - Plan: Basic metabolic panel with GFR, CBC with  Differential/Platelet, Hemoglobin A1c, Hepatic function panel, Lipid panel, TSH, T4, free, Magnesium, Vitamin B12  Primary hypertension - Plan: Basic metabolic panel with GFR, CBC with Differential/Platelet, Hemoglobin A1c, Hepatic function panel, Lipid panel, TSH, T4, free, Magnesium, Vitamin B12  Low serum vitamin B12 - Plan: Basic metabolic panel with GFR, CBC with Differential/Platelet, Hemoglobin A1c, Hepatic function panel, Lipid panel, TSH, T4, free, Magnesium, Vitamin B12 Discussed differential diagnosis fortunately normal reassuring exam and the fact that she has no change in exercise tolerance or other significant associated symptoms. She feels that it could be related to her thyroid  medicine since  she is now taking it as directed and generic does look a bit different. We discussed evaluation, differential diagnoses ,possibility heart rate monitoring echocardiogram . She wants to wait on this until after getting lab work. She also was concerned as her grandfather had an MI heart disease without hypertension. We can always get a cardiology consult risk assessment to decide on evaluation.  Her risks are age well-controlled diabetes hypertension. -Patient advised to return or notify health care team  if  new concerns arise.  Patient Instructions  Exam is good  today   Plan lab for next week .  As discussed   Consider r  cards evaluation for risk assessment rhythm as we discussed   Raynaldo Falco K. Lorelee Mclaurin M.D.

## 2024-03-06 NOTE — Patient Instructions (Addendum)
 Exam is good  today   Plan lab for next week .  As discussed   Consider r  cards evaluation for risk assessment rhythm as we discussed

## 2024-03-09 ENCOUNTER — Encounter: Payer: Self-pay | Admitting: Internal Medicine

## 2024-03-11 ENCOUNTER — Other Ambulatory Visit (INDEPENDENT_AMBULATORY_CARE_PROVIDER_SITE_OTHER)

## 2024-03-11 DIAGNOSIS — E1169 Type 2 diabetes mellitus with other specified complication: Secondary | ICD-10-CM | POA: Diagnosis not present

## 2024-03-11 DIAGNOSIS — Z79899 Other long term (current) drug therapy: Secondary | ICD-10-CM

## 2024-03-11 DIAGNOSIS — I10 Essential (primary) hypertension: Secondary | ICD-10-CM

## 2024-03-11 DIAGNOSIS — E039 Hypothyroidism, unspecified: Secondary | ICD-10-CM

## 2024-03-11 DIAGNOSIS — E538 Deficiency of other specified B group vitamins: Secondary | ICD-10-CM

## 2024-03-11 DIAGNOSIS — I499 Cardiac arrhythmia, unspecified: Secondary | ICD-10-CM | POA: Diagnosis not present

## 2024-03-11 DIAGNOSIS — R002 Palpitations: Secondary | ICD-10-CM

## 2024-03-11 DIAGNOSIS — E785 Hyperlipidemia, unspecified: Secondary | ICD-10-CM

## 2024-03-11 LAB — CBC WITH DIFFERENTIAL/PLATELET
Basophils Absolute: 0 K/uL (ref 0.0–0.1)
Basophils Relative: 0.8 % (ref 0.0–3.0)
Eosinophils Absolute: 0.2 K/uL (ref 0.0–0.7)
Eosinophils Relative: 3.6 % (ref 0.0–5.0)
HCT: 43 % (ref 36.0–46.0)
Hemoglobin: 14.4 g/dL (ref 12.0–15.0)
Lymphocytes Relative: 37 % (ref 12.0–46.0)
Lymphs Abs: 2 K/uL (ref 0.7–4.0)
MCHC: 33.5 g/dL (ref 30.0–36.0)
MCV: 91.2 fl (ref 78.0–100.0)
Monocytes Absolute: 0.3 K/uL (ref 0.1–1.0)
Monocytes Relative: 5.7 % (ref 3.0–12.0)
Neutro Abs: 2.9 K/uL (ref 1.4–7.7)
Neutrophils Relative %: 52.9 % (ref 43.0–77.0)
Platelets: 202 K/uL (ref 150.0–400.0)
RBC: 4.71 Mil/uL (ref 3.87–5.11)
RDW: 13.3 % (ref 11.5–15.5)
WBC: 5.4 K/uL (ref 4.0–10.5)

## 2024-03-11 LAB — HEMOGLOBIN A1C: Hgb A1c MFr Bld: 6.2 % (ref 4.6–6.5)

## 2024-03-11 LAB — LIPID PANEL
Cholesterol: 126 mg/dL (ref 0–200)
HDL: 54.2 mg/dL (ref >39.00)
LDL Cholesterol: 50 mg/dL (ref 0–99)
NonHDL: 71.61
Total CHOL/HDL Ratio: 2
Triglycerides: 109 mg/dL (ref 0.0–149.0)
VLDL: 21.8 mg/dL (ref 0.0–40.0)

## 2024-03-11 LAB — HEPATIC FUNCTION PANEL
ALT: 18 U/L (ref 0–35)
AST: 22 U/L (ref 0–37)
Albumin: 4.6 g/dL (ref 3.5–5.2)
Alkaline Phosphatase: 54 U/L (ref 39–117)
Bilirubin, Direct: 0.1 mg/dL (ref 0.0–0.3)
Total Bilirubin: 0.8 mg/dL (ref 0.2–1.2)
Total Protein: 7 g/dL (ref 6.0–8.3)

## 2024-03-11 LAB — BASIC METABOLIC PANEL WITH GFR
BUN: 15 mg/dL (ref 6–23)
CO2: 31 meq/L (ref 19–32)
Calcium: 9.7 mg/dL (ref 8.4–10.5)
Chloride: 103 meq/L (ref 96–112)
Creatinine, Ser: 0.73 mg/dL (ref 0.40–1.20)
GFR: 79.7 mL/min (ref >60.00)
Glucose, Bld: 95 mg/dL (ref 70–99)
Potassium: 3.9 meq/L (ref 3.5–5.1)
Sodium: 141 meq/L (ref 135–145)

## 2024-03-11 LAB — MAGNESIUM: Magnesium: 2 mg/dL (ref 1.5–2.5)

## 2024-03-11 LAB — TSH: TSH: 2.79 u[IU]/mL (ref 0.35–5.50)

## 2024-03-11 LAB — T4, FREE: Free T4: 0.72 ng/dL (ref 0.60–1.60)

## 2024-03-11 LAB — VITAMIN B12: Vitamin B-12: 297 pg/mL (ref 211–911)

## 2024-03-20 ENCOUNTER — Encounter: Payer: Self-pay | Admitting: Physician Assistant

## 2024-03-22 ENCOUNTER — Ambulatory Visit: Payer: Self-pay

## 2024-03-22 NOTE — Telephone Encounter (Signed)
 FYI Only or Action Required?: Action required by provider: request for appointment and refused UC.  Patient was last seen in primary care on 03/06/2024 by Panosh, Debbie POUR, MD.  Called Nurse Triage reporting Sore Throat.  Symptoms began 4 days ago.  Interventions attempted: OTC medications: tylenol  and Rest, hydration, or home remedies.  Symptoms are: unchanged.  Triage Disposition: See Physician Within 24 Hours  Patient/caregiver understands and will follow disposition?: No, wishes to speak with PCP  Copied from CRM #[id number]. Topic: Clinical - Red Word Triage >> Mar 22, 2024  4:36 PM Alfonso ORN wrote: Red Word that prompted transfer to Nurse Triage: severe cold symptoms: sore throat , body aches and chills Reason for Disposition  Diabetes mellitus or weak immune system (e.g., HIV positive, cancer chemo, splenectomy, organ transplant, chronic steroids)  Answer Assessment - Initial Assessment Questions 1. ONSET: When did the throat start hurting? (Hours or days ago)      Started four days  2. SEVERITY: How bad is the sore throat? (Scale 1-10; mild, moderate or severe)     moderate 3. STREP EXPOSURE: Has there been any exposure to strep within the past week? If Yes, ask: What type of contact occurred?      no 4.  VIRAL SYMPTOMS: Are there any symptoms of a cold, such as a runny nose, cough, hoarse voice or red eyes?      Runny nose, cough, chills 5. FEVER: Do you have a fever? If Yes, ask: What is your temperature, how was it measured, and when did it start?     no 6. PUS ON THE TONSILS: Is there pus on the tonsils in the back of your throat?     no 7. OTHER SYMPTOMS: Do you have any other symptoms? (e.g., difficulty breathing, headache, rash)     headache  Protocols used: Sore Throat-A-AH

## 2024-03-25 ENCOUNTER — Ambulatory Visit: Admitting: Family Medicine

## 2024-03-25 ENCOUNTER — Encounter: Payer: Self-pay | Admitting: Family Medicine

## 2024-03-25 VITALS — BP 136/80 | HR 53 | Temp 98.1°F | Ht 58.5 in | Wt 120.2 lb

## 2024-03-25 DIAGNOSIS — R52 Pain, unspecified: Secondary | ICD-10-CM

## 2024-03-25 DIAGNOSIS — R0981 Nasal congestion: Secondary | ICD-10-CM

## 2024-03-25 DIAGNOSIS — J029 Acute pharyngitis, unspecified: Secondary | ICD-10-CM

## 2024-03-25 DIAGNOSIS — J069 Acute upper respiratory infection, unspecified: Secondary | ICD-10-CM

## 2024-03-25 DIAGNOSIS — R051 Acute cough: Secondary | ICD-10-CM

## 2024-03-25 LAB — POCT INFLUENZA A/B
Influenza A, POC: NEGATIVE
Influenza B, POC: NEGATIVE

## 2024-03-25 LAB — POC COVID19 BINAXNOW: SARS Coronavirus 2 Ag: NEGATIVE

## 2024-03-25 NOTE — Patient Instructions (Signed)
 The COVID and flu test were negative.  Continue supportive care with rest, OTC cough/cold medications, antihistamines, gargling with warm salt water or Chloraseptic spray, hydration/warm fluids, Tylenol  or NSAIDs, saline nasal rinse or Flonase .  Follow-up with PCP for continued or worsening symptoms.

## 2024-03-25 NOTE — Progress Notes (Signed)
 Established Patient Office Visit   Subjective  Patient ID: Debbie Marshall, female    DOB: May 04, 1945  Age: 79 y.o. MRN: [id number]  Chief Complaint  Patient presents with   Acute Visit    Patient came in with severe cold symptoms that started 1 week ago.  Patient has a sore throat, wheezing, coughing, congestion, runny nose, body aches.    Patient is a 78 year old female followed by Dr. Charlett and seen for acute concern.  Patient endorses body aches, sore throat, cough, nasal congestion, headache x 1 week.  Denies fever, nausea, vomiting, ear pain/pressure, facial pain/pressure, diarrhea.  Unsure of sick contacts.  Patient has been taking Tylenol  for symptoms.  Inquires about starting an antibiotic to help prevent things from becoming worse.    Patient Active Problem List   Diagnosis Date Noted   History of positive PPD 10/20/2021   Hip crepitus 02/27/2014   Visit for preventive health examination 10/28/2013   Nocturia 10/28/2013   Osteopenia 11/15/2012   AC (acromioclavicular) arthritis 11/10/2011   Cubital tunnel syndrome on right 10/14/2011   Arm pain, right 10/14/2011   Arthritis 10/05/2010   Physical exam, annual 06/02/2010   Diabetes mellitus type 2, uncomplicated (HCC) 06/02/2010   Positive PPD 06/02/2010   Vitamin D  deficiency 08/14/2009   ABNORMAL EXAM-BILIARY TRACT 11/14/2008   PAIN IN JOINT, HAND 07/22/2008   Hypothyroidism 10/30/2006   Hyperlipidemia 09/07/2006   GERD 09/07/2006   Osteoarthritis 09/07/2006   Other specified abnormal findings of blood chemistry 09/07/2006   Past Medical History:  Diagnosis Date   ABNORMAL EXAM-BILIARY TRACT 11/14/2008   ABNORMAL TRANSAMINASE, (LFT'S) 11/14/2008    2005 US  shows fatty liver   Allergy JANUARY 2025   DON'T KNOW THE REASON   Cancer (HCC) 1996   tongue cancer   Diabetes mellitus without complication (HCC)    FASTING HYPERGLYCEMIA 09/07/2006   GERD 09/07/2006   HYPERLIPIDEMIA 09/07/2006   HYPOTHYROIDISM  10/30/2006   Nonspecific abnormal results of liver function study 09/07/2006   Normal nuclear stress test 2005   stress cardiolite    OSTEOARTHRITIS 09/07/2006   Positive PPD    [place]- shows positive after receiving   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  Dr ethyl   VITAMIN D  DEFICIENCY 08/14/2009   Past Surgical History:  Procedure Laterality Date   COLONOSCOPY     NASAL POLYP SURGERY  04/16/2020   POLYPECTOMY     SALIVARY GLAND SURGERY  1996   Tumor removed on back of tongue and salivary gland   Social History[1] Family History  Problem Relation Age of Onset   Stroke Mother 88   Arthritis Mother    Rheum arthritis Brother    Arthritis Brother    Gout Brother    Colon cancer Other    Esophageal cancer Neg Hx    Stomach cancer Neg Hx    Rectal cancer Neg Hx    Colon polyps Neg Hx    Breast cancer Neg Hx    Allergies[2]  ROS Negative unless stated above    Objective:     BP 136/80 (BP Location: Left Arm, Patient Position: Sitting, Cuff Size: Normal)   Pulse (!) 53   Temp 98.1 F (36.7 C) (Oral)   Ht 4' 10.5 (1.486 m)   Wt 120 lb 3.2 oz (54.5 kg)   SpO2 98%   BMI 24.69 kg/m  BP Readings from Last 3 Encounters:  03/25/24 136/80  03/06/24 138/74  08/14/23 112/80   Wt Readings from Last 3 Encounters:  03/25/24 120 lb 3.2 oz (54.5 kg)  03/06/24 120 lb 8 oz (54.7 kg)  08/14/23 119 lb (54 kg)      Physical Exam Constitutional:      General: She is not in acute distress.    Appearance: Normal appearance.  HENT:     Head: Normocephalic and atraumatic.     Right Ear: Tympanic membrane normal.     Left Ear: Tympanic membrane normal.     Nose: Congestion present.     Mouth/Throat:     Mouth: Mucous membranes are moist.     Pharynx: Posterior oropharyngeal erythema and postnasal drip present.  Cardiovascular:     Rate and Rhythm: Normal rate and regular rhythm.     Heart sounds: Normal heart sounds. No  murmur heard.    No gallop.  Pulmonary:     Effort: Pulmonary effort is normal. No respiratory distress.     Breath sounds: Normal breath sounds. No wheezing, rhonchi or rales.  Skin:    General: Skin is warm and dry.  Neurological:     Mental Status: She is alert and oriented to person, place, and time.        05/05/2023    9:47 AM 04/26/2023    2:15 PM 04/22/2022    1:35 PM  Depression screen PHQ 2/9  Decreased Interest 0 1 0  Down, Depressed, Hopeless 0 1 0  PHQ - 2 Score 0 2 0  Altered sleeping 0 1   Tired, decreased energy 0 1   Change in appetite 0 0   Feeling bad or failure about yourself  0 0   Trouble concentrating 0 0   Moving slowly or fidgety/restless 0 0   Suicidal thoughts 0 0   PHQ-9 Score 0  4    Difficult doing work/chores Not difficult at all Not difficult at all      Data saved with a previous flowsheet row definition      04/26/2023    2:15 PM  GAD 7 : Generalized Anxiety Score  Nervous, Anxious, on Edge 0  Control/stop worrying 0  Worry too much - different things 0  Trouble relaxing 0  Restless 0  Easily annoyed or irritable 0  Afraid - awful might happen 0  Total GAD 7 Score 0     Results for orders placed or performed in visit on 03/25/24  POC COVID-19 BinaxNow  Result Value Ref Range   SARS Coronavirus 2 Ag Negative Negative  POC Influenza A/B  Result Value Ref Range   Influenza A, POC Negative Negative   Influenza B, POC Negative Negative      Assessment & Plan:   Viral URI  Acute cough -     POC COVID-19 BinaxNow -     POCT Influenza A/B  Nasal congestion -     POC COVID-19 BinaxNow -     POCT Influenza A/B  Body aches  Sore throat  Acute URI symptoms likely viral.  POC COVID and flu testing negative.  Continue supportive care with rest, OTC cough/cold medications, antihistamines, gargling with warm salt water or Chloraseptic spray, hydration/warm fluids, Tylenol  or NSAIDs, saline nasal rinse or Flonase .  Follow-up with  PCP for continued or worsening symptoms.  Return if symptoms worsen or fail to improve.   Clotilda JONELLE Single, MD      [1]  Social History Tobacco Use   Smoking status: Never  Smokeless tobacco: Never  Vaping Use   Vaping status: Never Used  Substance Use Topics   Alcohol use: Yes    Alcohol/week: 1.0 standard drink of alcohol    Types: 1 Glasses of wine per week    Comment: rarely   Drug use: No  [2]  Allergies Allergen Reactions   Valsartan  Hives    Hives  responsive to  allergy med

## 2024-03-26 NOTE — Telephone Encounter (Signed)
 Pt was seen with Dr. Mercer.

## 2024-04-28 NOTE — Progress Notes (Unsigned)
 "     Debbie Console, PA-C [address] [place], [place]  [address] Phone: [phone]   Gastroenterology Consultation  Referring Provider:     Charlett Apolinar POUR, MD Primary Care Physician:  Charlett Apolinar POUR, MD Primary Gastroenterologist:  Debbie Console, PA-C / Norleen Kiang, MD  Reason for Consultation:     Schedule colonoscopy        HPI:   Discussed the use of AI scribe software for clinical note transcription with the patient, who gave verbal consent to proceed.  Established patient of Dr. Kiang.  Presents to discuss scheduling repeat colonoscopy.  She has previous history of multiple adenomatous colon polyps.  09/2020 last colonoscopy by Dr. Kiang: 3 tubular adenoma polyps (3 mm to 8 mm) removed.  Diverticulosis.  Otherwise normal.  Bowel prep excellent.  3-year repeat colonoscopy was recommended.  History of Present Illness Gastrointestinal Symptoms: - No current gastrointestinal symptoms - No abdominal pain - No heartburn - No dysphagia - No constipation - No diarrhea - No hematochezia - She has no concerns today.  Medication Use: - Not taking blood thinners - She is in excellent health.  Family History of Colorectal Malignancy: - Family history of colon cancer in a great aunt on her father's side     Past Medical History:  Diagnosis Date   ABNORMAL EXAM-BILIARY TRACT 11/14/2008   ABNORMAL TRANSAMINASE, (LFT'S) 11/14/2008    2005 US  shows fatty liver   Allergy JANUARY 2025   DON'T KNOW THE REASON   Cancer (HCC) 1996   tongue cancer   Diabetes mellitus without complication (HCC)    FASTING HYPERGLYCEMIA 09/07/2006   GERD 09/07/2006   HYPERLIPIDEMIA 09/07/2006   HYPOTHYROIDISM 10/30/2006   Nonspecific abnormal results of liver function study 09/07/2006   Normal nuclear stress test 2005   stress cardiolite    OSTEOARTHRITIS 09/07/2006   Positive PPD    [place]- shows positive after receiving   Salivary gland tumor 1996     near  tongue base removed  Dr ethyl   VITAMIN D  DEFICIENCY 08/14/2009    Past Surgical History:  Procedure Laterality Date   COLONOSCOPY     NASAL POLYP SURGERY  04/16/2020   POLYPECTOMY     SALIVARY GLAND SURGERY  1996   Tumor removed on back of tongue and salivary gland    Prior to Admission medications  Medication Sig Start Date End Date Taking? Authorizing Provider  ACCU-CHEK GUIDE test strip TEST TWICE DAILY 05/31/22   Webb, Padonda B, FNP  Accu-Chek Softclix Lancets lancets TEST TWICE DAILY AS DIRECTED 05/31/22   Webb, Padonda B, FNP  Blood Glucose Monitoring Suppl (ACCU-CHEK GUIDE) w/Device KIT USE TO CHECK BLOOD SUGAR  TWICE DAILY 12/18/19   Panosh, Wanda K, MD  Cetirizine HCl (ZYRTEC PO) Take by mouth as needed.    [provider]  Cholecalciferol  (VITAMIN D3) 50 MCG (2000 UT) capsule Take 1 capsule (2,000 Units total) by mouth daily. 06/25/23   Panosh, Wanda K, MD  Fluticasone  Propionate (FLONASE  ALLERGY RELIEF NA) Place into the nose.    [provider]  levothyroxine  (SYNTHROID ) 50 MCG tablet Take 1 tablet (50 mcg total) by mouth daily. 01/10/24   Panosh, Wanda K, MD  metFORMIN  (GLUCOPHAGE -XR) 500 MG 24 hr tablet TAKE 2 TABLETS BY MOUTH DAILY Patient taking differently: Take 500 mg by mouth daily. 10/11/23   Panosh, Apolinar POUR, MD  simvastatin  (ZOCOR ) 40 MG tablet Take 1 tablet (40 mg total) by  mouth at bedtime. 11/07/23   Panosh, Apolinar POUR, MD    Family History  Problem Relation Age of Onset   Stroke Mother 51   Arthritis Mother    Rheum arthritis Brother    Arthritis Brother    Gout Brother    Colon cancer Other    Esophageal cancer Neg Hx    Stomach cancer Neg Hx    Rectal cancer Neg Hx    Colon polyps Neg Hx    Breast cancer Neg Hx      Social History[1]  Allergies as of 04/29/2024 - Review Complete 04/29/2024  Allergen Reaction Noted   Valsartan  Hives 04/26/2023    Review of Systems:    All systems reviewed and negative except where noted in HPI.    Physical Exam:  BP (!) 140/80 (BP Location: Left Arm, Patient Position: Sitting, Cuff Size: Normal)   Pulse 80   Ht 4' 10 (1.473 m) Comment: height measured without shoes  Wt 119 lb 6 oz (54.1 kg)   BMI 24.95 kg/m  No LMP recorded. Patient is postmenopausal.  General:   Alert,  Well-developed, well-nourished, pleasant and cooperative in NAD Lungs:  Respirations even and unlabored.  Clear throughout to auscultation.   No wheezes, crackles, or rhonchi. No acute distress. Heart:  Regular rate and rhythm; no murmurs, clicks, rubs, or gallops. Abdomen:  Normal bowel sounds.  No bruits.  Soft, and non-distended without masses, hepatosplenomegaly or hernias noted.  No Tenderness.  No guarding or rebound tenderness.    Neurologic:  Alert and oriented x3;  grossly normal neurologically. Psych:  Alert and cooperative. Normal mood and affect.   Imaging Studies: No results found.  Labs: CBC    Component Value Date/Time   WBC 5.4 03/11/2024 1052   RBC 4.71 03/11/2024 1052   HGB 14.4 03/11/2024 1052   HCT 43.0 03/11/2024 1052   PLT 202.0 03/11/2024 1052   MCV 91.2 03/11/2024 1052    CMP     Component Value Date/Time   NA 141 03/11/2024 1052   K 3.9 03/11/2024 1052   CL 103 03/11/2024 1052   CO2 31 03/11/2024 1052   GLUCOSE 95 03/11/2024 1052   GLUCOSE 157 (H) 02/22/2006 1527   BUN 15 03/11/2024 1052   CREATININE 0.73 03/11/2024 1052   CREATININE 0.70 02/26/2020 1254   CALCIUM 9.7 03/11/2024 1052   PROT 7.0 03/11/2024 1052   ALBUMIN 4.6 03/11/2024 1052   AST 22 03/11/2024 1052   ALT 18 03/11/2024 1052   ALKPHOS 54 03/11/2024 1052   BILITOT 0.8 03/11/2024 1052   GFRNONAA 87 02/26/2020 1254   GFRAA 100 02/26/2020 1254    Assessment and Plan:   Debbie Marshall is a 79 y.o. y/o female has been referred for   1.  History of adenomatous colon polyps - Scheduling Colonoscopy I discussed risks of colonoscopy with patient to include risk of bleeding, colon perforation, and risk of  sedation.  Patient expressed understanding and agrees to proceed with colonoscopy.   Follow up As Needed based on Colonoscopy results.  Debbie Console, PA-C       [1]  Social History Tobacco Use   Smoking status: Never   Smokeless tobacco: Never  Vaping Use   Vaping status: Never Used  Substance Use Topics   Alcohol use: Yes    Alcohol/week: 1.0 standard drink of alcohol    Types: 1 Glasses of wine per week    Comment: rarely   Drug use: No   "

## 2024-04-29 ENCOUNTER — Encounter: Payer: Self-pay | Admitting: Physician Assistant

## 2024-04-29 ENCOUNTER — Encounter: Payer: Self-pay | Admitting: Internal Medicine

## 2024-04-29 ENCOUNTER — Ambulatory Visit (INDEPENDENT_AMBULATORY_CARE_PROVIDER_SITE_OTHER): Admitting: Physician Assistant

## 2024-04-29 VITALS — BP 140/80 | HR 80 | Ht <= 58 in | Wt 119.4 lb

## 2024-04-29 DIAGNOSIS — Z860100 Personal history of colon polyps, unspecified: Secondary | ICD-10-CM

## 2024-04-29 DIAGNOSIS — Z01818 Encounter for other preprocedural examination: Secondary | ICD-10-CM | POA: Diagnosis not present

## 2024-04-29 DIAGNOSIS — Z860101 Personal history of adenomatous and serrated colon polyps: Secondary | ICD-10-CM | POA: Diagnosis not present

## 2024-04-29 MED ORDER — NA SULFATE-K SULFATE-MG SULF 17.5-3.13-1.6 GM/177ML PO SOLN: 1.0000 | Freq: Once | ORAL | 0 refills | Status: AC

## 2024-04-29 NOTE — Progress Notes (Signed)
 Noted.

## 2024-04-29 NOTE — Patient Instructions (Signed)
 You have been scheduled for a colonoscopy. Please follow written instructions given to you at your visit today.   If you use inhalers (even only as needed), please bring them with you on the day of your procedure.  DO NOT TAKE 7 DAYS PRIOR TO TEST- Trulicity (dulaglutide) Ozempic, Wegovy (semaglutide) Mounjaro, Zepbound (tirzepatide) Bydureon Bcise (exanatide extended release)  DO NOT TAKE 1 DAY PRIOR TO YOUR TEST Rybelsus (semaglutide) Adlyxin (lixisenatide) Victoza (liraglutide) Byetta (exanatide) ___________________________________________________________________________   Follow up as needed.  Thank you for trusting me with your gastrointestinal care!   Ellouise Console, PA-C  _______________________________________________________  If your blood pressure at your visit was 140/90 or greater, please contact your primary care physician to follow up on this.  _______________________________________________________  If you are age 87 or older, your body mass index should be between 23-30. Your Body mass index is 24.95 kg/m. If this is out of the aforementioned range listed, please consider follow up with your Primary Care Provider.  If you are age 26 or younger, your body mass index should be between 19-25. Your Body mass index is 24.95 kg/m. If this is out of the aformentioned range listed, please consider follow up with your Primary Care Provider.   ________________________________________________________  The [facility] providers would like to encourage you to use MYCHART to communicate with providers for non-urgent requests or questions.  Due to long hold times on the telephone, sending your provider a message by [facility] may be a faster and more efficient way to get a response.  Please allow 48 business hours for a response.  Please remember that this is for non-urgent requests.  _______________________________________________________  [facility] is using a  team-based approach to care.  Your team is made up of your doctor and two to three APPS. Our APPS (Nurse Practitioners and Physician Assistants) work with your physician to ensure care continuity for you. They are fully qualified to address your health concerns and develop a treatment plan. They communicate directly with your gastroenterologist to care for you. Seeing the Advanced Practice Practitioners on your physician's team can help you by facilitating care more promptly, often allowing for earlier appointments, access to diagnostic testing, procedures, and other specialty referrals.

## 2024-05-01 ENCOUNTER — Telehealth: Payer: Self-pay | Admitting: Internal Medicine

## 2024-05-01 NOTE — Telephone Encounter (Signed)
 Ok.

## 2024-05-01 NOTE — Telephone Encounter (Signed)
 Good afternoon Dr. Abran,     I received a call from this patient requesting to reschedule her procedure due to the inclement weather. Patient was schedule for January the 26 th and rescheduled for February the 25 th. Please advise.    Thank you

## 2024-05-06 ENCOUNTER — Encounter: Admitting: Internal Medicine

## 2024-06-05 ENCOUNTER — Encounter: Admitting: Internal Medicine
